# Patient Record
Sex: Female | Born: 1935 | Race: White | Hispanic: No | Smoking: Never smoker
Health system: Southern US, Community
[De-identification: ages and names within clinical notes are randomized; demographics above are authoritative.]

## PROBLEM LIST (undated history)

## (undated) DIAGNOSIS — I350 Nonrheumatic aortic (valve) stenosis: Secondary | ICD-10-CM

## (undated) DIAGNOSIS — I2510 Atherosclerotic heart disease of native coronary artery without angina pectoris: Secondary | ICD-10-CM

## (undated) DIAGNOSIS — N183 Chronic kidney disease, stage 3 unspecified: Secondary | ICD-10-CM

## (undated) DIAGNOSIS — E785 Hyperlipidemia, unspecified: Secondary | ICD-10-CM

## (undated) DIAGNOSIS — S72141A Displaced intertrochanteric fracture of right femur, initial encounter for closed fracture: Secondary | ICD-10-CM

## (undated) DIAGNOSIS — S32000A Wedge compression fracture of unspecified lumbar vertebra, initial encounter for closed fracture: Secondary | ICD-10-CM

## (undated) DIAGNOSIS — M109 Gout, unspecified: Secondary | ICD-10-CM

## (undated) DIAGNOSIS — Z7901 Long term (current) use of anticoagulants: Secondary | ICD-10-CM

## (undated) DIAGNOSIS — I5032 Chronic diastolic (congestive) heart failure: Secondary | ICD-10-CM

## (undated) DIAGNOSIS — M1990 Unspecified osteoarthritis, unspecified site: Secondary | ICD-10-CM

## (undated) DIAGNOSIS — I480 Paroxysmal atrial fibrillation: Secondary | ICD-10-CM

## (undated) DIAGNOSIS — M4856XA Collapsed vertebra, not elsewhere classified, lumbar region, initial encounter for fracture: Secondary | ICD-10-CM

## (undated) DIAGNOSIS — H3530 Unspecified macular degeneration: Secondary | ICD-10-CM

## (undated) DIAGNOSIS — I10 Essential (primary) hypertension: Secondary | ICD-10-CM

## (undated) DIAGNOSIS — R011 Cardiac murmur, unspecified: Secondary | ICD-10-CM

## (undated) DIAGNOSIS — J189 Pneumonia, unspecified organism: Secondary | ICD-10-CM

## (undated) DIAGNOSIS — S72001A Fracture of unspecified part of neck of right femur, initial encounter for closed fracture: Secondary | ICD-10-CM

## (undated) HISTORY — DX: Essential (primary) hypertension: I10

## (undated) HISTORY — DX: Chronic kidney disease, stage 3 unspecified: N18.30

## (undated) HISTORY — DX: Fracture of unspecified part of neck of right femur, initial encounter for closed fracture: S72.001A

## (undated) HISTORY — DX: Long term (current) use of anticoagulants: Z79.01

## (undated) HISTORY — DX: Chronic diastolic (congestive) heart failure: I50.32

## (undated) HISTORY — DX: Chronic kidney disease, stage 3 (moderate): N18.3

## (undated) HISTORY — DX: Hyperlipidemia, unspecified: E78.5

## (undated) HISTORY — PX: TONSILLECTOMY: SUR1361

## (undated) HISTORY — DX: Paroxysmal atrial fibrillation: I48.0

## (undated) HISTORY — DX: Atherosclerotic heart disease of native coronary artery without angina pectoris: I25.10

## (undated) HISTORY — DX: Unspecified macular degeneration: H35.30

## (undated) HISTORY — DX: Nonrheumatic aortic (valve) stenosis: I35.0

---

## 1998-03-28 HISTORY — PX: CHOLECYSTECTOMY: SHX55

## 2007-04-06 ENCOUNTER — Ambulatory Visit: Payer: Self-pay | Admitting: Internal Medicine

## 2007-04-28 ENCOUNTER — Encounter: Payer: Self-pay | Admitting: Internal Medicine

## 2007-05-26 ENCOUNTER — Encounter: Payer: Self-pay | Admitting: Internal Medicine

## 2007-07-12 ENCOUNTER — Ambulatory Visit: Payer: Self-pay | Admitting: Internal Medicine

## 2007-11-16 ENCOUNTER — Encounter: Payer: Self-pay | Admitting: Internal Medicine

## 2007-11-30 ENCOUNTER — Encounter: Payer: Self-pay | Admitting: Internal Medicine

## 2007-12-20 ENCOUNTER — Ambulatory Visit: Payer: Self-pay | Admitting: Internal Medicine

## 2007-12-26 ENCOUNTER — Encounter: Payer: Self-pay | Admitting: Internal Medicine

## 2008-03-05 ENCOUNTER — Emergency Department: Payer: Self-pay | Admitting: Emergency Medicine

## 2008-08-23 ENCOUNTER — Ambulatory Visit: Payer: Self-pay | Admitting: Internal Medicine

## 2008-12-18 ENCOUNTER — Ambulatory Visit: Payer: Self-pay | Admitting: Internal Medicine

## 2009-02-05 ENCOUNTER — Ambulatory Visit: Payer: Self-pay | Admitting: Internal Medicine

## 2009-03-09 ENCOUNTER — Encounter: Payer: Self-pay | Admitting: Cardiovascular Disease

## 2009-03-09 LAB — CONVERTED CEMR LAB: Prothrombin Time: 20.2 s

## 2009-03-22 ENCOUNTER — Encounter: Payer: Self-pay | Admitting: Cardiovascular Disease

## 2009-03-28 ENCOUNTER — Encounter: Payer: Self-pay | Admitting: Cardiovascular Disease

## 2009-03-28 LAB — CONVERTED CEMR LAB: INR: 1.9

## 2009-04-05 ENCOUNTER — Encounter: Payer: Self-pay | Admitting: Cardiovascular Disease

## 2009-04-18 ENCOUNTER — Encounter: Payer: Self-pay | Admitting: Cardiovascular Disease

## 2009-04-20 ENCOUNTER — Ambulatory Visit: Payer: Self-pay | Admitting: Cardiovascular Disease

## 2009-04-20 DIAGNOSIS — E785 Hyperlipidemia, unspecified: Secondary | ICD-10-CM | POA: Insufficient documentation

## 2009-04-20 DIAGNOSIS — R079 Chest pain, unspecified: Secondary | ICD-10-CM

## 2009-04-20 DIAGNOSIS — I4891 Unspecified atrial fibrillation: Secondary | ICD-10-CM | POA: Insufficient documentation

## 2009-04-20 DIAGNOSIS — I10 Essential (primary) hypertension: Secondary | ICD-10-CM

## 2009-04-20 DIAGNOSIS — R609 Edema, unspecified: Secondary | ICD-10-CM

## 2009-05-10 ENCOUNTER — Encounter: Payer: Self-pay | Admitting: Cardiovascular Disease

## 2009-05-10 ENCOUNTER — Ambulatory Visit: Payer: Self-pay

## 2009-05-16 ENCOUNTER — Telehealth: Payer: Self-pay | Admitting: Cardiovascular Disease

## 2009-05-21 ENCOUNTER — Encounter: Payer: Self-pay | Admitting: Cardiovascular Disease

## 2009-05-22 ENCOUNTER — Encounter: Payer: Self-pay | Admitting: Cardiovascular Disease

## 2009-05-23 ENCOUNTER — Telehealth: Payer: Self-pay | Admitting: Cardiovascular Disease

## 2009-05-24 ENCOUNTER — Ambulatory Visit: Payer: Self-pay | Admitting: Internal Medicine

## 2009-05-28 ENCOUNTER — Ambulatory Visit: Payer: Self-pay | Admitting: Cardiovascular Disease

## 2009-05-31 ENCOUNTER — Telehealth: Payer: Self-pay | Admitting: Cardiovascular Disease

## 2009-06-14 ENCOUNTER — Ambulatory Visit: Payer: Self-pay | Admitting: Cardiology

## 2009-06-14 ENCOUNTER — Telehealth: Payer: Self-pay | Admitting: Cardiovascular Disease

## 2009-06-18 ENCOUNTER — Telehealth: Payer: Self-pay | Admitting: Cardiovascular Disease

## 2009-06-20 ENCOUNTER — Telehealth: Payer: Self-pay | Admitting: Cardiovascular Disease

## 2009-06-25 ENCOUNTER — Encounter: Payer: Self-pay | Admitting: Cardiovascular Disease

## 2009-06-26 ENCOUNTER — Telehealth: Payer: Self-pay | Admitting: Cardiovascular Disease

## 2009-06-27 ENCOUNTER — Encounter: Payer: Self-pay | Admitting: Cardiovascular Disease

## 2009-06-28 ENCOUNTER — Ambulatory Visit: Payer: Self-pay | Admitting: Cardiovascular Disease

## 2009-07-02 ENCOUNTER — Encounter: Payer: Self-pay | Admitting: Cardiovascular Disease

## 2009-07-03 ENCOUNTER — Encounter: Payer: Self-pay | Admitting: Cardiovascular Disease

## 2009-07-03 ENCOUNTER — Ambulatory Visit: Payer: Self-pay

## 2009-07-26 ENCOUNTER — Inpatient Hospital Stay: Payer: Self-pay | Admitting: Internal Medicine

## 2009-07-26 ENCOUNTER — Ambulatory Visit: Payer: Self-pay | Admitting: Cardiovascular Disease

## 2009-07-28 ENCOUNTER — Encounter: Payer: Self-pay | Admitting: Pulmonary Disease

## 2009-07-28 ENCOUNTER — Encounter: Payer: Self-pay | Admitting: Cardiovascular Disease

## 2009-07-30 ENCOUNTER — Encounter: Payer: Self-pay | Admitting: Cardiovascular Disease

## 2009-08-01 ENCOUNTER — Encounter: Payer: Self-pay | Admitting: Cardiovascular Disease

## 2009-08-01 LAB — CONVERTED CEMR LAB: POC INR: 3.6

## 2009-08-02 ENCOUNTER — Encounter: Payer: Self-pay | Admitting: Cardiovascular Disease

## 2009-08-03 ENCOUNTER — Telehealth: Payer: Self-pay | Admitting: Cardiovascular Disease

## 2009-08-06 ENCOUNTER — Encounter: Payer: Self-pay | Admitting: Cardiovascular Disease

## 2009-08-10 ENCOUNTER — Telehealth: Payer: Self-pay | Admitting: Cardiovascular Disease

## 2009-08-16 ENCOUNTER — Ambulatory Visit: Payer: Self-pay | Admitting: Pulmonary Disease

## 2009-08-16 ENCOUNTER — Encounter: Payer: Self-pay | Admitting: Pulmonary Disease

## 2009-08-16 DIAGNOSIS — R0602 Shortness of breath: Secondary | ICD-10-CM

## 2009-08-23 ENCOUNTER — Ambulatory Visit: Payer: Self-pay | Admitting: Cardiovascular Disease

## 2009-08-23 ENCOUNTER — Encounter: Payer: Self-pay | Admitting: Cardiovascular Disease

## 2009-08-24 ENCOUNTER — Encounter: Payer: Self-pay | Admitting: Cardiovascular Disease

## 2009-09-12 ENCOUNTER — Ambulatory Visit: Payer: Self-pay | Admitting: Cardiovascular Disease

## 2009-10-17 ENCOUNTER — Ambulatory Visit: Payer: Self-pay | Admitting: Cardiovascular Disease

## 2009-11-15 ENCOUNTER — Ambulatory Visit: Payer: Self-pay | Admitting: Cardiovascular Disease

## 2009-11-15 LAB — CONVERTED CEMR LAB: INR: 3

## 2009-12-12 ENCOUNTER — Ambulatory Visit: Payer: Self-pay | Admitting: Internal Medicine

## 2009-12-12 LAB — CONVERTED CEMR LAB: POC INR: 2.4

## 2010-01-02 ENCOUNTER — Ambulatory Visit: Payer: Self-pay | Admitting: Cardiology

## 2010-01-02 LAB — CONVERTED CEMR LAB: POC INR: 2.6

## 2010-01-09 ENCOUNTER — Ambulatory Visit: Payer: Self-pay | Admitting: Ophthalmology

## 2010-01-30 ENCOUNTER — Ambulatory Visit: Payer: Self-pay | Admitting: Cardiovascular Disease

## 2010-01-30 LAB — CONVERTED CEMR LAB: POC INR: 2.2

## 2010-02-08 ENCOUNTER — Encounter: Payer: Self-pay | Admitting: Cardiovascular Disease

## 2010-02-08 ENCOUNTER — Ambulatory Visit: Payer: Self-pay | Admitting: Cardiovascular Disease

## 2010-02-13 ENCOUNTER — Encounter: Payer: Self-pay | Admitting: Cardiovascular Disease

## 2010-02-19 ENCOUNTER — Encounter: Payer: Self-pay | Admitting: Cardiovascular Disease

## 2010-02-21 ENCOUNTER — Telehealth: Payer: Self-pay | Admitting: Cardiovascular Disease

## 2010-03-01 ENCOUNTER — Ambulatory Visit: Admission: RE | Admit: 2010-03-01 | Discharge: 2010-03-01 | Payer: Self-pay | Source: Home / Self Care

## 2010-03-04 ENCOUNTER — Telehealth: Payer: Self-pay | Admitting: Cardiovascular Disease

## 2010-03-26 NOTE — Medication Information (Signed)
Summary: CCR/NE  Anticoagulant Therapy  Managed by: Cloyde Reams, RN, BSN Referring MD: Mariah Milling PCP: Levell July Supervising MD: Mariah Milling Indication 1: Atrial Fibrillation Lab Used: [facility] Site: [place] INR POC 2.4 INR RANGE 2.0-3.0  Dietary changes: no    Health status changes: no    Bleeding/hemorrhagic complications: no    Recent/future hospitalizations: no    Any changes in medication regimen? no    Recent/future dental: no  Any missed doses?: yes       Is patient compliant with meds? yes       Allergies: No Known Drug Allergies  Anticoagulation Management History:      The patient is taking warfarin and comes in today for a routine follow up visit.  Positive risk factors for bleeding include an age of 75 years or older.  The bleeding index is 'intermediate risk'.  Positive CHADS2 values include History of HTN and Age > 51 years old.  Her last INR was 2.0.  Anticoagulation responsible provider: Fatimah Sundquist.  INR POC: 2.4.  Cuvette Lot#: [id number].  Exp: 09/2010.    Anticoagulation Management Assessment/Plan:      The target INR is 2.0-3.0.  The next INR is due 10/17/2009.  Anticoagulation instructions were given to patient.  Results were reviewed/authorized by Cloyde Reams, RN, BSN.  She was notified by Cloyde Reams RN.         Prior Anticoagulation Instructions: INR 2.4  Continue taking 1 tab daily except for 1.5 on Wednesday. Recheck in 3 weeks.   Current Anticoagulation Instructions: INR 2.4  Continue same dosage 2mg  daily except 3mg  on Wednesdays.  Recheck in 4 weeks.

## 2010-03-26 NOTE — Assessment & Plan Note (Signed)
Summary: consult for dyspnea   Copy to:  Duncan Dull Primary Provider/Referring Provider:  Levell July  CC:  Pulmonary Consult.  History of Present Illness: The pt comes in today evaluation of hypoxemia and dyspnea.  She was recently in the hospital at [facility] the beginning of the month for cough, dyspnea, and wheezing.  She was found to have hypoxemia, and was treated as an inpt for 3 days.  I do not have those records regarding treatment or diagnoses.  Since being out of the hospital, she feels that she is much improved and getting stronger.  She has minimal cough, no congestion, and no mucus.  She feels that her breathing is near her usual baseline.  She denies any issues with her ADL's or groceries, but does get winded with heavy exertion.  She has noted some LE edema.  She has no prior h/o lung disease, but does have afib and h/o chf.  Her recent echo shows nl EF with ?diastolic dysfunction, mild LA dilatation, mild MR, moderate AS, and mild increase in PA pressures.  I have a cxr report from 6/3 which shows hyperinflation and question raised re: thickened IS markings.    Current Medications (verified): 1)  Amlodipine Besylate 5 Mg Tabs (Amlodipine Besylate) .... Take One Tablet By Mouth Daily 2)  Simvastatin 80 Mg Tabs (Simvastatin) .... Take One Tablet By Mouth Daily At Bedtime 3)  Warfarin Sodium 2.5 Mg Tabs (Warfarin Sodium) .... Use As Directed By Anticoagualtion Clinic 4)  Furosemide 40 Mg Tabs (Furosemide) .... Take One Tablet By Mouth Daily 5)  Propafenone Hcl 225 Mg Tabs (Propafenone Hcl) .... Three Times A Day 6)  Metoprolol Succinate 50 Mg Xr24h-Tab (Metoprolol Succinate) .... Take One Tablet By Mouth Daily 7)  Ocuvite  Tabs (Multiple Vitamins-Minerals) .... Once Daily 8)  Vitamin D 400 Unit  Tabs (Cholecalciferol) .... Once Daily 9)  Gabapentin 300 Mg Caps (Gabapentin) .... As Needed 10)  Alendronate Sodium 70 Mg Tabs (Alendronate Sodium) .... Weekly  Allergies  (verified): No Known Drug Allergies  Past History:  Past Medical History: Reviewed history from 05/10/2009 and no changes required. Atrial Fibrillation Hypertension Hyperlipidemia Mild to moderate aortic stenosis with estimated valve area of 1.2 cm in 2007 Macular degeneration Chronic renal insufficency  Past Surgical History: Cholecystectomy Mar 28, 1998  Family History: Reviewed history from 05/10/2009 and no changes required. 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 Tobacco Use - Never. Alcohol Use - yes Regular Exercise - no Drug Use - no  Vital Signs:  Patient profile:   77 year old female Height:      65 inches Weight:      171 pounds BMI:     28.56 O2 Sat:      95 % on Room air Temp:     98.2 degrees F oral Pulse rate:   60 / minute BP sitting:   136 / 66  (left arm) Cuff size:   regular  Vitals Entered By: Arman Filter LPN (August 16, 2009 11:59 AM)  O2 Flow:  Room air  Serial Vital Signs/Assessments:  Comments: 12:30pm Ambulatory Pulse Oximetry  Resting; HR__84___    02 Sat___95%ra__  Lap1 (185 feet)   HR_69____   02 Sat__90%ra__ Lap2 (185 feet)   HR_66____   02 Sat___86%ra__    Lap3 (185 feet)   HR_____   02 Sat_____  ___Test Completed without Difficulty _X__Test Stopped due to:  decrease in o2 sats. Arman Filter LPN  August 16, 2009 2:31 PM    By: Arman Filter LPN   CC: Pulmonary Consult Comments Medications reviewed with patient Arman Filter LPN  August 16, 2009 11:59 AM    Physical Exam  General:  ow female in nad frail Eyes:  PERRLA and EOMI.   Nose:  patent without discharge Mouth:  clear Neck:  no jvd, tmg, LN Lungs:  surprisingly, totally clear to auscultation no wheezing or crackles Heart:  rrr, 3/6 sem Abdomen:  soft and nontender, bs+ Extremities:  1+ edema, mild varicosities pulses  intact distally Neurologic:  alert and oriented, moves all 4.   Impression & Recommendations:  Problem # 1:  DYSPNEA (ICD-786.05) the pt is getting over a recent hospitalization and feels that she is much improved.  She is getting her strength back, and denies any cough, congestion, or mucus.  She has excellent sats at rest, but does desat with ambulation here in the office on the last lap.  Her cxr today is clear.  At this point, would lean toward giving the pt more time to recover over the next 4-8 weeks.  If she continues to have issues, we can start her on oxygen with exertion, and can check pfts to look for obstructive lung disease.  She has never smoked, and does not have h/o asthma.  However, she may have an element of senile emphysema.  Obviously, her cardiac disease contributes to this problem as well, and also her frailty.  I have discussed the above with Dr. Darrick Huntsman, who agrees with plan as outlined.  Medications Added to Medication List This Visit: 1)  Furosemide 40 Mg Tabs (Furosemide) .... Take one tablet by mouth daily  Other Orders: Consultation Level IV (35009) T-2 View CXR (71020TC)   Patient Instructions: 1)  will check cxr today, and call you with results 2)  will speak with Dr. Darrick Huntsman about your case, and call you with update.    Appended Document: consult for dyspnea received records from [facility] regarding recent hospitalization....BNP > 3000.  suspect chf more of an issue for her than copd.

## 2010-03-26 NOTE — Medication Information (Signed)
Summary: Pending eye surgery/ needs INR faxed to [facility]  Anticoagulant Therapy  Managed by: Weston Brass, PharmD Referring MD: Mariah Milling PCP: Levell July Supervising MD: Shirlee Latch MD, [place] Indication 1: Atrial Fibrillation Lab Used: [facility] Site: [place] INR POC 2.6 INR RANGE 2.0-3.0   Health status changes: no    Bleeding/hemorrhagic complications: no    Recent/future hospitalizations: no    Any changes in medication regimen? no    Recent/future dental: no  Any missed doses?: no       Is patient compliant with meds? yes       Allergies: No Known Drug Allergies  Anticoagulation Management History:      The patient is taking warfarin and comes in today for a routine follow up visit.  Positive risk factors for bleeding include an age of 75 years or older.  The bleeding index is 'intermediate risk'.  Positive CHADS2 values include History of HTN and Age > 75 years old.  Her last INR was 3.0.  Anticoagulation responsible provider: Shirlee Latch MD, [place].  INR POC: 2.6.  Cuvette Lot#: [id number].  Exp: 11/2010.    Anticoagulation Management Assessment/Plan:      The target INR is 2.0-3.0.  The next INR is due 01/30/2010.  Anticoagulation instructions were given to patient.  Results were reviewed/authorized by Weston Brass, PharmD.  She was notified by Hoy Register, PharmD Candidat.         Prior Anticoagulation Instructions: INR 2.4  Continue on same dosage 1 tablet daily except 1.5 tablets on Wednesdays.  Recheck in 4 weeks.    Current Anticoagulation Instructions: INR 2.6 Continue previous dose of 2 mg everyday except 3 mg on Wednesday Recheck INR in 4 weeks

## 2010-03-26 NOTE — Letter (Signed)
Summary: Medical Record Release  Medical Record Release   Imported By: Harlon Flor 04/24/2009 15:14:49  _____________________________________________________________________  External Attachment:    Type:   Image     Comment:   External Document

## 2010-03-26 NOTE — Medication Information (Signed)
Summary: ccr  Anticoagulant Therapy  Managed by: Cloyde Reams, RN, BSN Referring MD: Mariah Milling PCP: Levell July Supervising MD: Mariah Milling Indication 1: Atrial Fibrillation Lab Used: [facility] Site:  INR RANGE 2.0-3.0  Dietary changes: no    Health status changes: no    Bleeding/hemorrhagic complications: no    Recent/future hospitalizations: no    Any changes in medication regimen? no    Recent/future dental: no  Any missed doses?: no       Is patient compliant with meds? yes       Allergies: No Known Drug Allergies  Anticoagulation Management History:      The patient is taking warfarin and comes in today for a routine follow up visit.  Positive risk factors for bleeding include an age of 4 years or older.  The bleeding index is 'intermediate risk'.  Positive CHADS2 values include History of HTN and Age > 77 years old.  Her last INR was 2.0 and today's INR is 3.0.  Anticoagulation responsible provider: Goll[facility].  Cuvette Lot#: [id number].  Exp: 11/2010.    Anticoagulation Management Assessment/Plan:      The target INR is 2.0-3.0.  The next INR is due 12/12/2009.  Anticoagulation instructions were given to patient.  Results were reviewed/authorized by Cloyde Reams, RN, BSN.  She was notified by Benedict Needy, RN.         Prior Anticoagulation Instructions: INR 3.0  1mg  today, then resume same dosage 2mg  daily except 3mg  on Wednesdays.  Recheck in 4 weeks.    Current Anticoagulation Instructions: INR 3.0  Continue taking 1 tab daily except for 1.5 tabs on Wednesday. Recheck in 4 weeks.

## 2010-03-26 NOTE — Progress Notes (Signed)
Summary: PROBLEMS  Phone Note Call from Patient Call back at Home Phone [phone]   Caller: SELF Call For: Madeline Perez Summary of Call: HAVING IRREGULAR HEARTBEAT-DOES SHE NEED TO BE SEEN? Initial call taken by: Harlon Flor,  June 14, 2009 9:16 AM  Follow-up for Phone Call        heart beat is not steady, feeling it skipping beat, then it gets ok, going on for several days, different from the past episodes. told pt to come in around 1115 to get an ekg to make sure there is no changes. she was good with this.  Follow-up by: Mercer Pod,  June 14, 2009 9:35 AM

## 2010-03-26 NOTE — Miscellaneous (Signed)
Summary: Orders Update  Clinical Lists Changes  Orders: Added new Test order of Venous Duplex Lower Extremity (Venous Duplex Lower) - Signed 

## 2010-03-26 NOTE — Assessment & Plan Note (Signed)
Summary: ROV/GLC   Visit Type:  Follow-up Referring Provider:  Duncan Dull Primary Provider:  Levell July  CC:  Would like to discuss Aortic Stenosis.Madeline Perez  History of Present Illness: Madeline Perez is a 77 year old woman with history of paroxysmal atrial fibrillation, moderate aortic stenosis, chronic lower extremity edema, chronic renal insufficiency, Diastolic dysfunction, pulmonary hypertension who presents for Routine followup.  Madeline Perez was admitted to the hospital in early June for shortness of breath, COPD exacerbation, pneumonia. She states that she feels better now. She is very concerned about her aortic valve and presents today to discuss what she needs to do. She is not very active, sits at home most of the time. Her husband states that she reads too much. He would like her to lose some weight and get out and exercise. INR in the hospital was 4.5 on June 5. Creatinine was 1.7, BUN 31. Creatinine on admission was 1.3 though this did climb with diuresis.BNP was 3248 and her creatinine was 1.38, BUN 21.  echocardiogram done in 07/2009 Moderate aortic valve stenosis with mildly elevated right ventricular systolic pressure consistent with mild pulmonary hypertension. Mean aortic valve gradient is 21 mmHg, peak gradient of 42 mmHg, estimated aortic valve area 1.0 cm, normal LV and RV size and function, mild to moderate aortic valve regurgitation, mildly dilated left atrium and right atrium. Evidence of diastolic relaxation abnormality.   aortic valve area was 1.2 cm in 2007.    Current Medications (verified): 1)  Amlodipine Besylate 5 Mg Tabs (Amlodipine Besylate) .... Take One Tablet By Mouth Daily 2)  Simvastatin 80 Mg Tabs (Simvastatin) .... Take One Tablet By Mouth Daily At Bedtime 3)  Warfarin Sodium 1 Mg Tabs (Warfarin Sodium) .... Use As Directed By Anticoagualtion Clinic 4)  Furosemide 40 Mg Tabs (Furosemide) .... Take One Tablet By Mouth Daily 5)  Propafenone Hcl 225 Mg  Tabs (Propafenone Hcl) .... Three Times A Day 6)  Metoprolol Succinate 50 Mg Xr24h-Tab (Metoprolol Succinate) .... Take One Tablet By Mouth Daily 7)  Ocuvite  Tabs (Multiple Vitamins-Minerals) .... Once Daily 8)  Vitamin D 400 Unit  Tabs (Cholecalciferol) .... Once Daily 9)  Gabapentin 300 Mg Caps (Gabapentin) .... As Needed 10)  Alendronate Sodium 70 Mg Tabs (Alendronate Sodium) .... 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 Tobacco Use - Never. Alcohol Use - yes Regular Exercise - no Drug Use - no  Risk Factors: Exercise: no (04/20/2009)  Risk Factors: Smoking Status: never (04/20/2009)  Review of Systems       The patient complains of dyspnea on exertion and peripheral edema.  The patient denies fever, weight loss, weight gain, vision loss, decreased hearing, hoarseness, chest pain, syncope, prolonged cough, abdominal pain, incontinence, muscle weakness, depression, and enlarged lymph nodes.         Fatigue  Vital Signs:  Patient profile:   77 year old female Height:      65 inches Weight:      172 pounds BMI:     28.73 Pulse  rate:   64 / minute BP sitting:   137 / 68  (right arm) Cuff size:   regular  Vitals Entered By: Bishop Dublin, CMA (August 23, 2009 3:38 PM)  Physical Exam  General:  Well developed, well nourished, in no acute distress. Head:  normocephalic and atraumatic Neck:  Neck  supple, no JVD. No masses, thyromegaly or abnormal cervical nodes. Lungs:  Clear bilaterally to auscultation and percussion. Heart:  Non-displaced PMI, chest non-tender; regular rate and rhythm, S1, S2 with II/VI SEM murmur, no rubs or gallops. Carotid upstroke normal, no bruit.  Pedals normal pulses. 1+ edema, no varicosities. Abdomen:  Bowel sounds positive; abdomen soft and non-tender without masses Msk:  Back normal, normal gait. Muscle strength and tone normal. Pulses:  pulses normal in all 4 extremities Extremities:  No clubbing or cyanosis. Neurologic:  Alert and oriented x 3. Skin:  Intact without lesions or rashes. Psych:  Normal affect.   Impression & Recommendations:  Problem # 1:  AORTIC STENOSIS (ICD-424.1) we talked at length about her aortic stenosis. It is now close to moderate. Aortic valve area of 1 cm. gradient is also consistent with moderate stenosis. She does have renal dysfunction, pulmonary hypertension, diastolic dysfunction. All of these will contribute to her symptoms of shortness of breath and edema. I suspect that we will need to run her creatinine at around 1.5-1.8 to help her edema and breathing.  I suggested that she stay on her same milligram of Lasix and add an additional dose if her weight goes up or she has worsening edema. She is starting to have some slight skin breakdown on her legs.  Her updated medication list for this problem includes:    Furosemide 40 Mg Tabs (Furosemide) .Madeline Perez... Take one tablet by mouth daily    Metoprolol Succinate 50 Mg Xr24h-tab (Metoprolol succinate) .Madeline Perez... Take one tablet by mouth daily  Problem # 2:  HYPERLIPIDEMIA-MIXED (ICD-272.4) Given the new recommendations on simvastatin, we will suggest that she decrease her dose to 20 mg daily. She is on amlodipine as well which lowers the dose. If we do not have good enough  lipid control with the low dose, we could change to Lipitor when it goes generic this winter.  Her updated  medication list for this problem includes:    Simvastatin 80 Mg Tabs (Simvastatin) .Madeline Perez... Take one tablet by mouth daily at bedtime  Problem # 3:  DYSPNEA (ICD-786.05) Her shortness of breath is due to her underlying aortic stenosis, diastolic dysfunction, atrial fibrillation, deconditioned state and pulmonary hypertension. This will be managed with diuresis with close monitoring of her creatinine.  Her updated medication list for this problem includes:    Amlodipine Besylate 5 Mg Tabs (Amlodipine besylate) .Madeline Perez... Take one tablet by mouth daily    Furosemide 40 Mg Tabs (Furosemide) .Madeline Perez... Take one tablet by mouth daily    Metoprolol Succinate 50 Mg Xr24h-tab (Metoprolol succinate) .Madeline Perez... Take one tablet by mouth daily  Patient Instructions: 1)  Your physician recommends that you continue on your current medications as directed. Please refer to the Current Medication list given to you today. 2)  Your physician wants you to follow-up in: 6 months.   You will receive a reminder letter in the mail two months in advance. If you don't receive a letter, please call our office to schedule the follow-up appointment.

## 2010-03-26 NOTE — Miscellaneous (Signed)
  Clinical Lists Changes  Medications: Changed medication from SIMVASTATIN 80 MG TABS (SIMVASTATIN) Take one tablet by mouth daily at bedtime to SIMVASTATIN 20 MG TABS (SIMVASTATIN) Take one tablet by mouth daily at bedtime - Signed Rx of SIMVASTATIN 20 MG TABS (SIMVASTATIN) Take one tablet by mouth daily at bedtime;  #30 x 6;  Signed;  Entered by: Benedict Needy, RN;  Authorized by: Dossie Arbour MD;  Method used: [id number] [address], [place], [place]  [address], Ph: [phone], Fax: [phone]    Prescriptions: SIMVASTATIN 20 MG TABS (SIMVASTATIN) Take one tablet by mouth daily at bedtime  #30 x 6   Entered by:   Benedict Needy, RN   Authorized by:   Dossie Arbour MD   Signed by:   Benedict Needy, RN on 08/24/2009   Method used:   Electronically to        [id number] ([facility]       [address]       [place], [place]  [address]       Ph: [phone]       Fax: [phone]   RxID:   [id number]

## 2010-03-26 NOTE — Progress Notes (Signed)
Summary: PT INR  Phone Note Call from Patient Call back at Home Phone [phone]   Caller: SELF Call For: Eulises Kijowski Summary of Call: PT STATES THAT SHE HAD HER PT INR DRAWN YESTERDAY AT [facility]-I DO NOT SEE WHERE WE HAVE RECEIVED THE RESULTS-MIGHT WANT TO CALL [facility] TO GET THEM Initial call taken by: Harlon Flor,  Jun 26, 2009 12:43 PM

## 2010-03-26 NOTE — Assessment & Plan Note (Signed)
Summary: ekg  Nurse Visit   Allergies: No Known Drug Allergies  Orders Added: 1)  EKG w/ Interpretation [93000]

## 2010-03-26 NOTE — Progress Notes (Signed)
Summary: PT lab draw issue  Phone Note Outgoing Call   Summary of Call: called pt to f/u on PT/ INR lab.  Pt had been to [facility] on 4/7 for labs from PCP and thought that they were drawing a PT as well.  Per [facility] sample was not drawn for PT.  Pt is currently out of town but will have lab drawn when she returns.   Initial call taken by: Charlena Cross, RN, BSN,  June 20, 2009 10:13 AM

## 2010-03-26 NOTE — Medication Information (Signed)
Summary: rov/nb  Anticoagulant Therapy  Managed by: Bethena Midget, RN, BSN Referring MD: Mariah Milling PCP: Levell July Supervising MD: Mariah Milling Indication 1: Atrial Fibrillation Lab Used: [facility] Site: [place] INR POC 2.2 INR RANGE 2.0-3.0  Dietary changes: no    Health status changes: no    Bleeding/hemorrhagic complications: no    Recent/future hospitalizations: no    Any changes in medication regimen? no    Recent/future dental: no  Any missed doses?: no       Is patient compliant with meds? yes       Allergies: No Known Drug Allergies  Anticoagulation Management History:      The patient is taking warfarin and comes in today for a routine follow up visit.  Positive risk factors for bleeding include an age of 70 years or older.  The bleeding index is 'intermediate risk'.  Positive CHADS2 values include History of HTN and Age > 63 years old.  Her last INR was 3.0.  Anticoagulation responsible provider: [facility].  INR POC: 2.2.  Cuvette Lot#: [id number].  Exp: 01/2011.    Anticoagulation Management Assessment/Plan:      The patient's current anticoagulation dose is Warfarin sodium 2 mg tabs: Use as directed by Anticoagualtion Clinic.  The target INR is 2.0-3.0.  The next INR is due 02/27/2010.  Anticoagulation instructions were given to patient.  Results were reviewed/authorized by Bethena Midget, RN, BSN.  She was notified by Bethena Midget, RN, BSN.         Prior Anticoagulation Instructions: INR 2.6 Continue previous dose of 2 mg everyday except 3 mg on Wednesday Recheck INR in 4 weeks   Current Anticoagulation Instructions: INR 2.2 Continue 1 pill everyday except 1.5 pills on Wednesdays. Recheck in 4 weeks.

## 2010-03-26 NOTE — Assessment & Plan Note (Signed)
Summary: rov   Referring Provider:  Mariah Milling Primary Provider:  Levell July  CC:  Patient have no complaint of SOB or chest pain  or discomfort. Patient have being experiencing edema in both ankle and feet. No edema in her hands. Patient have concern about skipping heart beat latter part of the day. When I checked pulse the pulse had a slow beat. Patient declined to take a EKG reading stated she had one 2 week ago here at the office.  History of Present Illness: [name] is a 77 year old woman with history of paroxysmal atrial fibrillation, moderate aortic stenosis, chronic lower extremity edema, chronic renal insufficiency who presents for Routine followup.  she states that she has palpitations in the afternoon. She has not had any atrial fibrillation as far she knows since 2003. She did [facility] a [facility] 2011 and this showed frequent PVCs and occasional PACs but likely caused her palpitations.  She has been taking Lasix every other day and states having significant lower extremity edema. She does not drink much and does not have much salt per her report. She does have problems with incontinence and does not want to take Lasix every day.  echocardiogram done last week is as below; There was mild concentric hypertrophy. Systolic function was low normal. The estimated ejection fraction was in the range of 50% to 55%.  Doppler parameters are consistent with abnormal left ventricular relaxation (grade 1 diastolic dysfunction). Aortic valve: There was moderate stenosis. Mild to  moderate regurgitation. Valve area: 0.98cm 2(VTI). Valve area: 0.98cm 2 (Vmax). The left atrium was moderately dilated. Systolic pressure was mildly elevated consistent with mild pulmonary HTN. PA peak pressure: 35mm Hg.   aortic valve area was 1.2 cm in 2007.   Current Medications (verified): 1)  Amlodipine Besylate 5 Mg Tabs (Amlodipine Besylate) .... Take One Tablet By Mouth  Daily 2)  Simvastatin 80 Mg Tabs (Simvastatin) .... Take One Tablet By Mouth Daily At Bedtime 3)  Warfarin Sodium 2.5 Mg Tabs (Warfarin Sodium) .... Use As Directed By Anticoagualtion Clinic 4)  Furosemide 40 Mg Tabs (Furosemide) .... Take One Tablet By Mouth Daily - On Hold 5)  Propafenone Hcl 225 Mg Tabs (Propafenone Hcl) .... Three Times A Day 6)  Metoprolol Succinate 50 Mg Xr24h-Tab (Metoprolol Succinate) .... Take One Tablet By Mouth Daily 7)  Ocuvite  Tabs (Multiple Vitamins-Minerals) .... Once Daily 8)  Vitamin D 400 Unit  Tabs (Cholecalciferol) .... Once Daily 9)  Gabapentin 300 Mg Caps (Gabapentin) .... As Needed 10)  Alendronate Sodium 70 Mg Tabs (Alendronate Sodium) .... Weekly  Allergies (verified): No Known Drug Allergies  Review of Systems       The patient complains of peripheral edema.  The patient denies fever, weight loss, weight gain, vision loss, decreased hearing, hoarseness, chest pain, syncope, dyspnea on exertion, prolonged cough, abdominal pain, incontinence, muscle weakness, depression, and enlarged lymph nodes.         Palpitations  Vital Signs:  Patient profile:   77 year old female Height:      65 inches Weight:      178.50 pounds Pulse rate:   52 / minute BP sitting:   138 / 62  (left arm) Cuff size:   large  Physical Exam  General:  well-appearing elderly woman in no apparent distress, HEENT exam is benign, oropharynx is clear, neck is supple with no JVP or carotid bruits, heart sounds are regular with S1-S2 and III/VI SEM  at RSB, lungs are clear to auscultation with no wheezes Rales, abdominal exam is benign, 1 + lower extremity edema, neurologic exam is nonfocal, skin is warm and dry. Pulses are equal and symmetrical in his upper and lower extremities.   Impression & Recommendations:  Problem # 1:  EDEMA (ICD-782.3) I suspect her edema is due to mild pulmonary hypertension and I suspect also some component of venous insufficiency. I suggested that  she stay on her Lasix dose every other day with additional doses every day as needed for worsening edema. Some of her presentation may be due to her underlying aortic valve stenosis.  She has a lower extremity venous Doppler study scheduled for next week  Problem # 2:  AORTIC STENOSIS (ICD-424.1) Aortic valve is estimated at moderate stenosis. We will repeat the echocardiogram in one year's time. Continue metoprolol for heart rate control.  Given her frequent PVCs and PACs which she feels in the afternoon, have asked her to move her metoprolol dosing from the late evening to early afternoon or at lunchtime.   Her updated medication list for this problem includes:    Furosemide 40 Mg Tabs (Furosemide) .Marland Kitchen... Take one tablet by mouth daily - on hold    Metoprolol Succinate 50 Mg Xr24h-tab (Metoprolol succinate) .Marland Kitchen... Take one tablet by mouth daily  Problem # 3:  HYPERTENSION, BENIGN (ICD-401.1) blood pressure is well controlled on today's visit. Heart rate is low.(52 bpm) Will continue to monitor her heart rate and can decrease the dose of metoprolol if she becomes symptomatic.  Her updated medication list for this problem includes:    Amlodipine Besylate 5 Mg Tabs (Amlodipine besylate) .Marland Kitchen... Take one tablet by mouth daily    Furosemide 40 Mg Tabs (Furosemide) .Marland Kitchen... Take one tablet by mouth daily - on hold    Metoprolol Succinate 50 Mg Xr24h-tab (Metoprolol succinate) .Marland Kitchen... Take one tablet by mouth daily  Appended Document: rov she is not on aspirin as she is on Coumadin for a history of a atrial fibrillation. She is a nonsmoker.

## 2010-03-26 NOTE — Procedures (Signed)
Summary: Holter and Event  Holter and Event   Imported By: Harlon Flor 06/29/2009 11:54:14  _____________________________________________________________________  External Attachment:    Type:   Image     Comment:   External Document

## 2010-03-26 NOTE — Assessment & Plan Note (Signed)
Summary: NP6/AMD   Visit Type:  new patient Referring Provider:  Mariah Milling Primary Provider:  Levell July  CC:  "sensation" going across chest.  History of Present Illness: Madeline Perez is a 77 year old woman with history of paroxysmal H. fibrillation, aortic stenosis, chronic lower extremity edema, chronic renal insufficiency who presents for evaluation of some left side chest spasm.  She has a difficult time describing her left chest sensations. She states it is superficial and not deep coma is not a chest pain though feels more like a spasm. It is not extra beats. It comes on more nighttime and keeps her awake and she did not feel it during the daytime when she is active and walking around. She is unable to feel it with pushing on the chest and reproduce the pain. She continues to have difficulty sleeping at nighttime and gabapentin 600 mg q.h.s. is not assisting with sleep. She wonders if I know of any other medications. She is also concerned about her edema and her kidney function.  It has been 4 years since she's had an echocardiogram, at that time her aortic valve area was 1.2 cm and 2007. It is uncertain if she has pulmonary hypertension  Preventive Screening-Counseling & Management  Caffeine-Diet-Exercise     Does Patient Exercise: no      Drug Use:  no.    Current Problems (verified): 1)  Hypertension, Benign  (ICD-401.1) 2)  Hyperlipidemia-mixed  (ICD-272.4) 3)  Atrial Fibrillation  (ICD-427.31)  Current Medications (verified): 1)  Amlodipine Besylate 5 Mg Tabs (Amlodipine Besylate) .... Take One Tablet By Mouth Daily 2)  Simvastatin 80 Mg Tabs (Simvastatin) .... Take One Tablet By Mouth Daily At Bedtime 3)  Warfarin Sodium 2.5 Mg Tabs (Warfarin Sodium) .... Use As Directed By Anticoagualtion Clinic 4)  Furosemide 40 Mg Tabs (Furosemide) .... Take One Tablet By Mouth Daily. 5)  Propafenone Hcl 225 Mg Tabs (Propafenone Hcl) .... Three Times A Day 6)  Metoprolol Succinate  50 Mg Xr24h-Tab (Metoprolol Succinate) .... Take One Tablet By Mouth Daily 7)  Ocuvite  Tabs (Multiple Vitamins-Minerals) .... Once Daily 8)  Vitamin D 400 Unit  Tabs (Cholecalciferol) .... Once Daily 9)  Gabapentin 300 Mg Caps (Gabapentin) .... As Needed 10)  Alendronate Sodium 70 Mg Tabs (Alendronate Sodium) .... Weekly  Allergies (verified): No Known Drug Allergies  Past History:  Past Medical History: Last updated: 04/20/2009 Atrial Fibrillation Hypertension Hyperlipidemia Mild to moderate aortic stenosis with estimated valve area of 1.2 cm in 2007  Family History: Last updated: 04/20/2009 CAD in family  Social History: Last updated: 04/20/2009 Retired  Married  Tobacco Use - No.  Alcohol Use - yes Regular Exercise - no Drug Use - no  Risk Factors: Smoking Status: never (04/20/2009)  Past Surgical History: Cholecystectomy  Social History: Retired  Married  Tobacco Use - No.  Alcohol Use - yes Regular Exercise - no Drug Use - no Does Patient Exercise:  no Drug Use:  no  Review of Systems  The patient denies anorexia, fever, weight loss, weight gain, vision loss, decreased hearing, hoarseness, chest pain, syncope, dyspnea on exertion, peripheral edema, prolonged cough, headaches, hemoptysis, abdominal pain, melena, hematochezia, severe indigestion/heartburn, hematuria, incontinence, genital sores, muscle weakness, suspicious skin lesions, transient blindness, difficulty walking, depression, unusual weight change, abnormal bleeding, and enlarged lymph nodes.         "Chest spasms"  New Orders:     1)  T-Protime, Auto ([id number])     2)  Echocardiogram (Echo)  Vital Signs:  Patient profile:   77 year old female Height:      65 inches Weight:      176 pounds BMI:     29.39 Pulse rate:   67 / minute BP sitting:   144 / 60  (left arm) Cuff size:   regular  Vitals Entered By: Hardin Negus, RMA (April 20, 2009 10:54 AM)  Physical  Exam  General:  Elderly woman in no apparent distress, alert and oriented x3, HEENT exam is benign, neck is supple with no JVP or carotid bruits, heart sounds are regular with normal S1 and S2 and III/VI murmur at the right sternal border, lungs are clear, abdominal exam benign, 1 +  lower extremity edema, neurologic exam is nonfocal,  skin is warm and dry.   EKG  Procedure date:  04/20/2009  Findings:      normal sinus rhythm, rate of 67 beats per minute, no significant ST or T wave changes, poor R wave progression through the precordial leads, unable to rule out intraseptal infarct.  Impression & Recommendations:  Problem # 1:  CHEST PAIN-UNSPECIFIED (ICD-786.50) etiology of her chest pain is uncertain. She describes it as more of a spasm and pain. It is superficial and not deep, associated with rest and on exertion. It keeps her awake at night time. It does sound like it might be musculoskeletal but not cardiac but she is concerned about her heart. We ordered a repeat echocardiogram to reevaluate her aortic valve.  Her updated medication list for this problem includes:    Amlodipine Besylate 5 Mg Tabs (Amlodipine besylate) .Marland Kitchen... Take one tablet by mouth daily    Warfarin Sodium 2.5 Mg Tabs (Warfarin sodium) ..... Use as directed by anticoagualtion clinic    Metoprolol Succinate 50 Mg Xr24h-tab (Metoprolol succinate) .Marland Kitchen... Take one tablet by mouth daily  Orders: Echocardiogram (Echo)  Problem # 2:  AORTIC STENOSIS (ICD-424.1) History of valve area of 1.2 cm it 4 years ago. We have ordered an echocardiogram to reevaluate the valve size.  Her updated medication list for this problem includes:    Furosemide 40 Mg Tabs (Furosemide) .Marland Kitchen... Take one tablet by mouth daily.    Metoprolol Succinate 50 Mg Xr24h-tab (Metoprolol succinate) .Marland Kitchen... Take one tablet by mouth daily  Orders: Echocardiogram (Echo)  Problem # 3:  EDEMA (ICD-782.3) etiology of her edema is likely due to chronic venous  stasis on uncertain if she does have pulmonary hypertension. She does have underlying renal insufficiency. The echocardiogram will help determine her biventricular systolic pressures and if she needs additional diuretic.  Problem # 4:  HYPERTENSION, BENIGN (ICD-401.1) blood pressure is reasonable well controlled on today's visit on her current medication regimen.  Her updated medication list for this problem includes:    Amlodipine Besylate 5 Mg Tabs (Amlodipine besylate) .Marland Kitchen... Take one tablet by mouth daily    Furosemide 40 Mg Tabs (Furosemide) .Marland Kitchen... Take one tablet by mouth daily.    Metoprolol Succinate 50 Mg Xr24h-tab (Metoprolol succinate) .Marland Kitchen... Take one tablet by mouth daily  Other Orders: T-Protime, Auto ([id number])  Patient Instructions: 1)  Your physician recommends that you schedule a follow-up appointment in:  2)  Your physician has requested that you have an echocardiogram.  Echocardiography is a painless test that uses sound waves to create images of your heart. It provides your doctor with information about the size and shape of your heart and how well your heart's chambers and valves are working.  This procedure takes  approximately one hour. There are no restrictions for this procedure.

## 2010-03-26 NOTE — Medication Information (Signed)
Summary: Coumadin Clinic  Anticoagulant Therapy  Managed by: Cloyde Reams, RN, BSN Referring MD: Mariah Milling PCP: Levell July Supervising MD: Mariah Milling Indication 1: Atrial Fibrillation Lab Used: [facility] [place] Site: [place] INR POC 2.4 INR RANGE 2.0-3.0   Health status changes: no    Bleeding/hemorrhagic complications: no     Any changes in medication regimen? no    Recent/future dental: no  Any missed doses?: no       Is patient compliant with meds? yes       Allergies: No Known Drug Allergies  Anticoagulation Management History:      The patient is taking warfarin and comes in today for a routine follow up visit.  Positive risk factors for bleeding include an age of 75 years or older.  The bleeding index is 'intermediate risk'.  Positive CHADS2 values include History of HTN and Age > 75 years old.  Her last INR was 2.0.  Anticoagulation responsible provider: Mcgwire Dasaro.  INR POC: 2.4.  Cuvette Lot#: [id number].  Exp: 10/2010.    Anticoagulation Management Assessment/Plan:      The patient's current anticoagulation dose is Warfarin sodium 2.5 mg tabs: Use as directed by Anticoagualtion Clinic.  The target INR is 2.0-3.0.  The next INR is due 09/12/2009.  Anticoagulation instructions were given to patient.  Results were reviewed/authorized by Cloyde Reams, RN, BSN.  She was notified by Benedict Needy, RN.         Prior Anticoagulation Instructions: INR 1.8   Take 1.5 today Then continue on 1 tab daily except 1.5 tab Wednesday. Recheck in 2 weeks.  Appointment made for 08/23/09      Current Anticoagulation Instructions: INR 2.4  Continue taking 1 tab daily except for 1.5 on Wednesday. Recheck in 3 weeks.

## 2010-03-26 NOTE — Progress Notes (Signed)
Summary: PHI  PHI   Imported By: Harlon Flor 04/24/2009 15:15:05  _____________________________________________________________________  External Attachment:    Type:   Image     Comment:   External Document

## 2010-03-26 NOTE — Progress Notes (Signed)
Summary: CALL BACK  Phone Note Call from Patient Call back at Home Phone [phone]   Caller: SELF Call For: [facility] Summary of Call: WOULD LIKE A CALL BACK FROM [facility] ABOUT A REPORT Initial call taken by: Harlon Flor,  August 03, 2009 8:58 AM  Follow-up for Phone Call        Called spoke with pt.  Pt saw Dr Darrick Huntsman yesterday had PT/INR drawn at office, unsure of results.  Dr Darrick Huntsman resumed pt's coumadin, last dose of Levaquin today, pt has 2 more days left of prednisone taper. Per pt Dr Darrick Huntsman would like for [place] to follow pt's coumadin advised pt to resume same dosage previously on 2mg  daily except 3mg  on Wednesdays, and check PT/INR in 1 week.  Pt will have redrawn on Thursday 08/09/09 and will have results sent to [place] to follow.  Follow-up by: Cloyde Reams RN,  August 03, 2009 9:21 AM

## 2010-03-26 NOTE — Progress Notes (Signed)
Summary: Problems  Phone Note Call from Patient Call back at Home Phone [phone]   Caller: Spouse Call For: MD Summary of Call: Patient's spouse called and patient was here last week for EKG for irregular heartbeat.  Husband feels that patient's meds needs to be adjusted due to either PAC's or PVC's, does patient need to come in and been seen. Initial call taken by: West Carbo,  June 18, 2009 9:29 AM  Follow-up for Phone Call        pt states that she had holter with Dr. Gwen Pounds in March.  Will contact their office and obtain copy of results.  Appt made for 5.5 with Dr. Mariah Milling.  Follow-up by: Charlena Cross, RN, BSN,  June 18, 2009 3:05 PM

## 2010-03-26 NOTE — Progress Notes (Signed)
Summary: Question  Phone Note Call from Patient Call back at Home Phone [phone]   Caller: Patient Call For: [place] Summary of Call: Patient called and wanted to know if Dr. Mariah Milling has had a chance to check with Dr. Darrick Huntsman on why she wanted her to have the test done on April 12.  Patient needs to cancel the test for now.  Wants to know if she needs to reschedule or does Dr. Mariah Milling feel that she doesn't need it. Initial call taken by: [place] Carbo,  May 31, 2009 9:32 AM  Follow-up for Phone Call        She shoudl call Dr. Darrick Huntsman. Hvae Tullo read our last office note and then she can make a decision. She ordered the test and she needs to make the decision whether to cancel. I can not cancel her test.

## 2010-03-26 NOTE — Progress Notes (Signed)
Summary: RESULTS  Phone Note Call from Patient Call back at [phone]   Caller: SELF Call For: [facility] Summary of Call: WOULD LIKE RESULTS OF THE ECHO Initial call taken by: Harlon Flor,  May 16, 2009 11:40 AM  Follow-up for Phone Call        Left message to call. Charlena Cross RN BSN   Additional Follow-up for Phone Call Additional follow up Details #1::        pt aware. Charlena Cross RN BSN

## 2010-03-26 NOTE — Medication Information (Signed)
Summary: CCR/AMD  Anticoagulant Therapy  Managed by: Cloyde Reams, RN, BSN Referring MD: Mariah Milling PCP: Levell July Supervising MD: Gala Romney MD, Reuel Boom Indication 1: Atrial Fibrillation Lab Used: [facility] [place] Site: [place] INR POC 2.4 INR RANGE 2.0-3.0  Dietary changes: no    Health status changes: no    Bleeding/hemorrhagic complications: no    Recent/future hospitalizations: no    Any changes in medication regimen? no    Recent/future dental: no  Any missed doses?: no       Is patient compliant with meds? yes       Allergies: No Known Drug Allergies  Anticoagulation Management History:      The patient is taking warfarin and comes in today for a routine follow up visit.  Positive risk factors for bleeding include an age of 75 years or older.  The bleeding index is 'intermediate risk'.  Positive CHADS2 values include History of HTN and Age > 62 years old.  Her last INR was 3.0.  Anticoagulation responsible provider: Bensimhon MD, Reuel Boom.  INR POC: 2.4.  Cuvette Lot#: [id number].  Exp: 12/2010.    Anticoagulation Management Assessment/Plan:      The target INR is 2.0-3.0.  The next INR is due 01/09/2010.  Anticoagulation instructions were given to patient.  Results were reviewed/authorized by Cloyde Reams, RN, BSN.  She was notified by Cloyde Reams RN.         Prior Anticoagulation Instructions: INR 3.0  Continue taking 1 tab daily except for 1.5 tabs on Wednesday. Recheck in 4 weeks.   Current Anticoagulation Instructions: INR 2.4  Continue on same dosage 1 tablet daily except 1.5 tablets on Wednesdays.  Recheck in 4 weeks.

## 2010-03-26 NOTE — Medication Information (Signed)
Summary: rov/ewj  Anticoagulant Therapy  Managed by: Cloyde Reams, RN, BSN Referring MD: Mariah Milling PCP: Levell July Supervising MD: Mariah Milling Indication 1: Atrial Fibrillation Lab Used: [facility] [place] Site: [place] INR POC 3.0 INR RANGE 2.0-3.0  Dietary changes: no    Health status changes: no    Bleeding/hemorrhagic complications: no    Recent/future hospitalizations: no    Any changes in medication regimen? yes       Details: Started on Augmentin and a cream for infection on lip, blistered on 10/11/09.   Recent/future dental: no  Any missed doses?: no       Is patient compliant with meds? yes       Allergies: No Known Drug Allergies  Anticoagulation Management History:      The patient is taking warfarin and comes in today for a routine follow up visit.  Positive risk factors for bleeding include an age of 75 years or older.  The bleeding index is 'intermediate risk'.  Positive CHADS2 values include History of HTN and Age > 75 years old.  Her last INR was 2.0.  Anticoagulation responsible provider: Gollan.  INR POC: 3.0.  Cuvette Lot#: [id number].  Exp: 11/2010.    Anticoagulation Management Assessment/Plan:      The target INR is 2.0-3.0.  The next INR is due 11/14/2009.  Anticoagulation instructions were given to patient.  Results were reviewed/authorized by Cloyde Reams, RN, BSN.  She was notified by Cloyde Reams RN.         Prior Anticoagulation Instructions: INR 2.4  Continue same dosage 2mg  daily except 3mg  on Wednesdays.  Recheck in 4 weeks.    Current Anticoagulation Instructions: INR 3.0  1mg  today, then resume same dosage 2mg  daily except 3mg  on Wednesdays.  Recheck in 4 weeks.

## 2010-03-26 NOTE — Progress Notes (Signed)
Summary: F/U  Phone Note Call from Patient Call back at Home Phone [phone] Call back at [phone]   Caller: SELF Call For: [facility] Summary of Call: PT HAD AN ECHO WHILE IN THE HOSPITAL-WANTS TO KNOW WHEN SHE NEEDS TO FOLLOW UP Initial call taken by: Harlon Flor,  August 10, 2009 10:18 AM  Follow-up for Phone Call        pt aware that she is due for a follow up in November but if she has any problems to call us and let us know.  Follow-up by: Benedict Needy, RN,  August 10, 2009 11:04 AM

## 2010-03-26 NOTE — Miscellaneous (Signed)
Summary: records from [facility]  Clinical Lists Changes   received records from [facility] re: pt's recent hospital stay.  put in your very important look at folder for you to review.  Aundra Millet Reynolds LPN  August 16, 2009 4:18 PM

## 2010-03-26 NOTE — Medication Information (Signed)
Summary: Coumadin Clinic  Anticoagulant Therapy  Managed by: Cloyde Reams, RN, BSN Referring MD: Mariah Milling PCP: Levell July Supervising MD: Mariah Milling Indication 1: Atrial Fibrillation Lab Used: [facility] Site: [place] PT 38.2 INR POC 3.6 INR RANGE 2.0-3.0   Health status changes: yes       Details: Pt was admitted to hospital.     Recent/future hospitalizations: yes       Details: Pt admitted to hospital last week 6/2-6/5 dx with PNA  Any changes in medication regimen? yes       Details: Levaquin and Prednisone in hospital and continues now 2 days left of Levaquin, and cont on prednisone    Any missed doses?: yes     Details: Per Dr Darrick Huntsman hold coumadin, INR to be checked tomorrow and seeing Dr Darrick Huntsman.    Is patient compliant with meds? yes       Allergies: No Known Drug Allergies  Anticoagulation Management History:      Her anticoagulation is being managed by telephone today.  Positive risk factors for bleeding include an age of 75 years or older.  The bleeding index is 'intermediate risk'.  Positive CHADS2 values include History of HTN and Age > 56 years old.  Her last INR was 2.0.  Prothrombin time is 38.2.  Anticoagulation responsible provider: Gollan.  INR POC: 3.6.    Anticoagulation Management Assessment/Plan:      The patient's current anticoagulation dose is Warfarin sodium 2.5 mg tabs: Use as directed by Anticoagualtion Clinic.  The target INR is 2.0-3.0.  The next INR is due 08/08/2009.  Anticoagulation instructions were given to patient.  Results were reviewed/authorized by Cloyde Reams, RN, BSN.  She was notified by Cloyde Reams RN.         Prior Anticoagulation Instructions: INR 2.1  Continue on same dosage 2mg  daily except 3mg  on Wednesdays. Recheck in 4 weeks.    Current Anticoagulation Instructions: INR 3.6  Holding coumadin per Dr Darrick Huntsman, pt d/c from hospital on 07/29/09 on Levaquin and Prednisone with elevated INR secondary to meds.  Seeing Dr Darrick Huntsman  tomorrow and INR check scheduled as well.  Pt will clarify if Dr Darrick Huntsman is going to follow pt's coumadin or if we will be following.

## 2010-03-26 NOTE — Assessment & Plan Note (Signed)
Summary: ROV   Visit Type:  Follow-up Referring Provider:  Mariah Milling Primary Provider:  Levell July  CC:  feeling better than was.  History of Present Illness: Ms. Schmoker is a 77 year old woman with history of paroxysmal atrial fibrillation, moderate aortic stenosis, chronic lower extremity edema, chronic renal insufficiency who presents for evaluation of shortness of breath and weight gain.  She states that over the past 10 days she has stopped her Lasix. She has been troubled by incontinence when she takes Lasix. She presented to Dr. to [employer] office and saw the physician assistant. She described wheezing, shortness of breath and some coughing. A chest x-ray and lab work was done though she is uncertain of the results. Dr. Darrick Huntsman was unable to see her at such short notice and she presents for further evaluation.  She describes increasing shortness of breath particularly with exertion. Increasing lower extremity edema more noticeable on the left lower extremity. She does have a mild cough. Her husband states that she does not walk or exercise much at all.  echocardiogram done last week is as below; There was mild concentric hypertrophy. Systolic function was low normal. The estimated ejection fraction was in the range of 50% to 55%.  Doppler parameters are consistent with abnormal left ventricular relaxation (grade 1 diastolic dysfunction). Aortic valve: There was moderate stenosis. Mild to  moderate regurgitation. Valve area: 0.98cm 2(VTI). Valve area: 0.98cm 2 (Vmax). The left atrium was moderately dilated. Systolic pressure was mildly elevated consistent with mild pulmonary HTN. PA peak pressure: 35mm Hg.    aortic valve area was 1.2 cm in 2007.   Current Problems (verified): 1)  Edema  (ICD-782.3) 2)  Chest Pain-unspecified  (ICD-786.50) 3)  Aortic Stenosis  (ICD-424.1) 4)  Hypertension, Benign  (ICD-401.1) 5)  Hyperlipidemia-mixed  (ICD-272.4) 6)  Atrial Fibrillation   (ICD-427.31)  Current Medications (verified): 1)  Amlodipine Besylate 5 Mg Tabs (Amlodipine Besylate) .... Take One Tablet By Mouth Daily 2)  Simvastatin 80 Mg Tabs (Simvastatin) .... Take One Tablet By Mouth Daily At Bedtime 3)  Warfarin Sodium 2.5 Mg Tabs (Warfarin Sodium) .... Use As Directed By Anticoagualtion Clinic 4)  Furosemide 40 Mg Tabs (Furosemide) .... Take One Tablet By Mouth Daily - On Hold 5)  Propafenone Hcl 225 Mg Tabs (Propafenone Hcl) .... Three Times A Day 6)  Metoprolol Succinate 50 Mg Xr24h-Tab (Metoprolol Succinate) .... Take One Tablet By Mouth Daily 7)  Ocuvite  Tabs (Multiple Vitamins-Minerals) .... Once Daily 8)  Vitamin D 400 Unit  Tabs (Cholecalciferol) .... Once Daily 9)  Gabapentin 300 Mg Caps (Gabapentin) .... As Needed 10)  Alendronate Sodium 70 Mg Tabs (Alendronate Sodium) .... Weekly  Allergies (verified): No Known Drug Allergies  Past History:  Past Medical History: Last updated: 05/10/2009 Atrial Fibrillation Hypertension Hyperlipidemia Mild to moderate aortic stenosis with estimated valve area of 1.2 cm in 2007 Macular degeneration Chronic renal insufficency  Past Surgical History: Last updated: 04/20/2009 Cholecystectomy  Family History: Last updated: 05/10/2009 CAD in family Father: deceased 36 with CAD Mother: deceased 29: CAD Brother: deceased with CAD  Social History: Last updated: 04/20/2009 Retired  Married  Tobacco Use - No.  Alcohol Use - yes Regular Exercise - no Drug Use - no  Risk Factors: Exercise: no (04/20/2009)  Risk Factors: Smoking Status: never (04/20/2009)  Review of Systems       The patient complains of weight gain, dyspnea on exertion, and peripheral edema.  The patient denies fatigue, malaise, fever, weight gain/loss, vision loss,  decreased hearing, hoarseness, chest pain, palpitations, shortness of breath, prolonged cough, wheezing, sleep apnea, coughing up blood, abdominal pain, blood in stool,  nausea, vomiting, diarrhea, heartburn, incontinence, blood in urine, muscle weakness, joint pain, leg swelling, rash, skin lesions, headache, fainting, dizziness, depression, anxiety, enlarged lymph nodes, easy bruising or bleeding, and environmental allergies.         mild cough and wheezing  Vital Signs:  Patient profile:   77 year old female Height:      65 inches Weight:      180.75 pounds BMI:     30.19 Pulse rate:   69 / minute Pulse rhythm:   regular BP sitting:   136 / 73  (left arm) Cuff size:   regular  Vitals Entered By: Mercer Pod (May 28, 2009 11:23 AM)  Physical Exam  General:  elderly woman in no apparent distress, alert and oriented x3, HEENT exam is benign, oropharynx is clear, neck is supple with no JVP or carotid bruits heart sounds are irregular with S1-S2 no murmurs appreciated, lungs are clear to auscultation with no wheezes or rales, abdominal exam is benign, 1+ pitting edema on the left below the knee, trace to 1+ edema on the right lower extremity to below the knee, neurologic exam grossly nonfocal, skin is warm and dry.   Impression & Recommendations:  Problem # 1:  AORTIC STENOSIS (ICD-424.1) recent echocardiogram shows slight worsening of her aortic stenosis. It is still moderate in severity. She has been holding her Lasix for the past 10 days and now has a 5-6 pound weight gain over the past several weeks.  I suspect that the fluid/weight gain is causing her symptoms of shortness of breath. Her lower extremity edema is worse. I discussed this with her and suggested she go back on her Lasix 40 mg daily. She needs to watch her fluid intake as well as her salt intake. The etiology of her edema and shortness of breath and mild pulmonary hypertension on echocardiogram is likely her aortic valve and diastolic dysfunction. She is already on metoprolol which will help with rate control to minimize fluid accumulation.  Her symptoms may get worse in the next  several years due to worsening of her aortic valve stenosis. I have discussed her urinary incontinence with her and suggested that she talk with Dr. Darrick Huntsman. From her details, it sounds like overflow incontinence. She may need to do her bladder on a more frequent basis rather than waiting for the urge to urinate.  Her updated medication list for this problem includes:    Furosemide 40 Mg Tabs (Furosemide) .Marland Kitchen... Take one tablet by mouth daily - on hold    Metoprolol Succinate 50 Mg Xr24h-tab (Metoprolol succinate) .Marland Kitchen... Take one tablet by mouth daily  Problem # 2:  EDEMA (ICD-782.3) She does have mild pulmonary hypertension on her echocardiogram and I suspect she also has some component of venous insufficiency as well. Gentle diuresis and try to figure out her dry weight will help [place] to manage her diuretis. On her last visit, her weight was 5-6 pounds less at approximately 175 pounds.  Problem # 3:  HYPERTENSION, BENIGN (ICD-401.1) blood pressure today is reasonably well controlled and no changes were made other than to add back the diuretic.  Her updated medication list for this problem includes:    Amlodipine Besylate 5 Mg Tabs (Amlodipine besylate) .Marland Kitchen... Take one tablet by mouth daily    Furosemide 40 Mg Tabs (Furosemide) .Marland Kitchen... Take one tablet by  mouth daily - on hold    Metoprolol Succinate 50 Mg Xr24h-tab (Metoprolol succinate) .Marland Kitchen... Take one tablet by mouth daily  Patient Instructions: 1)  Your physician recommends that you schedule a follow-up appointment in: 3 months 2)  Your physician has recommended you make the following change in your medication: restart lasix

## 2010-03-26 NOTE — Progress Notes (Signed)
Summary: Madeline Perez  Phone Note Call from Patient Call back at Home Phone [phone]   Caller: SELF Call For: [facility] Summary of Call: RETURNING MELISSAS CALL Initial call taken by: Harlon Flor,  May 23, 2009 8:15 AM  Follow-up for Phone Call        Left message to call. Charlena Cross RN BSN   pt aware of ccr results. Charlena Cross RN BSN

## 2010-03-28 ENCOUNTER — Other Ambulatory Visit: Payer: Self-pay

## 2010-03-28 ENCOUNTER — Ambulatory Visit: Admit: 2010-03-28 | Payer: Self-pay

## 2010-03-28 NOTE — Letter (Signed)
Summary: BP Readings  BP Readings   Imported By: Harlon Flor 02/27/2010 15:27:26  _____________________________________________________________________  External Attachment:    Type:   Image     Comment:   External Document  Appended Document: BP Readings BP looks a little high.  We could start clonidine 0.1 mg BID for HTN If she wants to start slow, could cut in 1/2 to start  Appended Document: BP Readings Notified patient need to start on clonidine 0.1 mg two times a day for HTN, told her if she wanted could cut in 1/2 to start.  She will take the full tablet and let us know if has any problems with that dose. Rx called to [facility]   Prescriptions: CLONIDINE HCL 0.1 MG TABS (CLONIDINE HCL) one tablet two times a day  #60 x 3   Entered by:   Bishop Dublin, CMA   Authorized by:   Dossie Arbour MD   Signed by:   Bishop Dublin, CMA on 02/28/2010   Method used:   Electronically to        [id number] (ret[facility])       [address]       [place], [place]  [address]       Ph: [phone]       Fax: [phone]   RxID:   [id number]

## 2010-03-28 NOTE — Progress Notes (Signed)
Summary: Edema/ d/c Amlodipine  Phone Note Outgoing Call   Call placed by: Lanny Hurst RN,  February 21, 2010 4:32 PM Summary of Call: Called pt to f/u after pt stopped Amlodipine due to edema. Pt states she has been off Amlodipine >1 week now and the swelling has decreased and her BP has generally stayed the same. Initial call taken by: Lanny Hurst RN,  February 21, 2010 4:33 PM

## 2010-03-28 NOTE — Progress Notes (Signed)
Summary: BP medication  Phone Note Call from Patient Call back at Home Phone [phone]   Caller: Self Call For: Gollan Summary of Call: Pt is having difficulty with the changes in BP medication.  Pt felt weak and faint.  Top number was 95. Initial call taken by: Harlon Flor,  March 04, 2010 9:27 AM  Follow-up for Phone Call        Pt states after changing from amlodipine to clonidine 0.1mg  two times a day her systolic BP has dropped greatly from 175 to 104 and 95 yesterday. Pt has also had dizziness with these readings. Pt stopped evening dose Sunday night.  Notified Dr. Mariah Milling, advised pt to cut clonidine in half and take two times a day and monitor bp's and let us know how this works. Pt is ok with this.  Follow-up by: Lanny Hurst RN,  March 05, 2010 2:36 PM

## 2010-03-28 NOTE — Medication Information (Signed)
Summary: rov/tm  Anticoagulant Therapy  Managed by: Lanny Hurst, RN Referring MD: Mariah Milling PCP: Duncan Dull Supervising MD: Mariah Milling Indication 1: Atrial Fibrillation Lab Used: [facility] Site: [place] INR POC 2.9 INR RANGE 2.0-3.0  Dietary changes: yes       Details: less vitk intake than normal  Health status changes: no    Bleeding/hemorrhagic complications: no    Recent/future hospitalizations: no    Any changes in medication regimen? yes       Details: changed amlodipine to clonidine  Recent/future dental: no  Any missed doses?: no       Is patient compliant with meds? yes       Allergies: No Known Drug Allergies  Anticoagulation Management History:      The patient is taking warfarin and comes in today for a routine follow up visit.  Positive risk factors for bleeding include an age of 75 years or older.  The bleeding index is 'intermediate risk'.  Positive CHADS2 values include History of HTN and Age > 75 years old.  Her last INR was 3.0.  Anticoagulation responsible provider: Leo Fray.  INR POC: 2.9.  Exp: 01/2011.    Anticoagulation Management Assessment/Plan:      The patient's current anticoagulation dose is Warfarin sodium 2 mg tabs: Use as directed by Anticoagualtion Clinic.  The target INR is 2.0-3.0.  The next INR is due 03/29/2010.  Anticoagulation instructions were given to patient.  Results were reviewed/authorized by Lanny Hurst, RN.  She was notified by Lanny Hurst RN.         Prior Anticoagulation Instructions: INR 2.2 Continue 1 pill everyday except 1.5 pills on Wednesdays. Recheck in 4 weeks.   Current Anticoagulation Instructions: INR 2.9  Advised pt to incorporate green leafy vegetables back in diet consistently. Continue same dosage Coumadin 2mg  1 tablet daily except 1 1/2 tablets on Wednesdays. Recheck in 4 weeks.

## 2010-03-28 NOTE — Miscellaneous (Signed)
Summary: Med change  Clinical Lists Changes  Medications: Changed medication from VITAMIN D 400 UNIT  TABS (CHOLECALCIFEROL) once daily to VITAMIN D3 1000000 UNIT/GM LIQD (CHOLECALCIFEROL) 1000 mg IV Removed medication of AMLODIPINE BESYLATE 5 MG TABS (AMLODIPINE BESYLATE) Take one tablet by mouth daily Removed medication of GABAPENTIN 300 MG CAPS (GABAPENTIN) as needed

## 2010-03-28 NOTE — Assessment & Plan Note (Signed)
Summary: F/U 6 MONTHS   Visit Type:  Follow-up Referring Provider:  Duncan Dull Primary Provider:  Duncan Dull  CC:  c/o "pulse skipping". Denies chest pain and SOB.  History of Present Illness: Madeline Perez is a 77 year old woman with history of paroxysmal atrial fibrillation, moderate aortic stenosis, chronic lower extremity edema, chronic renal insufficiency, Diastolic dysfunction, pulmonary hypertension who presents for routine followup.  Madeline Perez was admitted to the hospital in early June for shortness of breath, COPD exacerbation, pneumonia.  Creatinine on admission was 1.3 though this did climb with diuresis.BNP was 3248 and her creatinine was 1.38, BUN 21.  Recently, she reports that she has been feeling well. She denies any tachycardia palpitations. Her edema has been mild though stable. Her breathing has been average. She is actually surprised that she has no significant symptoms for the past several weeks.   echocardiogram done in 07/2009 Moderate aortic valve stenosis with mildly elevated right ventricular systolic pressure consistent with mild pulmonary hypertension. Mean aortic valve gradient is 21 mmHg, peak gradient of 42 mmHg, estimated aortic valve area 1.0 cm, normal LV and RV size and function, mild to moderate aortic valve regurgitation, mildly dilated left atrium and right atrium. Evidence of diastolic relaxation abnormality.   aortic valve area was 1.2 cm in 2007.   EKG shows normal sinus rhythm with rate of 64 beats per minute, borderline left axis deviation, unable to rule out anteroseptal infarct  Current Medications (verified): 1)  Amlodipine Besylate 5 Mg Tabs (Amlodipine Besylate) .... Take One Tablet By Mouth Daily 2)  Simvastatin 20 Mg Tabs (Simvastatin) .... Take One Tablet By Mouth Daily At Bedtime 3)  Warfarin Sodium 2 Mg Tabs (Warfarin Sodium) .... Use As Directed By Anticoagualtion Clinic 4)  Furosemide 40 Mg Tabs (Furosemide) .... Take One  Tablet By Mouth Daily 5)  Propafenone Hcl 225 Mg Tabs (Propafenone Hcl) .... Three Times A Day 6)  Metoprolol Succinate 50 Mg Xr24h-Tab (Metoprolol Succinate) .... Take One Tablet By Mouth Daily 7)  Ocuvite  Tabs (Multiple Vitamins-Minerals) .... Once Daily 8)  Vitamin D 400 Unit  Tabs (Cholecalciferol) .... Once Daily 9)  Gabapentin 300 Mg Caps (Gabapentin) .... As Needed 10)  Alendronate Sodium 70 Mg Tabs (Alendronate Sodium) .... Weekly 11)  Mirapex 0.5 Mg Tabs (Pramipexole Dihydrochloride) .... 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 Tobacco Use - Never. Alcohol Use - yes Regular Exercise - no Drug Use - no  Risk Factors: Exercise: no (04/20/2009)  Risk Factors: Smoking Status: never (04/20/2009)  Review of Systems       The patient complains of peripheral edema.  The patient denies fever, weight loss, weight gain, vision loss, decreased hearing, hoarseness, chest pain, syncope, dyspnea on exertion, prolonged cough, abdominal pain, incontinence, muscle weakness, depression, and enlarged lymph nodes.    Vital Signs:  Patient profile:   77 year old female Height:      65 inches Weight:      168.50 pounds BMI:  28.14 Pulse rate:   64 / minute BP sitting:   102 / 60  (left arm) Cuff size:   regular  Vitals Entered By: Lysbeth Galas CMA (February 08, 2010 3:19  PM)  Physical Exam  General:  Well developed, well nourished, in no acute distress. frail, thin woman Head:  normocephalic and atraumatic Neck:  Neck supple, no JVD. No masses, thyromegaly or abnormal cervical nodes. Lungs:  Clear bilaterally to auscultation and percussion. Heart:  Non-displaced PMI, chest non-tender; regular rate and rhythm, S1, S2 with II/VI SEM murmur, no rubs or gallops. Carotid upstroke normal, no bruit.  Pedals normal pulses. Trace to 1+ edema of the LE, no varicosities. Abdomen:  Bowel sounds positive; abdomen soft and non-tender without masses Msk:  Back normal, normal gait. Muscle strength and tone normal. Pulses:  pulses normal in all 4 extremities Extremities:  No clubbing or cyanosis. Neurologic:  Alert and oriented x 3. Skin:  Intact without lesions or rashes. Psych:  Normal affect.   Impression & Recommendations:  Problem # 1:  AORTIC STENOSIS (ICD-424.1) moderate aortic stenosis. We can reevaluate this on an annual basis with echocardiography. Currently appears to be stable no significant shortness breath or chest pain.  Her updated medication list for this problem includes:    Furosemide 40 Mg Tabs (Furosemide) .Marland Kitchen... Take one tablet by mouth daily    Metoprolol Succinate 50 Mg Xr24h-tab (Metoprolol succinate) .Marland Kitchen... Take one tablet by mouth daily  Problem # 2:  EDEMA (ICD-782.3) Her edema is likely multifactorial; from her diastolic dysfunction, aortic stenosis, venous insufficiency. I am also concerned about her amlodipine as calcium channel blockers can exacerbate venous insufficiency. We have suggested that she stay on her current dose of diuretic. I suspect that if we keep her mildly hypovolemic, it would limit the number of episodes of atrial fibrillation. I will discuss with her whether she would like to change her amlodipine to an alternate. Options are limited given her history of renal insufficiency. her blood pressure is low today and perhaps  we can stop amlodipine without a replacement.  Problem # 3:  HYPERTENSION, BENIGN (ICD-401.1) blood pressure is well-controlled on her current medication regimen.  Her updated medication list for this problem includes:    Amlodipine Besylate 5 Mg Tabs (Amlodipine besylate) .Marland Kitchen... Take one tablet by mouth daily    Furosemide 40 Mg Tabs (Furosemide) .Marland Kitchen... Take one tablet by mouth daily    Metoprolol Succinate 50 Mg Xr24h-tab (Metoprolol succinate) .Marland Kitchen... Take one tablet by mouth daily  Problem # 4:  HYPERLIPIDEMIA-MIXED (ICD-272.4) Continue her on her current dose of simvastatin. No known coronary artery disease.  Her updated medication list for this problem includes:    Simvastatin 20 Mg Tabs (Simvastatin) .Marland Kitchen... Take one tablet by mouth daily at bedtime  Patient Instructions: 1)  Your physician recommends that you schedule a follow-up appointment in: 6 month 2)  Your physician recommends that you continue on your current medications as directed. Please refer to the Current Medication list given to you today.

## 2010-03-29 ENCOUNTER — Encounter: Payer: Self-pay | Admitting: Cardiovascular Disease

## 2010-03-29 ENCOUNTER — Other Ambulatory Visit (INDEPENDENT_AMBULATORY_CARE_PROVIDER_SITE_OTHER): Payer: Medicare Other

## 2010-03-29 DIAGNOSIS — Z7901 Long term (current) use of anticoagulants: Secondary | ICD-10-CM

## 2010-03-29 DIAGNOSIS — I4891 Unspecified atrial fibrillation: Secondary | ICD-10-CM

## 2010-04-01 ENCOUNTER — Telehealth: Payer: Self-pay | Admitting: Cardiovascular Disease

## 2010-04-02 ENCOUNTER — Encounter: Payer: Self-pay | Admitting: Cardiovascular Disease

## 2010-04-03 NOTE — Medication Information (Addendum)
Summary: Coumadin Clinic  Anticoagulant Therapy  Managed by: Lanny Hurst, RN Referring MD: Mariah Milling PCP: Duncan Dull Supervising MD: Mariah Milling Indication 1: Atrial Fibrillation Lab Used: [facility] Site: [place] INR POC 4.3 INR RANGE 2.0-3.0  Dietary changes: no    Health status changes: yes       Details: Recent gout flare up. Tx with Prednisone.  Bleeding/hemorrhagic complications: no    Recent/future hospitalizations: no    Any changes in medication regimen? yes       Details: Started prednisone (6 days, on day 4 now), changed Clonidine to Amlodipine, Simva to Crestor  Recent/future dental: no  Any missed doses?: no       Is patient compliant with meds? yes       Allergies: No Known Drug Allergies  Anticoagulation Management History:      The patient is taking warfarin and comes in today for a routine follow up visit.  Positive risk factors for bleeding include an age of 21 years or older.  The bleeding index is 'intermediate risk'.  Positive CHADS2 values include History of HTN and Age > 14 years old.  Her last INR was 3.0.  Anticoagulation responsible provider: Gollan.  INR POC: 4.3.  Exp: 01/2011.    Anticoagulation Management Assessment/Plan:      The patient's current anticoagulation dose is Warfarin sodium 2 mg tabs: Use as directed by Anticoagualtion Clinic.  The target INR is 2.0-3.0.  The next INR is due 04/10/2010.  Anticoagulation instructions were given to patient.  Results were reviewed/authorized by Lanny Hurst, RN.  She was notified by Lanny Hurst RN.         Prior Anticoagulation Instructions: INR 2.9  Advised pt to incorporate green leafy vegetables back in diet consistently. Continue same dosage Coumadin 2mg  1 tablet daily except 1 1/2 tablets on Wednesdays. Recheck in 4 weeks.  Current Anticoagulation Instructions: INR 4.3  Hold today and tomorrow's dose. Then continue same dosage 1 tablet every day except 1 1/2 tablets on Wednesdays. Recheck  in 2 weeks.

## 2010-04-10 ENCOUNTER — Encounter (INDEPENDENT_AMBULATORY_CARE_PROVIDER_SITE_OTHER): Payer: Medicare Other

## 2010-04-10 ENCOUNTER — Encounter: Payer: Self-pay | Admitting: Cardiovascular Disease

## 2010-04-10 DIAGNOSIS — Z7901 Long term (current) use of anticoagulants: Secondary | ICD-10-CM

## 2010-04-10 DIAGNOSIS — I4891 Unspecified atrial fibrillation: Secondary | ICD-10-CM

## 2010-04-10 LAB — CONVERTED CEMR LAB: POC INR: 2.7

## 2010-04-11 NOTE — Progress Notes (Signed)
Summary: Irregular heart rate  Phone Note Call from Patient   Caller: Patient Call For: Nurse Summary of Call: pt had irregular heart rate over the weekend. States it started saturday night through sunday night. This morning pt states it seems like everything feels normal. Wanted to know what she needed to do.  Initial call taken by: Lysbeth Galas CMA,  April 01, 2010 9:37 AM  Follow-up for Phone Call        Spoke to pt, she states "feels irregular HR" started Saturday and now she has had same sensations today. Pt denies any symptoms of dizziness, lightheadedness, SOB. Pt was on Prednisone for 6 days and just took last dose Sunday. Pt is taking Rhythmol 225mg  three times a day and Metoprolol Succinate 50mg  once daily. Spoke to Dr. Mariah Milling, he is aware. Pt has hx of paroxysmal a fib. Pt will need to write down her HR numbers for the next few days and let us know results. We may have to change metoprolol dose but will need to see her HR numbers before we do so. Also, instruct pt to call back with any change in symptoms in the meantime. Follow-up by: Lanny Hurst RN,  April 01, 2010 11:33 AM  Additional Follow-up for Phone Call Additional follow up Details #1::        pt notified will call back with HR numbers in a couple days Additional Follow-up by: Lysbeth Galas CMA,  April 01, 2010 2:52 PM

## 2010-04-17 NOTE — Medication Information (Signed)
Summary: ROV/MES  Anticoagulant Therapy  Managed by: Bethena Midget, RN, BSN Referring MD: Mariah Milling PCP: Duncan Dull Supervising MD: Mariah Milling Indication 1: Atrial Fibrillation Lab Used: [facility] [place] Site: [place] INR POC 2.7 INR RANGE 2.0-3.0  Dietary changes: no    Health status changes: no    Bleeding/hemorrhagic complications: no    Recent/future hospitalizations: no    Any changes in medication regimen? no    Recent/future dental: no  Any missed doses?: no       Is patient compliant with meds? yes       Allergies: No Known Drug Allergies  Anticoagulation Management History:      The patient is taking warfarin and comes in today for a routine follow up visit.  Positive risk factors for bleeding include an age of 51 years or older.  The bleeding index is 'intermediate risk'.  Positive CHADS2 values include History of HTN and Age > 17 years old.  Her last INR was 3.0.  Anticoagulation responsible provider: [facility].  INR POC: 2.7.  Cuvette Lot#: [id number].  Exp: 02/2011.    Anticoagulation Management Assessment/Plan:      The patient's current anticoagulation dose is Warfarin sodium 2 mg tabs: Use as directed by Anticoagualtion Clinic.  The target INR is 2.0-3.0.  The next INR is due 05/08/2010.  Anticoagulation instructions were given to patient.  Results were reviewed/authorized by Bethena Midget, RN, BSN.  She was notified by Bethena Midget, RN, BSN.         Prior Anticoagulation Instructions: INR 4.3  Hold today and tomorrow's dose. Then continue same dosage 1 tablet every day except 1 1/2 tablets on Wednesdays. Recheck in 2 weeks.  Current Anticoagulation Instructions: INR 2.7 Continue 2mg s everyday except 3mg s on Wednesdays. Recheck in 4 weeks.

## 2010-05-07 NOTE — Letter (Signed)
Summary: At Home Pulse Readings  At Home Pulse Readings   Imported By: Harlon Flor 05/01/2010 13:12:18  _____________________________________________________________________  External Attachment:    Type:   Image     Comment:   External Document  Appended Document: At Home Pulse Readings Pt's HR readings f/u from phone note on 04/01/10. /MES  Appended Document: At Home Pulse Readings heart rates are fantastic. OK to stop checking. Thx  Appended Document: At Home Pulse Readings notified patient of heart rate readings per Dr. Mariah Milling look okay.  No need to continue checking.

## 2010-05-08 ENCOUNTER — Encounter (INDEPENDENT_AMBULATORY_CARE_PROVIDER_SITE_OTHER): Payer: Medicare Other

## 2010-05-08 ENCOUNTER — Encounter: Payer: Self-pay | Admitting: Cardiovascular Disease

## 2010-05-08 DIAGNOSIS — I4891 Unspecified atrial fibrillation: Secondary | ICD-10-CM

## 2010-05-08 DIAGNOSIS — Z7901 Long term (current) use of anticoagulants: Secondary | ICD-10-CM

## 2010-05-08 LAB — CONVERTED CEMR LAB: POC INR: 2.5

## 2010-05-14 NOTE — Medication Information (Signed)
Summary: rov/tm  Anticoagulant Therapy  Managed by: Cloyde Reams, RN, BSN Referring MD: Mariah Milling PCP: Duncan Dull Supervising MD: Mariah Milling Indication 1: Atrial Fibrillation Lab Used: [facility] [place] Site: [place] INR POC 2.5 INR RANGE 2.0-3.0  Dietary changes: no    Health status changes: no    Bleeding/hemorrhagic complications: no    Recent/future hospitalizations: no    Any changes in medication regimen? yes       Details: Temorarily taking a gout medication x 2 weeks, unsure of name and potassium supplement.    Recent/future dental: no  Any missed doses?: no       Is patient compliant with meds? yes       Allergies: No Known Drug Allergies  Anticoagulation Management History:      The patient is taking warfarin and comes in today for a routine follow up visit.  Positive risk factors for bleeding include an age of 75 years or older.  The bleeding index is 'intermediate risk'.  Positive CHADS2 values include History of HTN and Age > 66 years old.  Her last INR was 3.0.  Anticoagulation responsible provider: [facility].  INR POC: 2.5.  Cuvette Lot#: [id number].  Exp: 02/2011.    Anticoagulation Management Assessment/Plan:      The patient's current anticoagulation dose is Warfarin sodium 2 mg tabs: Use as directed by Anticoagualtion Clinic.  The target INR is 2.0-3.0.  The next INR is due 06/05/2010.  Anticoagulation instructions were given to patient.  Results were reviewed/authorized by Cloyde Reams, RN, BSN.  She was notified by Cloyde Reams RN.         Prior Anticoagulation Instructions: INR 2.7 Continue 2mg s everyday except 3mg s on Wednesdays. Recheck in 4 weeks.   Current Anticoagulation Instructions: INR 2.5  Continue on same dosage 2mg  daily except 3mg  on Wednesdays.  Recheck in 4 weeks.

## 2010-05-21 ENCOUNTER — Encounter: Payer: Self-pay | Admitting: Cardiovascular Disease

## 2010-05-21 DIAGNOSIS — I4891 Unspecified atrial fibrillation: Secondary | ICD-10-CM

## 2010-05-26 ENCOUNTER — Encounter: Payer: Self-pay | Admitting: Cardiovascular Disease

## 2010-05-26 ENCOUNTER — Emergency Department: Payer: Self-pay | Admitting: Emergency Medicine

## 2010-06-05 ENCOUNTER — Ambulatory Visit (INDEPENDENT_AMBULATORY_CARE_PROVIDER_SITE_OTHER): Payer: Medicare Other | Admitting: Emergency Medicine

## 2010-06-05 DIAGNOSIS — I4891 Unspecified atrial fibrillation: Secondary | ICD-10-CM

## 2010-06-05 LAB — POCT INR: INR: 2.9

## 2010-06-06 ENCOUNTER — Ambulatory Visit (INDEPENDENT_AMBULATORY_CARE_PROVIDER_SITE_OTHER): Payer: Medicare Other | Admitting: Cardiovascular Disease

## 2010-06-06 ENCOUNTER — Encounter: Payer: Self-pay | Admitting: Cardiovascular Disease

## 2010-06-06 DIAGNOSIS — R079 Chest pain, unspecified: Secondary | ICD-10-CM

## 2010-06-06 DIAGNOSIS — R002 Palpitations: Secondary | ICD-10-CM

## 2010-06-06 DIAGNOSIS — R609 Edema, unspecified: Secondary | ICD-10-CM

## 2010-06-06 DIAGNOSIS — I4891 Unspecified atrial fibrillation: Secondary | ICD-10-CM

## 2010-06-06 DIAGNOSIS — I10 Essential (primary) hypertension: Secondary | ICD-10-CM

## 2010-06-06 DIAGNOSIS — E785 Hyperlipidemia, unspecified: Secondary | ICD-10-CM

## 2010-06-06 DIAGNOSIS — R0602 Shortness of breath: Secondary | ICD-10-CM

## 2010-06-06 DIAGNOSIS — I359 Nonrheumatic aortic valve disorder, unspecified: Secondary | ICD-10-CM

## 2010-06-06 MED ORDER — HYDRALAZINE HCL 25 MG PO TABS: 25.0000 mg | ORAL_TABLET | Freq: Three times a day (TID) | ORAL | Status: DC

## 2010-06-06 NOTE — Assessment & Plan Note (Signed)
Her edema appears to be worse on the higher dose amlodipine. We have suggested that she decrease the amlodipine to 5 mg as she was on previously. We will start hydralazine 25 mg b.i.d., possibly titrating to t.i.d. If needed for blood pressure control.

## 2010-06-06 NOTE — Assessment & Plan Note (Signed)
She does have chronic shortness of breath which I suspect is secondary to her moderate aortic valve stenosis. She is also very deconditioned.

## 2010-06-06 NOTE — Assessment & Plan Note (Signed)
Her last echocardiogram was June 2007. If she has worsening symptoms, we will repeat an echocardiogram to evaluate her aortic valve stenosis.

## 2010-06-06 NOTE — Assessment & Plan Note (Signed)
Her chest pain was somewhat nonspecific. She does have underlying moderate aortic valve disease estimated at 1 cm. It certainly could be related or something else. She typically does not have any chest pain at baseline and we have suggested that we watch her and her closely for additional episodes.

## 2010-06-06 NOTE — Progress Notes (Signed)
Patient ID: Madeline Perez, female    DOB: 18-Jul-1923, 77 y.o.   MRN: [id number]  HPI Comments: Madeline Perez is a 77 year old woman with history of paroxysmal atrial fibrillation, moderate aortic stenosis, chronic lower extremity edema, chronic renal insufficiency, Diastolic dysfunction, pulmonary hypertension who presents for routine followup.   Madeline Perez reports that she has had recent episodes of palpitations. They are relatively short-lived though she does report several periods where it seemed to wax and wane often on. She had mild discomfort and went to the emergency room at the recommendation of the on-call cardiology physician. In the ER, her EKG showed normal sinus rhythm with rate 74 beats per minute with no significant changes from previous EKGs. Blood work showed normal cardiac enzymes, BUN 28, creatinine 1.6.  She was discharged home and has felt relatively well since then. She did report a brief episode of pain around her xiphoid during this episode.  She does report increasing swelling in her legs. She believes it could be secondary to going back up on the amlodipine to 10 mg daily. She had previously tried 10 mg before and this caused worsening, uncomfortable bilateral leg swelling. She would like to decrease the dose but is scared about her blood pressure.  echocardiogram done in 07/2009 Moderate aortic valve stenosis with mildly elevated right ventricular systolic pressure consistent with mild pulmonary hypertension. Mean aortic valve gradient is 21 mmHg, peak gradient of 42 mmHg, estimated aortic valve area 1.0 cm, normal LV and RV size and function, mild to moderate aortic valve regurgitation, mildly dilated left atrium and right atrium. Evidence of diastolic relaxation abnormality.    aortic valve area was 1.2 cm in 2007.     EKG shows normal sinus rhythm with rate of 66 beats per minute, borderline left axis deviation, unable to rule out anteroseptal  infarct      Review of Systems  Constitutional: Negative.   HENT: Negative.   Eyes: Negative.   Respiratory: Negative.   Cardiovascular: Positive for chest pain, palpitations and leg swelling.  Gastrointestinal: Negative.   Musculoskeletal: Negative.   Skin: Negative.   Neurological: Negative.   Hematological: Negative.   Psychiatric/Behavioral: Negative.   All other systems reviewed and are negative.    BP 122/60  Pulse 66  Ht 5\' 5"  (1.651 m)  Wt 169 lb 12.8 oz (77.021 kg)  BMI 28.26 kg/m2   Physical Exam  Nursing note and vitals reviewed. Constitutional: She is oriented to person, place, and time. She appears well-developed and well-nourished.  HENT:  Head: Normocephalic.  Nose: Nose normal.  Mouth/Throat: Oropharynx is clear and moist.  Eyes: Conjunctivae are normal. Pupils are equal, round, and reactive to light.  Neck: Normal range of motion. Neck supple. No JVD present.  Cardiovascular: Normal rate, regular rhythm, S1 normal, S2 normal and intact distal pulses.  Exam reveals no gallop and no friction rub.   Murmur heard.  Crescendo systolic murmur is present with a grade of 3/6  Pulmonary/Chest: Effort normal and breath sounds normal. No respiratory distress. She has no wheezes. She has no rales. She exhibits no tenderness.  Abdominal: Soft. Bowel sounds are normal. She exhibits no distension. There is no tenderness.  Musculoskeletal: Normal range of motion. She exhibits edema. She exhibits no tenderness.       Edema is 1-2+, pitting to below the knees bilaterally  Lymphadenopathy:    She has no cervical adenopathy.  Neurological: She is alert and oriented to person, place, and time. Coordination normal.  Skin: Skin is warm and dry. No rash noted. No erythema.  Psychiatric: She has a normal mood and affect. Her behavior is normal. Judgment and thought content normal.         Assessment and Plan

## 2010-06-06 NOTE — Assessment & Plan Note (Signed)
She currently appears to be maintaining sinus rhythm though certainly could be having short episodes of paroxysmal atrial fibrillation. She is currently on warfarin.

## 2010-06-06 NOTE — Assessment & Plan Note (Signed)
As mentioned, we will decrease her amlodipine secondary to worsening swelling. She is found is quite uncomfortable. We will start her on hydralazine. I hesitate to start an ARB or ACE inhibitor given her underlying renal dysfunction though if felt appropriate by the nephrologist, we could try a ACE inhibitor

## 2010-06-06 NOTE — Patient Instructions (Signed)
DECREASE Amlodipine to 5mg , due to swelling. Please monitor BP in the meantime and call office if consistently elevated. START Hydralazine 25mg , take one in AM and one in PM. Your physician recommends that you schedule a follow-up appointment in: 6 months

## 2010-06-06 NOTE — Assessment & Plan Note (Signed)
I'm concerned that her periods of palpitations could be paroxysmal atrial fibrillation. They are short lived and she does not seem to be particularly symptomatic. She could be having simple ectopy as she does report that they seem to happen more at nighttime when she is quiet. We have suggested she try an additional half dose of metoprolol as needed if she has worsening palpitations. If it persists, I asked her to contact our office possibly for an EKG.

## 2010-07-03 ENCOUNTER — Ambulatory Visit (INDEPENDENT_AMBULATORY_CARE_PROVIDER_SITE_OTHER): Payer: Medicare Other | Admitting: Emergency Medicine

## 2010-07-03 DIAGNOSIS — I4891 Unspecified atrial fibrillation: Secondary | ICD-10-CM

## 2010-07-03 LAB — POCT INR: INR: 1.3

## 2010-07-10 ENCOUNTER — Ambulatory Visit (INDEPENDENT_AMBULATORY_CARE_PROVIDER_SITE_OTHER): Payer: Medicare Other | Admitting: Emergency Medicine

## 2010-07-10 DIAGNOSIS — I4891 Unspecified atrial fibrillation: Secondary | ICD-10-CM

## 2010-07-10 LAB — POCT INR: INR: 1.5

## 2010-07-24 ENCOUNTER — Ambulatory Visit (INDEPENDENT_AMBULATORY_CARE_PROVIDER_SITE_OTHER): Payer: Medicare Other | Admitting: Emergency Medicine

## 2010-07-24 DIAGNOSIS — I4891 Unspecified atrial fibrillation: Secondary | ICD-10-CM

## 2010-07-24 LAB — POCT INR: INR: 2.1

## 2010-07-30 ENCOUNTER — Ambulatory Visit: Payer: Self-pay | Admitting: Ophthalmology

## 2010-08-07 ENCOUNTER — Ambulatory Visit: Payer: Self-pay | Admitting: Ophthalmology

## 2010-08-08 ENCOUNTER — Encounter: Payer: Self-pay | Admitting: Cardiovascular Disease

## 2010-08-08 ENCOUNTER — Other Ambulatory Visit: Payer: Self-pay | Admitting: Cardiovascular Disease

## 2010-08-14 ENCOUNTER — Encounter: Payer: Medicare Other | Admitting: Emergency Medicine

## 2010-08-21 ENCOUNTER — Ambulatory Visit (INDEPENDENT_AMBULATORY_CARE_PROVIDER_SITE_OTHER): Payer: Medicare Other | Admitting: Emergency Medicine

## 2010-08-21 DIAGNOSIS — I4891 Unspecified atrial fibrillation: Secondary | ICD-10-CM

## 2010-08-21 LAB — POCT INR: INR: 3.4

## 2010-09-11 ENCOUNTER — Ambulatory Visit (INDEPENDENT_AMBULATORY_CARE_PROVIDER_SITE_OTHER): Payer: Medicare Other | Admitting: Emergency Medicine

## 2010-09-11 DIAGNOSIS — I4891 Unspecified atrial fibrillation: Secondary | ICD-10-CM

## 2010-10-09 ENCOUNTER — Ambulatory Visit (INDEPENDENT_AMBULATORY_CARE_PROVIDER_SITE_OTHER): Payer: Medicare Other | Admitting: Emergency Medicine

## 2010-10-09 ENCOUNTER — Telehealth: Payer: Self-pay

## 2010-10-09 DIAGNOSIS — I4891 Unspecified atrial fibrillation: Secondary | ICD-10-CM

## 2010-10-09 MED ORDER — WARFARIN SODIUM 2 MG PO TABS: 2.0000 mg | ORAL_TABLET | ORAL | Status: DC

## 2010-10-09 NOTE — Telephone Encounter (Signed)
Requested a refill for warfarin 2 mg take as directed.

## 2010-10-18 ENCOUNTER — Ambulatory Visit: Payer: Medicare Other | Admitting: Internal Medicine

## 2010-10-25 ENCOUNTER — Other Ambulatory Visit: Payer: Self-pay | Admitting: Internal Medicine

## 2010-10-25 MED ORDER — ALLOPURINOL 100 MG PO TABS: 100.0000 mg | ORAL_TABLET | Freq: Every day | ORAL | Status: DC

## 2010-11-06 ENCOUNTER — Encounter: Payer: Self-pay | Admitting: Internal Medicine

## 2010-11-06 ENCOUNTER — Ambulatory Visit (INDEPENDENT_AMBULATORY_CARE_PROVIDER_SITE_OTHER): Payer: Medicare Other | Admitting: Emergency Medicine

## 2010-11-06 DIAGNOSIS — I4891 Unspecified atrial fibrillation: Secondary | ICD-10-CM

## 2010-11-06 LAB — POCT INR: INR: 2.3

## 2010-11-28 ENCOUNTER — Ambulatory Visit (INDEPENDENT_AMBULATORY_CARE_PROVIDER_SITE_OTHER): Payer: Medicare Other | Admitting: Internal Medicine

## 2010-11-28 ENCOUNTER — Encounter: Payer: Self-pay | Admitting: Internal Medicine

## 2010-11-28 VITALS — BP 126/74 | HR 59 | Temp 98.1°F | Resp 14 | Ht 64.0 in | Wt 161.8 lb

## 2010-11-28 DIAGNOSIS — I359 Nonrheumatic aortic valve disorder, unspecified: Secondary | ICD-10-CM

## 2010-11-28 DIAGNOSIS — E785 Hyperlipidemia, unspecified: Secondary | ICD-10-CM

## 2010-11-28 DIAGNOSIS — Z7901 Long term (current) use of anticoagulants: Secondary | ICD-10-CM

## 2010-11-28 DIAGNOSIS — L299 Pruritus, unspecified: Secondary | ICD-10-CM

## 2010-11-28 DIAGNOSIS — I4891 Unspecified atrial fibrillation: Secondary | ICD-10-CM

## 2010-11-28 DIAGNOSIS — Z23 Encounter for immunization: Secondary | ICD-10-CM

## 2010-11-28 DIAGNOSIS — R002 Palpitations: Secondary | ICD-10-CM

## 2010-11-28 DIAGNOSIS — R609 Edema, unspecified: Secondary | ICD-10-CM

## 2010-11-28 DIAGNOSIS — E038 Other specified hypothyroidism: Secondary | ICD-10-CM

## 2010-11-28 DIAGNOSIS — G459 Transient cerebral ischemic attack, unspecified: Secondary | ICD-10-CM

## 2010-11-28 DIAGNOSIS — I10 Essential (primary) hypertension: Secondary | ICD-10-CM

## 2010-11-28 DIAGNOSIS — L853 Xerosis cutis: Secondary | ICD-10-CM | POA: Insufficient documentation

## 2010-11-28 LAB — COMPREHENSIVE METABOLIC PANEL
ALT: 12 U/L (ref 0–35)
Albumin: 3.9 g/dL (ref 3.5–5.2)
CO2: 31 mEq/L (ref 19–32)
Chloride: 103 mEq/L (ref 96–112)
GFR: 37.08 mL/min — ABNORMAL LOW (ref >60.00)
Potassium: 3.9 mEq/L (ref 3.5–5.1)
Sodium: 143 mEq/L (ref 135–145)
Total Bilirubin: 0.7 mg/dL (ref 0.3–1.2)
Total Protein: 7.4 g/dL (ref 6.0–8.3)

## 2010-11-28 MED ORDER — ZOSTER VACCINE LIVE 19400 UNT/0.65ML ~~LOC~~ SOLR: 0.6500 mL | Freq: Once | SUBCUTANEOUS | Status: DC

## 2010-11-28 NOTE — Assessment & Plan Note (Signed)
Will check thyroid and BUN, but i suspect due to lack of moisturizing products.

## 2010-11-28 NOTE — Assessment & Plan Note (Signed)
Currently well controlled  No changes to regimen.

## 2010-11-28 NOTE — Assessment & Plan Note (Signed)
Chronic, secondary to ckd, venous insufficiency, and diastolic dysfunction.  Stable currently.

## 2010-11-28 NOTE — Patient Instructions (Addendum)
Your skin is very dry and this may be causing you to itch so much.  Recommend switching to a moisturizing shower soap (Caress) AND using a mositurizing cream after your shower on the most troublesome areas.  Eucerin and Curel are excellent  Choices.    Your blood pressure is excellent today.  No changes today.

## 2010-11-28 NOTE — Assessment & Plan Note (Signed)
Previously noted by Dr. Mariah Milling.  Will defer management to him since her symptoms are mild and not accompanied by dizziness or shortness of breath.

## 2010-11-28 NOTE — Progress Notes (Signed)
Subjective:    Patient ID: Madeline Perez, female    DOB: 10-28-1923, 77 y.o.   MRN: [id number]  HPI  55 you white female with history of atrial fibrillation, chronic kidney disease stage 3, sedentary lifestyle with deconditioning, presents for followup on chronic medical issues.  She reports a minor fall which occurred 6 weeks ago in her home which caused left lateral rib pain which has been improving but not resolved.  She was not evaluated at time of fall, despite history of chronic anticoagulation.  Also reporting pain in her left left lateral neck  which is nonradiating and aggravated by turing head.  She also repots that her heart feels like it flutters when she lies in a flat supine position, within 4 -5 minutes of lying down so she has learned to sleep on an incline.  Denies orthopnea and  dizziness.  so occurs occasionally during the day when not lying down . Has mentioned this to cardiology in the past and may have had Holter monitor for prior similar complaint.  She also reports chronic dry skin and pruritus. Past Medical History  Diagnosis Date  . Atrial fibrillation   . Hypertension   . Hyperlipidemia   . Aortic stenosis     mild to moderate with estimated valve area of 1.2 cm in 2007  . Macular degeneration   . Chronic renal insufficiency       Current Outpatient Prescriptions on File Prior to Visit  Medication Sig Dispense Refill  . allopurinol (ZYLOPRIM) 100 MG tablet Take 1 tablet (100 mg total) by mouth daily.  30 tablet  3  . amLODipine (NORVASC) 5 MG tablet Take 5 mg by mouth daily.        . beta carotene w/minerals (OCUVITE) tablet Take 1 tablet by mouth daily.        . cholecalciferol (VITAMIN D) 1000 UNITS tablet Take 1,000 Units by mouth daily.        . furosemide (LASIX) 40 MG tablet Take 40 mg by mouth daily.        . hydrALAZINE (APRESOLINE) 25 MG tablet Take 1 tablet (25 mg total) by mouth 3 (three) times daily.  90 tablet  6  . metoprolol (TOPROL-XL) 50 MG 24 hr  tablet Take 50 mg by mouth daily.        . pramipexole (MIRAPEX) 0.5 MG tablet Take 0.5 mg by mouth at bedtime.       . propafenone (RYTHMOL) 225 MG tablet TAKE 1 TABLET 3 TIMES A DAY  270 tablet  3  . rosuvastatin (CRESTOR) 10 MG tablet Take 10 mg by mouth daily.        Marland Kitchen warfarin (COUMADIN) 2 MG tablet Take 1 tablet (2 mg total) by mouth as directed.  90 tablet  2    Review of Systems  Constitutional: Negative for fever, chills and unexpected weight change.  HENT: Positive for neck pain and neck stiffness. Negative for hearing loss, ear pain, nosebleeds, congestion, sore throat, facial swelling, rhinorrhea, sneezing, mouth sores, trouble swallowing, voice change, postnasal drip, sinus pressure, tinnitus and ear discharge.   Eyes: Negative for pain, discharge, redness and visual disturbance.  Respiratory: Negative for cough, chest tightness, shortness of breath, wheezing and stridor.   Cardiovascular: Positive for chest pain. Negative for palpitations and leg swelling.  Musculoskeletal: Negative for myalgias and arthralgias.  Skin: Negative for color change and rash.  Neurological: Negative for dizziness, weakness, light-headedness and headaches.  Hematological: Negative for adenopathy.  BP 126/74  Pulse 59  Temp(Src) 98.1 F (36.7 C) (Oral)  Resp 14  Ht 5\' 4"  (1.626 m)  Wt 161 lb 12 oz (73.369 kg)  BMI 27.76 kg/m2  SpO2 96%     Objective:   Physical Exam  Constitutional: She is oriented to person, place, and time. She appears well-developed and well-nourished.  HENT:  Mouth/Throat: Oropharynx is clear and moist.  Eyes: EOM are normal. Pupils are equal, round, and reactive to light. No scleral icterus.  Neck: Normal range of motion. Neck supple. No JVD present. No thyromegaly present.  Cardiovascular: Normal rate, regular rhythm, normal heart sounds and intact distal pulses.   Pulmonary/Chest: Effort normal and breath sounds normal.  Abdominal: Soft. Bowel sounds are normal.  She exhibits no mass. There is no tenderness.  Musculoskeletal: Normal range of motion. She exhibits no edema.  Lymphadenopathy:    She has no cervical adenopathy.  Neurological: She is alert and oriented to person, place, and time.  Skin: Skin is warm and dry.     Psychiatric: She has a normal mood and affect.          Assessment & Plan:

## 2010-12-04 ENCOUNTER — Ambulatory Visit (INDEPENDENT_AMBULATORY_CARE_PROVIDER_SITE_OTHER): Payer: Medicare Other | Admitting: Emergency Medicine

## 2010-12-04 DIAGNOSIS — I4891 Unspecified atrial fibrillation: Secondary | ICD-10-CM

## 2010-12-04 LAB — POCT INR: INR: 3.4

## 2010-12-05 ENCOUNTER — Encounter: Payer: Self-pay | Admitting: Cardiovascular Disease

## 2010-12-06 ENCOUNTER — Ambulatory Visit (INDEPENDENT_AMBULATORY_CARE_PROVIDER_SITE_OTHER): Payer: Medicare Other | Admitting: Cardiovascular Disease

## 2010-12-06 ENCOUNTER — Encounter: Payer: Self-pay | Admitting: Cardiovascular Disease

## 2010-12-06 VITALS — BP 131/71 | HR 67 | Ht 64.0 in | Wt 164.0 lb

## 2010-12-06 DIAGNOSIS — I359 Nonrheumatic aortic valve disorder, unspecified: Secondary | ICD-10-CM

## 2010-12-06 DIAGNOSIS — R609 Edema, unspecified: Secondary | ICD-10-CM

## 2010-12-06 DIAGNOSIS — I10 Essential (primary) hypertension: Secondary | ICD-10-CM

## 2010-12-06 DIAGNOSIS — N289 Disorder of kidney and ureter, unspecified: Secondary | ICD-10-CM | POA: Insufficient documentation

## 2010-12-06 DIAGNOSIS — E785 Hyperlipidemia, unspecified: Secondary | ICD-10-CM

## 2010-12-06 DIAGNOSIS — I4891 Unspecified atrial fibrillation: Secondary | ICD-10-CM

## 2010-12-06 NOTE — Progress Notes (Signed)
Patient ID: Madeline Perez, female    DOB: [birth year], 77 y.o.   MRN: [id number]  HPI Comments: Madeline Perez is a 77 year old woman with history of paroxysmal atrial fibrillation, moderate aortic stenosis, chronic lower extremity edema, chronic renal insufficiency, Diastolic dysfunction, pulmonary hypertension who presents for routine followup.   She reports that she continues to have occasional episodes of palpitations at nighttime. They are not particularly bothersome. She has not had to take extra metoprolol for her symptoms. She does have mild swelling of her left lower extremity more than the right leg. She does scratch and has dry skin.   Recent lab work shows creatinine 1.4. Her Lasix had been decreased from 80 mg to 40 mg daily. She does have significant problems with incontinence and would like to decrease the diuretic more.  echocardiogram done in 07/2009 Moderate aortic valve stenosis with mildly elevated right ventricular systolic pressure consistent with mild pulmonary hypertension. Mean aortic valve gradient is 21 mmHg, peak gradient of 42 mmHg, estimated aortic valve area 1.0 cm, normal LV and RV size and function, mild to moderate aortic valve regurgitation, mildly dilated left atrium and right atrium. Evidence of diastolic relaxation abnormality.   aortic valve area was 1.2 cm in 2007.     EKG shows normal sinus rhythm with rate of 66 beats per minute,  left axis deviation, unable to rule out anteroseptal infarct, LVH    Outpatient Encounter Prescriptions as of 12/06/2010  Medication Sig Dispense Refill  . allopurinol (ZYLOPRIM) 100 MG tablet Take 1 tablet (100 mg total) by mouth daily.  30 tablet  3  . amLODipine (NORVASC) 5 MG tablet Take 5 mg by mouth daily.        . beta carotene w/minerals (OCUVITE) tablet Take 1 tablet by mouth daily.        . cholecalciferol (VITAMIN D) 1000 UNITS tablet Take 1,000 Units by mouth daily.        . furosemide (LASIX) 40 MG tablet Take  40 mg by mouth daily.        . hydrALAZINE (APRESOLINE) 25 MG tablet Take 1 tablet (25 mg total) by mouth 3 (three) times daily.  90 tablet  6  . metoprolol (TOPROL-XL) 50 MG 24 hr tablet Take 50 mg by mouth daily.        . pramipexole (MIRAPEX) 0.5 MG tablet Take 0.5 mg by mouth at bedtime.       . propafenone (RYTHMOL) 225 MG tablet TAKE 1 TABLET 3 TIMES A DAY  270 tablet  3  . rosuvastatin (CRESTOR) 10 MG tablet Take 10 mg by mouth daily.        Marland Kitchen warfarin (COUMADIN) 2 MG tablet Take 1 tablet (2 mg total) by mouth as directed.  90 tablet  2  . zoster vaccine live, PF, (ZOSTAVAX) 82956 UNT/0.65ML injection Inject 19,400 Units into the skin once.  1 vial  0     Review of Systems  Constitutional: Negative.   HENT: Negative.   Eyes: Negative.   Respiratory: Negative.   Cardiovascular: Positive for palpitations and leg swelling.  Gastrointestinal: Negative.   Musculoskeletal: Negative.   Skin: Negative.   Neurological: Negative.   Hematological: Negative.   Psychiatric/Behavioral: Negative.   All other systems reviewed and are negative.    BP 131/71  Pulse 67  Ht 5\' 4"  (1.626 m)  Wt 164 lb (74.39 kg)  BMI 28.15 kg/m2   Physical Exam  Nursing note and vitals reviewed. Constitutional: She is oriented  to person, place, and time. She appears well-developed and well-nourished.  HENT:  Head: Normocephalic.  Nose: Nose normal.  Mouth/Throat: Oropharynx is clear and moist.  Eyes: Conjunctivae are normal. Pupils are equal, round, and reactive to light.  Neck: Normal range of motion. Neck supple. No JVD present.  Cardiovascular: Normal rate, regular rhythm, S1 normal, S2 normal and intact distal pulses.  Exam reveals no gallop and no friction rub.   Murmur heard.  Crescendo systolic murmur is present with a grade of 3/6  Pulmonary/Chest: Effort normal and breath sounds normal. No respiratory distress. She has no wheezes. She has no rales. She exhibits no tenderness.  Abdominal:  Soft. Bowel sounds are normal. She exhibits no distension. There is no tenderness.  Musculoskeletal: Normal range of motion. She exhibits edema. She exhibits no tenderness.       Edema is 1-2+, pitting to below the knees bilaterally  Lymphadenopathy:    She has no cervical adenopathy.  Neurological: She is alert and oriented to person, place, and time. Coordination normal.  Skin: Skin is warm and dry. No rash noted. No erythema.  Psychiatric: She has a normal mood and affect. Her behavior is normal. Judgment and thought content normal.         Assessment and Plan

## 2010-12-06 NOTE — Patient Instructions (Signed)
You are doing well. Try lasix every other day. If the leg swelling gets worse, go back to lasix daily. Please call us if you have new issues that need to be addressed before your next appt.  We will call you for a follow up Appt. In 6 months

## 2010-12-06 NOTE — Assessment & Plan Note (Signed)
Blood pressure is well controlled on today's visit. No changes made to the medications. 

## 2010-12-06 NOTE — Assessment & Plan Note (Signed)
Elevated creatinine is likely in part secondary to mild overdiuresis. Edema is likely secondary to venous insufficiency and possible contribution from the amlodipine. She does have significant problems with incontinence and I suggested she try Lasix every other day. If the edema gets worse, I would recommend holding the amlodipine and changing to an alternate blood pressure medication, possibly low-dose ACE inhibitor.

## 2010-12-06 NOTE — Assessment & Plan Note (Signed)
We have suggested she stay on her Crestor 

## 2010-12-06 NOTE — Assessment & Plan Note (Signed)
Edema is from venous insufficiency. No significant improvement with Lasix. One option would be to hold her amlodipine and use an alternate blood pressure medication. We did not make this change today as her edema is mild. She has not been able to tolerate higher dose amlodipine secondary to swelling. She does not wear TED hose.

## 2010-12-06 NOTE — Assessment & Plan Note (Signed)
Moderate aortic valve stenosis. Will schedule an echocardiogram for next year.

## 2010-12-06 NOTE — Assessment & Plan Note (Signed)
Rhythm is well controlled on Rythmol. No episodes of atrial fibrillation. The palpitations at nighttime are likely secondary to ectopy. We have suggested if they are bothersome, we could use metoprolol tartrate 25 mg p.r.n.

## 2010-12-25 ENCOUNTER — Ambulatory Visit (INDEPENDENT_AMBULATORY_CARE_PROVIDER_SITE_OTHER): Payer: Medicare Other | Admitting: Emergency Medicine

## 2010-12-25 DIAGNOSIS — I4891 Unspecified atrial fibrillation: Secondary | ICD-10-CM

## 2011-01-09 ENCOUNTER — Other Ambulatory Visit: Payer: Self-pay | Admitting: Internal Medicine

## 2011-01-09 ENCOUNTER — Ambulatory Visit (INDEPENDENT_AMBULATORY_CARE_PROVIDER_SITE_OTHER): Payer: Medicare Other | Admitting: Internal Medicine

## 2011-01-09 ENCOUNTER — Encounter: Payer: Self-pay | Admitting: Internal Medicine

## 2011-01-09 DIAGNOSIS — L0390 Cellulitis, unspecified: Secondary | ICD-10-CM

## 2011-01-09 DIAGNOSIS — L02419 Cutaneous abscess of limb, unspecified: Secondary | ICD-10-CM

## 2011-01-09 DIAGNOSIS — Z7901 Long term (current) use of anticoagulants: Secondary | ICD-10-CM

## 2011-01-09 DIAGNOSIS — E785 Hyperlipidemia, unspecified: Secondary | ICD-10-CM

## 2011-01-09 MED ORDER — ROSUVASTATIN CALCIUM 10 MG PO TABS: 10.0000 mg | ORAL_TABLET | Freq: Every day | ORAL | Status: DC

## 2011-01-09 MED ORDER — GENTAMICIN SULFATE 0.1 % EX OINT: TOPICAL_OINTMENT | Freq: Three times a day (TID) | CUTANEOUS | Status: DC

## 2011-01-09 MED ORDER — SULFAMETHOXAZOLE-TMP DS 800-160 MG PO TABS: 1.0000 | ORAL_TABLET | Freq: Two times a day (BID) | ORAL | Status: AC

## 2011-01-09 NOTE — Progress Notes (Signed)
Subjective:    Patient ID: Madeline Perez, female    DOB: [birth year], 77 y.o.   MRN: [id number]  HPI 77 year old female presents for an acute visit complaining of a wound on her left lower extremity which has been present for a couple of days. She notes that she often has areas on her legs and arms which are itchy to her and she scratches these areas. A couple of days ago, she noticed a break in her skin on her posterior left lower leg. This area became red and started oozing yellow fluid. She has been applying bandages. She has not been applying any topical antibiotic cream. She denies any fever or chills. She otherwise reports full compliance with her medications.  Outpatient Encounter Prescriptions as of 01/09/2011  Medication Sig Dispense Refill  . allopurinol (ZYLOPRIM) 100 MG tablet Take 1 tablet (100 mg total) by mouth daily.  30 tablet  3  . amLODipine (NORVASC) 5 MG tablet Take 5 mg by mouth daily.        . beta carotene w/minerals (OCUVITE) tablet Take 1 tablet by mouth daily.        . cholecalciferol (VITAMIN D) 1000 UNITS tablet Take 1,000 Units by mouth daily.        . furosemide (LASIX) 40 MG tablet Take 40 mg by mouth every other day.       . hydrALAZINE (APRESOLINE) 25 MG tablet Take 1 tablet (25 mg total) by mouth 3 (three) times daily.  90 tablet  6  . metoprolol (TOPROL-XL) 50 MG 24 hr tablet Take 50 mg by mouth daily.        . pramipexole (MIRAPEX) 0.5 MG tablet Take 0.5 mg by mouth at bedtime.       . propafenone (RYTHMOL) 225 MG tablet TAKE 1 TABLET 3 TIMES A DAY  270 tablet  3  . rosuvastatin (CRESTOR) 10 MG tablet Take 1 tablet (10 mg total) by mouth daily.  90 tablet  3  . warfarin (COUMADIN) 2 MG tablet Take 1 tablet (2 mg total) by mouth as directed.  90 tablet  2    Review of Systems  Constitutional: Negative for fever, chills and fatigue.  Cardiovascular: Positive for leg swelling.  Skin: Positive for color change, rash and wound.   BP 150/70  Pulse 64   Temp(Src) 98.3 F (36.8 C) (Oral)  Wt 163 lb (73.936 kg)  SpO2 97%     Objective:   Physical Exam  Constitutional: She appears well-developed and well-nourished. No distress.  Eyes: EOM are normal.  Neck: Normal range of motion. Neck supple.  Pulmonary/Chest: Effort normal.  Musculoskeletal: She exhibits edema (to mid lower leg).  Skin: She is not diaphoretic.             Assessment & Plan:  1. Cellulitis - patient with cellulitis on her posterior left lower leg and central open wound. This in the setting of chronic venous hypertension with bilateral lower extremity edema. Culture was sent from wound today. Will treat patient with oral Bactrim twice daily for one week. Will also have her apply topical gentamicin ointment. She will use nonadherent bandages to help with drainage. We will see her back in 4 days. If there is no improvement, she will likely need to have Unna boot placed and be monitored at the wound healing Center.  2. Chronic anticoagulation - we discussed that antibiotics can affect Coumadin level. Patient will have an INR checked when she returns to clinic on Monday.

## 2011-01-09 NOTE — Patient Instructions (Signed)
Call if redness increases, or if fever >101F develops. Follow up Monday. Lab test on Monday.

## 2011-01-13 ENCOUNTER — Ambulatory Visit (INDEPENDENT_AMBULATORY_CARE_PROVIDER_SITE_OTHER): Payer: Medicare Other | Admitting: Internal Medicine

## 2011-01-13 ENCOUNTER — Encounter: Payer: Self-pay | Admitting: Internal Medicine

## 2011-01-13 VITALS — BP 138/64 | HR 68 | Temp 98.2°F | Resp 14 | Wt 163.5 lb

## 2011-01-13 DIAGNOSIS — R609 Edema, unspecified: Secondary | ICD-10-CM

## 2011-01-13 DIAGNOSIS — L97909 Non-pressure chronic ulcer of unspecified part of unspecified lower leg with unspecified severity: Secondary | ICD-10-CM

## 2011-01-13 DIAGNOSIS — Z7901 Long term (current) use of anticoagulants: Secondary | ICD-10-CM

## 2011-01-13 DIAGNOSIS — I87339 Chronic venous hypertension (idiopathic) with ulcer and inflammation of unspecified lower extremity: Secondary | ICD-10-CM

## 2011-01-13 MED ORDER — FUROSEMIDE 20 MG PO TABS: 20.0000 mg | ORAL_TABLET | ORAL | Status: DC

## 2011-01-13 NOTE — Progress Notes (Signed)
Subjective:    Patient ID: Madeline Perez, female    DOB: 07/12/1923, 77 y.o.   MRN: [id number]  HPI 77 YO female presents to follow up left lower extremity wound. Pt started Bactrim. Reports decrease in drainage from wound. Decreased erythema. Wound culture still pending.  Pt has been wearing compression stockings. Denies fever, chills, or any recognized side effects from antibiotics.    Second concern today is worsening urinary incontinence with use of Lasix. Pt has decreased lasix to 40mg  qod to help with symptoms. Reports that symptoms markedly improved on days she does not take lasix. Denies dysuria, flank pain, fever, chills, or other symptoms.  Outpatient Encounter Prescriptions as of 01/13/2011  Medication Sig Dispense Refill  . allopurinol (ZYLOPRIM) 100 MG tablet Take 1 tablet (100 mg total) by mouth daily.  30 tablet  3  . amLODipine (NORVASC) 5 MG tablet Take 5 mg by mouth daily.        . beta carotene w/minerals (OCUVITE) tablet Take 1 tablet by mouth daily.        . cholecalciferol (VITAMIN D) 1000 UNITS tablet Take 1,000 Units by mouth daily.        . furosemide (LASIX) 20 MG tablet Take 1 tablet (20 mg total) by mouth every other day.  30 tablet  6  . gentamicin ointment (GARAMYCIN) 0.1 % Apply topically 3 (three) times daily.  15 g  0  . hydrALAZINE (APRESOLINE) 25 MG tablet Take 1 tablet (25 mg total) by mouth 3 (three) times daily.  90 tablet  6  . metoprolol (TOPROL-XL) 50 MG 24 hr tablet Take 50 mg by mouth daily.        . pramipexole (MIRAPEX) 0.5 MG tablet Take 0.5 mg by mouth at bedtime.       . propafenone (RYTHMOL) 225 MG tablet TAKE 1 TABLET 3 TIMES A DAY  270 tablet  3  . rosuvastatin (CRESTOR) 10 MG tablet Take 1 tablet (10 mg total) by mouth daily.  90 tablet  3  . sulfamethoxazole-trimethoprim (BACTRIM DS) 800-160 MG per tablet Take 1 tablet by mouth 2 (two) times daily.  14 tablet  0  . warfarin (COUMADIN) 2 MG tablet Take 1 tablet (2 mg total) by mouth as  directed.  90 tablet  2    Review of Systems  Constitutional: Negative for fever, chills, diaphoresis and fatigue.  Respiratory: Negative for cough and shortness of breath.   Cardiovascular: Positive for leg swelling.  Genitourinary: Positive for urgency and frequency.  Skin: Positive for color change and wound.   BP 138/64  Pulse 68  Temp(Src) 98.2 F (36.8 C) (Oral)  Resp 14  Wt 163 lb 8 oz (74.163 kg)  SpO2 94%     Objective:   Physical Exam  Constitutional: She appears well-developed and well-nourished. No distress.  HENT:  Head: Normocephalic and atraumatic.  Pulmonary/Chest: Effort normal.  Musculoskeletal: She exhibits edema (to bilateral mid shins).  Skin: She is not diaphoretic. There is erythema.             Assessment & Plan:  1. Left lower leg wound - Secondary to chronic venous hypertension and skin tear with surrounding cellulitis. Improved erythema with use of bactrim. Wound culture pending. Wound is now closed. Will continue compression stockings. Continue to apply non-adherent dressing. Follow up if any worsening of drainage or other concerns. Follow up 1 month.  2. Urinary incontinence - Made significantly worse with use of Furosemide. We discussed that this is  likely unavoidable with use of loop diuretic. Will try decreasing dose to 20mg  daily to see if any improvement. She will monitor weight and LEE.  Follow up with Dr. Darrick Huntsman in 1 month.  3. Chronic anticoagulation - Will recheck INR today as pt has been on Bactrim.

## 2011-01-14 ENCOUNTER — Telehealth: Payer: Self-pay | Admitting: *Deleted

## 2011-01-14 NOTE — Telephone Encounter (Signed)
Message copied by Regis Bill on Tue Jan 14, 2011  9:57 AM ------      Message from: Ronna Polio A      Created: Tue Jan 14, 2011  7:59 AM       INR was perfect. I would still like to repeat in 1 week, as she has been on antibiotics.

## 2011-01-14 NOTE — Telephone Encounter (Signed)
Patient informed. Has INR checked w/cardiology; has appointment 11.28.12

## 2011-01-15 LAB — WOUND CULTURE

## 2011-01-22 ENCOUNTER — Ambulatory Visit (INDEPENDENT_AMBULATORY_CARE_PROVIDER_SITE_OTHER): Payer: Medicare Other | Admitting: Emergency Medicine

## 2011-01-22 DIAGNOSIS — I4891 Unspecified atrial fibrillation: Secondary | ICD-10-CM

## 2011-01-22 LAB — POCT INR: INR: 2.5

## 2011-02-14 ENCOUNTER — Encounter: Payer: Self-pay | Admitting: Internal Medicine

## 2011-02-14 ENCOUNTER — Other Ambulatory Visit: Payer: Self-pay | Admitting: Internal Medicine

## 2011-02-14 ENCOUNTER — Ambulatory Visit (INDEPENDENT_AMBULATORY_CARE_PROVIDER_SITE_OTHER): Payer: Medicare Other | Admitting: Internal Medicine

## 2011-02-14 VITALS — BP 124/64 | HR 64 | Temp 98.3°F | Ht 64.0 in | Wt 161.1 lb

## 2011-02-14 DIAGNOSIS — G2581 Restless legs syndrome: Secondary | ICD-10-CM

## 2011-02-14 DIAGNOSIS — R42 Dizziness and giddiness: Secondary | ICD-10-CM

## 2011-02-14 DIAGNOSIS — M79675 Pain in left toe(s): Secondary | ICD-10-CM

## 2011-02-14 DIAGNOSIS — M79609 Pain in unspecified limb: Secondary | ICD-10-CM

## 2011-02-14 DIAGNOSIS — L738 Other specified follicular disorders: Secondary | ICD-10-CM

## 2011-02-14 DIAGNOSIS — Z79899 Other long term (current) drug therapy: Secondary | ICD-10-CM

## 2011-02-14 DIAGNOSIS — L853 Xerosis cutis: Secondary | ICD-10-CM

## 2011-02-14 DIAGNOSIS — M79674 Pain in right toe(s): Secondary | ICD-10-CM

## 2011-02-14 DIAGNOSIS — N289 Disorder of kidney and ureter, unspecified: Secondary | ICD-10-CM

## 2011-02-14 LAB — BASIC METABOLIC PANEL
Calcium: 9 mg/dL (ref 8.4–10.5)
Creatinine, Ser: 1.3 mg/dL — ABNORMAL HIGH (ref 0.4–1.2)
GFR: 42.89 mL/min — ABNORMAL LOW (ref >60.00)
Glucose, Bld: 85 mg/dL (ref 70–99)
Sodium: 141 mEq/L (ref 135–145)

## 2011-02-14 MED ORDER — PRAMIPEXOLE DIHYDROCHLORIDE 0.25 MG PO TABS: 0.2500 mg | ORAL_TABLET | Freq: Every day | ORAL | Status: DC

## 2011-02-14 MED ORDER — PRAMIPEXOLE DIHYDROCHLORIDE 0.5 MG PO TABS: 0.5000 mg | ORAL_TABLET | Freq: Every day | ORAL | Status: DC

## 2011-02-14 MED ORDER — ALLOPURINOL 100 MG PO TABS: 100.0000 mg | ORAL_TABLET | Freq: Every day | ORAL | Status: DC

## 2011-02-14 MED ORDER — HYDROXYZINE HCL 50 MG PO TABS: 25.0000 mg | ORAL_TABLET | Freq: Three times a day (TID) | ORAL | Status: AC | PRN

## 2011-02-14 NOTE — Progress Notes (Signed)
Subjective:    Patient ID: Madeline Perez, female    DOB: 1923-09-28, 77 y.o.   MRN: [id number]  HPI  [name] is an 77 yo white female  with a history ofaortic stenosis, pulmonary  hypertension, diastolic dysfunction and venous insufficiency who was recently treated by Dr. Dan Humphreys for venous ulcer with early cellulitis with antibiotics and compession stockings. Her ulcer has resolved, as has most of her redness, but she continues to have a dry red patch that is pruritic on the left posterior achilles area.  She finsihed her antibiotics and  Is wearing her compression stockings and using Vaseline IntensiveCare moisturizer.  Her 2nd issue is painful left great toe aggravated by closed toe shoes for one month. She attributes the pain to her gout, but denies redness and warmth and has no symptoms at night  Her 3rd issues is an episode of loss of balance that reports happened while walking home one evening from a dinner.  She states she was unable to control her arms and torso and grabbed on to her husband for stability but dragged him down as well during her fall. She denied any alcohol consumption or prior respiratory infection.  Caused her to fall.  She has no prior incidents,  Symptoms of dizziness lasted only a few seconds.  .   Past Medical History  Diagnosis Date  . Atrial fibrillation   . Hypertension   . Hyperlipidemia   . Aortic stenosis     mild to moderate with estimated valve area of 1.2 cm in 2007  . Macular degeneration   . Chronic renal insufficiency    Current Outpatient Prescriptions on File Prior to Visit  Medication Sig Dispense Refill  . amLODipine (NORVASC) 5 MG tablet Take 5 mg by mouth daily.        . beta carotene w/minerals (OCUVITE) tablet Take 1 tablet by mouth daily.        . cholecalciferol (VITAMIN D) 1000 UNITS tablet Take 1,000 Units by mouth daily.        Marland Kitchen gentamicin ointment (GARAMYCIN) 0.1 % Apply topically 3 (three) times daily.  15 g  0  . hydrALAZINE  (APRESOLINE) 25 MG tablet Take 1 tablet (25 mg total) by mouth 3 (three) times daily.  90 tablet  6  . metoprolol (TOPROL-XL) 50 MG 24 hr tablet Take 50 mg by mouth daily.        . propafenone (RYTHMOL) 225 MG tablet TAKE 1 TABLET 3 TIMES A DAY  270 tablet  3  . rosuvastatin (CRESTOR) 10 MG tablet Take 1 tablet (10 mg total) by mouth daily.  90 tablet  3  . warfarin (COUMADIN) 2 MG tablet Take 1 tablet (2 mg total) by mouth as directed.  90 tablet  2  . furosemide (LASIX) 20 MG tablet Take 1 tablet (20 mg total) by mouth every other day.  30 tablet  6      Review of Systems  Constitutional: Negative for fever, chills and unexpected weight change.  HENT: Negative for hearing loss, ear pain, nosebleeds, congestion, sore throat, facial swelling, rhinorrhea, sneezing, mouth sores, trouble swallowing, neck pain, neck stiffness, voice change, postnasal drip, sinus pressure, tinnitus and ear discharge.   Eyes: Negative for pain, discharge, redness and visual disturbance.  Respiratory: Negative for cough, chest tightness, shortness of breath, wheezing and stridor.   Cardiovascular: Negative for chest pain, palpitations and leg swelling.  Musculoskeletal: Negative for myalgias and arthralgias.  Skin: Negative for color change and  rash.  Neurological: Negative for dizziness, weakness, light-headedness and headaches.  Hematological: Negative for adenopathy.       Objective:   Physical Exam  Constitutional: She is oriented to person, place, and time. She appears well-developed and well-nourished.  HENT:  Mouth/Throat: Oropharynx is clear and moist.  Eyes: EOM are normal. Pupils are equal, round, and reactive to light. No scleral icterus.  Neck: Normal range of motion. Neck supple. No JVD present. No thyromegaly present.  Cardiovascular: Normal rate, regular rhythm, normal heart sounds and intact distal pulses.   Pulmonary/Chest: Effort normal and breath sounds normal.  Abdominal: Soft. Bowel  sounds are normal. She exhibits no mass. There is no tenderness.  Musculoskeletal: Normal range of motion. She exhibits no edema.  Lymphadenopathy:    She has no cervical adenopathy.  Neurological: She is alert and oriented to person, place, and time.  Skin: Skin is warm and dry.  Psychiatric: She has a normal mood and affect.          Assessment & Plan:

## 2011-02-14 NOTE — Patient Instructions (Addendum)
I would like you to use Eucerin skn cream at bedtime, after your shower,  and continue wear your compression stockings . The best time is before your legs have hit the floor in the morning   I have prescribed atarax use one tablet at bedtime to control your itching, You may use it during the day if necessary    Your toe needs a wider toe box (in your shoe) until you can see Podiatry  .  You are not currently suffering from gout in that toe   You had an brief episode of vertigo.  It if happens again we will repeat the MRI of your brain

## 2011-02-14 NOTE — Telephone Encounter (Signed)
Patient called questioning her rx for the mirapex that was called in. She says that she has been taking the 1.25 and the 0.5 was called in. Is it okay to switch this to 1.25. Please advise.

## 2011-02-14 NOTE — Telephone Encounter (Signed)
Left message for pt to call back needs 3 month follow up

## 2011-02-16 ENCOUNTER — Encounter: Payer: Self-pay | Admitting: Internal Medicine

## 2011-02-16 DIAGNOSIS — M79674 Pain in right toe(s): Secondary | ICD-10-CM | POA: Insufficient documentation

## 2011-02-16 DIAGNOSIS — R42 Dizziness and giddiness: Secondary | ICD-10-CM | POA: Insufficient documentation

## 2011-02-16 NOTE — Assessment & Plan Note (Signed)
Cr is stable by recheck today.  She is using lasix on a prn basis for weight gain.

## 2011-02-16 NOTE — Assessment & Plan Note (Signed)
She has a normal neurologic exam today. Thus far it has been an isolated episode. A repeat occurrence will require workup for CVA again

## 2011-02-16 NOTE — Assessment & Plan Note (Signed)
She continues to use an inferior moisturizer.  Rcecommended Eucerin and atarax for itching/

## 2011-02-16 NOTE — Assessment & Plan Note (Signed)
She has no signs of gout on exam,  Her toe pain nis due to overgrowth og great toe nail and narrow toe box.  Refer to podiatry.

## 2011-02-17 NOTE — Telephone Encounter (Signed)
Madeline Perez can you help with this rx question

## 2011-02-21 NOTE — Telephone Encounter (Signed)
Sorry - this should have been directed to Dr Darrick Huntsman

## 2011-02-26 ENCOUNTER — Encounter: Payer: Medicare Other | Admitting: Emergency Medicine

## 2011-03-05 ENCOUNTER — Ambulatory Visit (INDEPENDENT_AMBULATORY_CARE_PROVIDER_SITE_OTHER): Payer: Medicare Other | Admitting: Emergency Medicine

## 2011-03-05 DIAGNOSIS — I4891 Unspecified atrial fibrillation: Secondary | ICD-10-CM

## 2011-03-31 ENCOUNTER — Ambulatory Visit: Payer: Medicare Other | Admitting: Internal Medicine

## 2011-04-02 ENCOUNTER — Ambulatory Visit (INDEPENDENT_AMBULATORY_CARE_PROVIDER_SITE_OTHER): Payer: Medicare Other | Admitting: Emergency Medicine

## 2011-04-02 DIAGNOSIS — I4891 Unspecified atrial fibrillation: Secondary | ICD-10-CM

## 2011-04-21 ENCOUNTER — Other Ambulatory Visit: Payer: Self-pay | Admitting: Cardiovascular Disease

## 2011-04-21 NOTE — Telephone Encounter (Signed)
Refill for hydralazine 25 mg three times a day.

## 2011-05-14 ENCOUNTER — Ambulatory Visit: Payer: Medicare Other | Admitting: Internal Medicine

## 2011-05-14 ENCOUNTER — Ambulatory Visit (INDEPENDENT_AMBULATORY_CARE_PROVIDER_SITE_OTHER): Payer: Medicare Other

## 2011-05-14 DIAGNOSIS — I4891 Unspecified atrial fibrillation: Secondary | ICD-10-CM

## 2011-05-14 LAB — POCT INR: INR: 2.2

## 2011-05-15 ENCOUNTER — Ambulatory Visit: Payer: Self-pay | Admitting: Internal Medicine

## 2011-05-15 ENCOUNTER — Telehealth: Payer: Self-pay | Admitting: *Deleted

## 2011-05-15 ENCOUNTER — Ambulatory Visit (INDEPENDENT_AMBULATORY_CARE_PROVIDER_SITE_OTHER): Payer: Medicare Other | Admitting: Internal Medicine

## 2011-05-15 ENCOUNTER — Encounter: Payer: Self-pay | Admitting: Internal Medicine

## 2011-05-15 VITALS — BP 130/60 | HR 65 | Temp 98.0°F | Resp 16 | Wt 162.5 lb

## 2011-05-15 DIAGNOSIS — N289 Disorder of kidney and ureter, unspecified: Secondary | ICD-10-CM

## 2011-05-15 DIAGNOSIS — M79609 Pain in unspecified limb: Secondary | ICD-10-CM

## 2011-05-15 DIAGNOSIS — I359 Nonrheumatic aortic valve disorder, unspecified: Secondary | ICD-10-CM

## 2011-05-15 DIAGNOSIS — M79606 Pain in leg, unspecified: Secondary | ICD-10-CM

## 2011-05-15 DIAGNOSIS — R071 Chest pain on breathing: Secondary | ICD-10-CM | POA: Insufficient documentation

## 2011-05-15 DIAGNOSIS — I10 Essential (primary) hypertension: Secondary | ICD-10-CM

## 2011-05-15 NOTE — Assessment & Plan Note (Addendum)
Her lung exam is normal and the pain is reproducible with arm movements, so like due to a strained muscle. I again addressed her need for regular exercise to prevent continue decline in conditioning.  She continues to defer referral for PT but states that she will explore the possibilities at [facility],

## 2011-05-15 NOTE — Telephone Encounter (Signed)
pls let patient know results.  Her thigh pain is from muscle strain

## 2011-05-15 NOTE — Patient Instructions (Signed)
I am ordering an ultrasound of your left thigh  To rule out a blood clot.    Home exercises to strengthen your thighs/legs:  1)Sit on the end of the cough and extend your knee (straighten out your leg) 10 times on each side.  Do three sets every other day   2) Rise from a seated chair without using your arms.  Start with 5 times ,  Work your way to up to 10.     Return for fasting bloodwork including kidney function

## 2011-05-15 NOTE — Progress Notes (Signed)
Patient ID: Madeline Perez, female   DOB: 07-14-1923, 78 y.o.   MRN: [id number] Patient Active Problem List  Diagnoses  . HYPERLIPIDEMIA-MIXED  . HYPERTENSION, BENIGN  . AORTIC STENOSIS  . ATRIAL FIBRILLATION  . EDEMA  . DYSPNEA  . CHEST PAIN-UNSPECIFIED  . Palpitations  . Xerosis of skin  . Renal insufficiency  . Pain of toe of right foot  . Vertigo  . Chest pain with painful respiration  . Leg pain, medial    Subjective:  CC:   Chief Complaint  Patient presents with  . Generalized Body Aches    HPI:   Madeline Perez a 78 y.o. female who presents for follow up on atrial fibrillation, hypertension, and renal insufficiency.  She states that she is not well today.  She has several pain complaints.  Most notable is a one week history of right sided chest wall pain aggravated by deep breathing and by raising her right arm.  The pain starts in the mid axillary line , wraps around to under her shoulder blade and down her lateral thorax.  She denies a  history of recent cough or dyspnea.    No recent travel or immobilization.  She leaves a very sedentary life and does not exercise .  The pain began suddenly after getting up in the middle of the night.  Her second pain has occurred in her left inner thigh and is nonpalpable and aggravated by hip flexion and adduction.  It has been present for a similar length of time. She has no history of trauma or fall, and no recent unusual activity.    Past Medical History  Diagnosis Date  . Atrial fibrillation   . Hypertension   . Hyperlipidemia   . Aortic stenosis     mild to moderate with estimated valve area of 1.2 cm in 2007  . Macular degeneration   . Chronic renal insufficiency     Past Surgical History  Procedure Date  . Cholecystectomy 03/28/1998         The following portions of the patient's history were reviewed and updated as appropriate: Allergies, current medications, and problem list.    Review of Systems:   12 Pt   review of systems was negative except those addressed in the HPI,     History   Social History  . Marital Status: Single    Spouse Name: N/A    Number of Children: N/A  . Years of Education: N/A   Occupational History  . Not on file.   Social History Main Topics  . Smoking status: Never Smoker   . Smokeless tobacco: Never Used  . Alcohol Use: No  . Drug Use: No  . Sexually Active: Not on file   Other Topics Concern  . Not on file   Social History Narrative  . No narrative on file    Objective:  BP 130/60  Pulse 65  Temp(Src) 98 F (36.7 C) (Oral)  Resp 16  Wt 162 lb 8 oz (73.71 kg)  SpO2 99%  General appearance: alert, cooperative and appears stated age Ears: normal TM's and external ear canals both ears Throat: lips, mucosa, and tongue normal; teeth and gums normal Neck: no adenopathy, no carotid bruit, supple, symmetrical, trachea midline and thyroid not enlarged, symmetric, no tenderness/mass/nodules Back: symmetric, no curvature. ROM normal. No CVA tenderness. Lungs: clear to auscultation bilaterally Heart: regular rate and rhythm, S1, S2 normal,  Grade 3/6 systolic murmur.  No click, rub or gallop Abdomen:  soft, non-tender; bowel sounds normal; no masses,  no organomegaly Pulses: 2+ and symmetric Skin:  Dry. No rashes or lesions. Nonpitting edema of lower extremities noted.  Lymph nodes: Cervical, supraclavicular, and axillary nodes normal.  Assessment and Plan:  Chest pain with painful respiration Her lung exam is normal and the pain is reproducible with arm movements, so like due to a strained muscle. I again addressed her need for regular exercise to prevent continue decline in conditioning.  She continues to defer referral for PT but states that she will explore the possibilities at [facility],   Leg pain, medial Given her sedentary lifestyle and concurrent chest pain, I ruled out DVT with a venous doppler.  Recommended regular exercise as again,  her pain appears to be due to musculoskeletal in nature.  Prn tramadol.   HYPERTENSION, BENIGN Well controlled on current medications.  No changes today. Renal function is stable her last checked in December. We'll repeat in June.  AORTIC STENOSIS Monitored with serial echocardiograms by Dr. Noel Gerold her stenosis is moderate accompanied by diastolic dysfunction pulmonary hypertension both of which cause some dyspnea with exertion but no desaturations are noted.  Renal insufficiency Mild, aggravated by prior use of furosemide for treatment of lower extremity edema which which is due to venous insufficiency and therefore not amenable to diuresis. She is no longer using Lasix on a daily basis.    Updated Medication List Outpatient Encounter Prescriptions as of 05/15/2011  Medication Sig Dispense Refill  . allopurinol (ZYLOPRIM) 100 MG tablet Take 1 tablet (100 mg total) by mouth daily.  30 tablet  3  . amLODipine (NORVASC) 5 MG tablet Take 5 mg by mouth daily.        . beta carotene w/minerals (OCUVITE) tablet Take 1 tablet by mouth daily.        . cholecalciferol (VITAMIN D) 1000 UNITS tablet Take 1,000 Units by mouth daily.        . furosemide (LASIX) 20 MG tablet Take 1 tablet (20 mg total) by mouth every other day.  30 tablet  6  . hydrALAZINE (APRESOLINE) 25 MG tablet TAKE 1 TABLET BY MOUTH 3 TIMES A DAY  90 tablet  6  . metoprolol (TOPROL-XL) 50 MG 24 hr tablet Take 50 mg by mouth daily.        . pramipexole (MIRAPEX) 0.25 MG tablet Take 1 tablet (0.25 mg total) by mouth at bedtime.  90 tablet  1  . propafenone (RYTHMOL) 225 MG tablet TAKE 1 TABLET 3 TIMES A DAY  270 tablet  3  . rosuvastatin (CRESTOR) 10 MG tablet Take 1 tablet (10 mg total) by mouth daily.  90 tablet  3  . warfarin (COUMADIN) 2 MG tablet Take 1 tablet (2 mg total) by mouth as directed.  90 tablet  2  . hydrocortisone valerate ointment (WEST-CORT) 0.2 %       . hydrOXYzine (ATARAX/VISTARIL) 50 MG tablet       .  triamcinolone ointment (KENALOG) 0.1 %       . DISCONTD: gentamicin ointment (GARAMYCIN) 0.1 % Apply topically 3 (three) times daily.  15 g  0     Orders Placed This Encounter  Procedures  . Lower Extremity Venous Duplex Left    No Follow-up on file.

## 2011-05-15 NOTE — Telephone Encounter (Signed)
Kim from [facility] called with results for patient's venous doppler. It was neg for DVT.

## 2011-05-16 NOTE — Telephone Encounter (Signed)
Patient notified of results.

## 2011-05-18 ENCOUNTER — Encounter: Payer: Self-pay | Admitting: Internal Medicine

## 2011-05-18 DIAGNOSIS — M79606 Pain in leg, unspecified: Secondary | ICD-10-CM | POA: Insufficient documentation

## 2011-05-18 NOTE — Assessment & Plan Note (Signed)
Given her sedentary lifestyle and concurrent chest pain, I ruled out DVT with a venous doppler.  Recommended regular exercise as again, her pain appears to be due to musculoskeletal in nature.  Prn tramadol.

## 2011-05-18 NOTE — Assessment & Plan Note (Signed)
Monitored with serial echocardiograms by Dr. Noel Gerold her stenosis is moderate accompanied by diastolic dysfunction pulmonary hypertension both of which cause some dyspnea with exertion but no desaturations are noted.

## 2011-05-18 NOTE — Assessment & Plan Note (Signed)
Mild, aggravated by prior use of furosemide for treatment of lower extremity edema which which is due to venous insufficiency and therefore not amenable to diuresis. She is no longer using Lasix on a daily basis.

## 2011-05-18 NOTE — Assessment & Plan Note (Addendum)
Well controlled on current medications.  No changes today. Renal function is stable her last checked in December. We'll repeat in June.

## 2011-05-19 ENCOUNTER — Other Ambulatory Visit (INDEPENDENT_AMBULATORY_CARE_PROVIDER_SITE_OTHER): Payer: Medicare Other | Admitting: *Deleted

## 2011-05-19 DIAGNOSIS — Z7901 Long term (current) use of anticoagulants: Secondary | ICD-10-CM

## 2011-05-19 LAB — PROTIME-INR: INR: 2 ratio — ABNORMAL HIGH (ref 0.8–1.0)

## 2011-05-30 ENCOUNTER — Encounter: Payer: Self-pay | Admitting: Internal Medicine

## 2011-06-03 ENCOUNTER — Telehealth: Payer: Self-pay | Admitting: Internal Medicine

## 2011-06-03 DIAGNOSIS — E785 Hyperlipidemia, unspecified: Secondary | ICD-10-CM

## 2011-06-03 DIAGNOSIS — R5383 Other fatigue: Secondary | ICD-10-CM

## 2011-06-03 NOTE — Telephone Encounter (Signed)
Patient called and stated you wanted her to come back in for fasting labs, but there is no order.  What would you like patient to have drawn.  Please advise.

## 2011-06-03 NOTE — Telephone Encounter (Signed)
Order placed. Sorry!

## 2011-06-04 ENCOUNTER — Ambulatory Visit: Payer: Medicare Other | Admitting: Cardiovascular Disease

## 2011-06-05 ENCOUNTER — Other Ambulatory Visit (INDEPENDENT_AMBULATORY_CARE_PROVIDER_SITE_OTHER): Payer: Medicare Other | Admitting: *Deleted

## 2011-06-05 DIAGNOSIS — R5383 Other fatigue: Secondary | ICD-10-CM

## 2011-06-05 DIAGNOSIS — E785 Hyperlipidemia, unspecified: Secondary | ICD-10-CM

## 2011-06-05 LAB — COMPREHENSIVE METABOLIC PANEL
ALT: 8 U/L (ref 0–35)
Albumin: 3.8 g/dL (ref 3.5–5.2)
CO2: 24 mEq/L (ref 19–32)
Calcium: 9.2 mg/dL (ref 8.4–10.5)
Chloride: 107 mEq/L (ref 96–112)
GFR: 44.91 mL/min — ABNORMAL LOW (ref >60.00)
Glucose, Bld: 86 mg/dL (ref 70–99)
Sodium: 140 mEq/L (ref 135–145)
Total Protein: 7.2 g/dL (ref 6.0–8.3)

## 2011-06-05 LAB — TSH: TSH: 1.02 u[IU]/mL (ref 0.35–5.50)

## 2011-06-05 LAB — LIPID PANEL: HDL: 54.3 mg/dL (ref >39.00)

## 2011-06-24 ENCOUNTER — Other Ambulatory Visit: Payer: Self-pay | Admitting: Internal Medicine

## 2011-06-25 ENCOUNTER — Ambulatory Visit: Payer: Medicare Other | Admitting: Cardiovascular Disease

## 2011-06-25 ENCOUNTER — Ambulatory Visit (INDEPENDENT_AMBULATORY_CARE_PROVIDER_SITE_OTHER): Payer: Medicare Other

## 2011-06-25 DIAGNOSIS — I4891 Unspecified atrial fibrillation: Secondary | ICD-10-CM

## 2011-06-25 LAB — POCT INR: INR: 2

## 2011-06-26 ENCOUNTER — Encounter: Payer: Self-pay | Admitting: Cardiovascular Disease

## 2011-06-26 ENCOUNTER — Ambulatory Visit (INDEPENDENT_AMBULATORY_CARE_PROVIDER_SITE_OTHER): Payer: Medicare Other | Admitting: Cardiovascular Disease

## 2011-06-26 VITALS — BP 152/68 | HR 64 | Ht 64.0 in | Wt 162.0 lb

## 2011-06-26 DIAGNOSIS — N289 Disorder of kidney and ureter, unspecified: Secondary | ICD-10-CM

## 2011-06-26 DIAGNOSIS — I4891 Unspecified atrial fibrillation: Secondary | ICD-10-CM

## 2011-06-26 DIAGNOSIS — R002 Palpitations: Secondary | ICD-10-CM

## 2011-06-26 DIAGNOSIS — I359 Nonrheumatic aortic valve disorder, unspecified: Secondary | ICD-10-CM

## 2011-06-26 DIAGNOSIS — I10 Essential (primary) hypertension: Secondary | ICD-10-CM

## 2011-06-26 DIAGNOSIS — I350 Nonrheumatic aortic (valve) stenosis: Secondary | ICD-10-CM

## 2011-06-26 NOTE — Assessment & Plan Note (Signed)
She continues to have episodes of paroxysmal atrial fibrillation. We have suggested she take extra half dose of metoprolol as needed. We have asked her to track the number of episodes. We could try diltiazem 30 mg when necessary if she would like. Lastly, we could change her antiarrhythmic medication if symptoms get worse. She is happy to monitor her symptoms for now.

## 2011-06-26 NOTE — Progress Notes (Signed)
Patient ID: Madeline Perez, female    DOB: [birth year], 78 y.o.   MRN: [id number]  HPI Comments: Madeline Perez is a 78 year old woman with history of paroxysmal atrial fibrillation, moderate aortic valve stenosis, chronic lower extremity edema, chronic renal insufficiency, Diastolic dysfunction, pulmonary hypertension who presents for routine followup.   She continues to have episodes of paroxysmal atrial fibrillation sometimes lasting for several hours. She is uncertain how frequently these happened. It is mildly uncomfortable for her. Sometimes it happens at nighttime and she takes extra metoprolol. Otherwise she feels well. Her husband has worsening memory issues having to do more such as driving. His has caused extra stress. She's not currently taking diuretics. Recent lab work shows creatinine 1.2.   echocardiogram done in 07/2009 Moderate aortic valve stenosis with mildly elevated right ventricular systolic pressure consistent with mild pulmonary hypertension. Mean aortic valve gradient is 21 mmHg, peak gradient of 42 mmHg, estimated aortic valve area 1.0 cm, normal LV and RV size and function, mild to moderate aortic valve regurgitation, mildly dilated left atrium and right atrium. Evidence of diastolic relaxation abnormality.   EKG shows normal sinus rhythm with rate of 62 beats per minute, left anterior fascicular block, intraventricular conduction delay, poor R-wave progression through the anterior precordial leads    Outpatient Encounter Prescriptions as of 06/26/2011  Medication Sig Dispense Refill  . allopurinol (ZYLOPRIM) 100 MG tablet       . amLODipine (NORVASC) 5 MG tablet Take 5 mg by mouth daily.        . beta carotene w/minerals (OCUVITE) tablet Take 1 tablet by mouth daily.        . cholecalciferol (VITAMIN D) 1000 UNITS tablet Take 1,000 Units by mouth daily.        . hydrALAZINE (APRESOLINE) 25 MG tablet       . hydrocortisone valerate ointment (WEST-CORT) 0.2 %       .  hydrOXYzine (ATARAX/VISTARIL) 50 MG tablet       . metoprolol (TOPROL-XL) 50 MG 24 hr tablet Take 50 mg by mouth daily.        . pramipexole (MIRAPEX) 0.25 MG tablet Take 1 tablet (0.25 mg total) by mouth at bedtime.  90 tablet  1  . propafenone (RYTHMOL) 225 MG tablet TAKE 1 TABLET 3 TIMES A DAY  270 tablet  3  . rosuvastatin (CRESTOR) 10 MG tablet Take 1 tablet (10 mg total) by mouth daily.  90 tablet  3  . triamcinolone ointment (KENALOG) 0.1 %       . warfarin (COUMADIN) 2 MG tablet Take 1 tablet (2 mg total) by mouth as directed.  90 tablet  2   Review of Systems  Constitutional: Negative.   HENT: Negative.   Eyes: Negative.   Respiratory: Negative.   Cardiovascular: Positive for palpitations.  Gastrointestinal: Negative.   Musculoskeletal: Negative.   Skin: Negative.   Neurological: Negative.   Hematological: Negative.   Psychiatric/Behavioral: Negative.   All other systems reviewed and are negative.    BP 152/68  Pulse 64  Ht 5\' 4"  (1.626 m)  Wt 162 lb (73.483 kg)  BMI 27.81 kg/m2  Physical Exam  Nursing note and vitals reviewed. Constitutional: She is oriented to person, place, and time. She appears well-developed and well-nourished.  HENT:  Head: Normocephalic.  Nose: Nose normal.  Mouth/Throat: Oropharynx is clear and moist.  Eyes: Conjunctivae are normal. Pupils are equal, round, and reactive to light.  Neck: Normal range of motion. Neck supple. No  JVD present.  Cardiovascular: Normal rate, regular rhythm, S1 normal, S2 normal and intact distal pulses.  Exam reveals no gallop and no friction rub.   Murmur heard.  Crescendo systolic murmur is present with a grade of 3/6  Pulmonary/Chest: Effort normal and breath sounds normal. No respiratory distress. She has no wheezes. She has no rales. She exhibits no tenderness.  Abdominal: Soft. Bowel sounds are normal. She exhibits no distension. There is no tenderness.  Musculoskeletal: Normal range of motion. She exhibits  no edema and no tenderness.  Lymphadenopathy:    She has no cervical adenopathy.  Neurological: She is alert and oriented to person, place, and time. Coordination normal.  Skin: Skin is warm and dry. No rash noted. No erythema.  Psychiatric: She has a normal mood and affect. Her behavior is normal. Judgment and thought content normal.         Assessment and Plan

## 2011-06-26 NOTE — Patient Instructions (Signed)
You are doing well. No medication changes were made.  We will schedule an echocardiogram to check the aortic valve stenosis  Please call us if you have new issues that need to be addressed before your next appt.  Your physician wants you to follow-up in: 6 months.  You will receive a reminder letter in the mail two months in advance. If you don't receive a letter, please call our office to schedule the follow-up appointment.

## 2011-06-26 NOTE — Assessment & Plan Note (Signed)
We have asked her to closely monitor her blood pressure at home. Periods of hypertension could be contributing to paroxysmal atrial fibrillation.

## 2011-06-26 NOTE — Assessment & Plan Note (Signed)
Moderate aortic valve stenosis by echocardiogram 2 years ago. Repeat echocardiogram has been ordered given mild increase in her episodes of atrial fibrillation.

## 2011-06-26 NOTE — Assessment & Plan Note (Signed)
Renal insufficiency has resolved by holding Lasix. Edema is not impressive.

## 2011-07-13 ENCOUNTER — Other Ambulatory Visit: Payer: Self-pay | Admitting: Cardiovascular Disease

## 2011-07-14 NOTE — Telephone Encounter (Signed)
Please Refill

## 2011-07-17 ENCOUNTER — Emergency Department: Payer: Self-pay | Admitting: Emergency Medicine

## 2011-07-17 LAB — TROPONIN I: Troponin-I: 0.04 ng/mL

## 2011-07-17 LAB — BASIC METABOLIC PANEL
Anion Gap: 8 (ref 7–16)
Chloride: 105 mmol/L (ref 98–107)
EGFR (African American): 39 — ABNORMAL LOW
EGFR (Non-African Amer.): 34 — ABNORMAL LOW
Glucose: 139 mg/dL — ABNORMAL HIGH (ref 65–99)
Sodium: 141 mmol/L (ref 136–145)

## 2011-07-17 LAB — CBC
HCT: 35.6 % (ref 35.0–47.0)
HGB: 11.7 g/dL — ABNORMAL LOW (ref 12.0–16.0)
MCH: 30.2 pg (ref 26.0–34.0)
MCHC: 32.8 g/dL (ref 32.0–36.0)
MCV: 92 fL (ref 80–100)
Platelet: 204 10*3/uL (ref 150–440)
WBC: 6.8 10*3/uL (ref 3.6–11.0)

## 2011-07-17 LAB — PROTIME-INR: Prothrombin Time: 19 secs — ABNORMAL HIGH (ref 11.5–14.7)

## 2011-07-29 ENCOUNTER — Other Ambulatory Visit: Payer: Self-pay

## 2011-07-29 ENCOUNTER — Other Ambulatory Visit (INDEPENDENT_AMBULATORY_CARE_PROVIDER_SITE_OTHER): Payer: Medicare Other

## 2011-07-29 DIAGNOSIS — I4891 Unspecified atrial fibrillation: Secondary | ICD-10-CM

## 2011-07-29 DIAGNOSIS — I359 Nonrheumatic aortic valve disorder, unspecified: Secondary | ICD-10-CM

## 2011-07-29 DIAGNOSIS — I350 Nonrheumatic aortic (valve) stenosis: Secondary | ICD-10-CM

## 2011-07-29 DIAGNOSIS — I059 Rheumatic mitral valve disease, unspecified: Secondary | ICD-10-CM

## 2011-07-31 ENCOUNTER — Telehealth: Payer: Self-pay | Admitting: Internal Medicine

## 2011-07-31 DIAGNOSIS — E785 Hyperlipidemia, unspecified: Secondary | ICD-10-CM

## 2011-07-31 MED ORDER — ROSUVASTATIN CALCIUM 10 MG PO TABS: 10.0000 mg | ORAL_TABLET | Freq: Every day | ORAL | Status: DC

## 2011-07-31 NOTE — Telephone Encounter (Signed)
Yes refill the crestor at #30  Per refill 6 refills .  She needs to stay on the allopurinol unless she wants the gout to return

## 2011-07-31 NOTE — Telephone Encounter (Signed)
I have sent in patients Crestor, she wants to know if she should still continue taking allopurinol.  Please advise Also I never received patients VM from Friday.

## 2011-07-31 NOTE — Telephone Encounter (Signed)
Patient has a question in regards to Crestor 10 mg, she wants to know if she can get 30 vs 90 because of the cost.  Allopurinol medication for gout, she wants to know if she still needs to continue taking this.  She states that she left a vm on Friday and call hasn't been returned.

## 2011-08-01 NOTE — Telephone Encounter (Signed)
Patient notified

## 2011-08-05 ENCOUNTER — Other Ambulatory Visit: Payer: Self-pay

## 2011-08-05 MED ORDER — FUROSEMIDE 40 MG PO TABS: 40.0000 mg | ORAL_TABLET | ORAL | Status: DC

## 2011-08-06 ENCOUNTER — Ambulatory Visit (INDEPENDENT_AMBULATORY_CARE_PROVIDER_SITE_OTHER): Payer: Medicare Other

## 2011-08-06 ENCOUNTER — Telehealth: Payer: Self-pay | Admitting: Cardiovascular Disease

## 2011-08-06 DIAGNOSIS — I4891 Unspecified atrial fibrillation: Secondary | ICD-10-CM

## 2011-08-06 NOTE — Telephone Encounter (Signed)
LMTCB

## 2011-08-06 NOTE — Telephone Encounter (Signed)
Patient would like RN to call her to discuss her test results

## 2011-08-06 NOTE — Telephone Encounter (Signed)
Pt called back. She tells me when she was in hospital last time, they "heard a murmur". She wants to know if echo revealed any new info re: this. I explained the fact that she has moderate valve stenosis (known) would be associated with murmur and is probably not a new finding. She has yet to start the lasix 3 days a week but will begin this today.

## 2011-08-07 ENCOUNTER — Other Ambulatory Visit: Payer: Self-pay | Admitting: Internal Medicine

## 2011-08-16 ENCOUNTER — Other Ambulatory Visit: Payer: Self-pay | Admitting: Internal Medicine

## 2011-08-17 ENCOUNTER — Other Ambulatory Visit: Payer: Self-pay | Admitting: Internal Medicine

## 2011-09-01 ENCOUNTER — Other Ambulatory Visit: Payer: Self-pay | Admitting: Cardiovascular Disease

## 2011-09-01 NOTE — Telephone Encounter (Signed)
Refilled PROPAFENONE HCL.

## 2011-09-03 ENCOUNTER — Ambulatory Visit (INDEPENDENT_AMBULATORY_CARE_PROVIDER_SITE_OTHER): Payer: Medicare Other

## 2011-09-03 DIAGNOSIS — I4891 Unspecified atrial fibrillation: Secondary | ICD-10-CM

## 2011-09-22 ENCOUNTER — Telehealth: Payer: Self-pay | Admitting: Internal Medicine

## 2011-09-22 ENCOUNTER — Other Ambulatory Visit (INDEPENDENT_AMBULATORY_CARE_PROVIDER_SITE_OTHER): Payer: Medicare Other | Admitting: *Deleted

## 2011-09-22 DIAGNOSIS — Z7901 Long term (current) use of anticoagulants: Secondary | ICD-10-CM

## 2011-09-22 NOTE — Telephone Encounter (Signed)
Fax [phone]  Shanda Bumps @ Dr Francisca December office called to lab results for pt/inr that ms Mccay had today

## 2011-09-22 NOTE — Telephone Encounter (Signed)
Will fax results over once labs come in.

## 2011-10-01 ENCOUNTER — Ambulatory Visit (INDEPENDENT_AMBULATORY_CARE_PROVIDER_SITE_OTHER): Payer: Medicare Other

## 2011-10-01 DIAGNOSIS — I4891 Unspecified atrial fibrillation: Secondary | ICD-10-CM

## 2011-10-14 ENCOUNTER — Ambulatory Visit (INDEPENDENT_AMBULATORY_CARE_PROVIDER_SITE_OTHER): Payer: Medicare Other | Admitting: Internal Medicine

## 2011-10-14 ENCOUNTER — Encounter: Payer: Self-pay | Admitting: Internal Medicine

## 2011-10-14 VITALS — BP 156/64 | HR 66 | Temp 98.0°F | Resp 16 | Wt 162.2 lb

## 2011-10-14 DIAGNOSIS — M2141 Flat foot [pes planus] (acquired), right foot: Secondary | ICD-10-CM

## 2011-10-14 DIAGNOSIS — R609 Edema, unspecified: Secondary | ICD-10-CM

## 2011-10-14 DIAGNOSIS — M2142 Flat foot [pes planus] (acquired), left foot: Secondary | ICD-10-CM

## 2011-10-14 DIAGNOSIS — M2140 Flat foot [pes planus] (acquired), unspecified foot: Secondary | ICD-10-CM

## 2011-10-14 DIAGNOSIS — I4891 Unspecified atrial fibrillation: Secondary | ICD-10-CM

## 2011-10-14 DIAGNOSIS — F411 Generalized anxiety disorder: Secondary | ICD-10-CM

## 2011-10-14 DIAGNOSIS — I10 Essential (primary) hypertension: Secondary | ICD-10-CM

## 2011-10-14 DIAGNOSIS — F419 Anxiety disorder, unspecified: Secondary | ICD-10-CM | POA: Insufficient documentation

## 2011-10-14 MED ORDER — AMLODIPINE BESYLATE 10 MG PO TABS: 10.0000 mg | ORAL_TABLET | Freq: Every day | ORAL | Status: DC

## 2011-10-14 NOTE — Progress Notes (Signed)
Patient ID: Madeline Perez, female   DOB: 06/13/1923, 78 y.o.   MRN: [id number]  Patient Active Problem List  Diagnosis  . HYPERLIPIDEMIA-MIXED  . HYPERTENSION, BENIGN  . ATRIAL FIBRILLATION  . EDEMA  . DYSPNEA  . CHEST PAIN-UNSPECIFIED  . Palpitations  . Xerosis of skin  . Renal insufficiency  . Pain of toe of right foot  . Vertigo  . Chest pain with painful respiration  . Leg pain, medial  . Aortic valve stenosis  . Anxiety    Subjective:  CC:   Chief Complaint  Patient presents with  . Hypertension    HPI:   Madeline Perez a 78 y.o. female who presents with elevated blood pressures.  She has cited Increased stressors  And increased intake in salt as factors. Had a headache, pulsating,  Checked bp and it was 186/80.   Stressors included husband being  diagnosed with dementia. She feels an increased responsibility to remain healthy for both of them.  (Husband is 66) Son lives in [place] and [facility] live at [facility] .  2) Bilateral foot pain for the last few weeks present with both with walking and sitting. No redness, trauma .    Past Medical History  Diagnosis Date  . Atrial fibrillation   . Hypertension   . Hyperlipidemia   . Aortic stenosis     mild to moderate with estimated valve area of 1.2 cm in 2007  . Macular degeneration   . Chronic renal insufficiency     Past Surgical History  Procedure Date  . Cholecystectomy 03/28/1998         The following portions of the patient's history were reviewed and updated as appropriate: Allergies, current medications, and problem list.    Review of Systems:   A comprehensive ROS was done and positive for anxiety and foot pain. The rest was negative.     History   Social History  . Marital Status: Single    Spouse Name: N/A    Number of Children: N/A  . Years of Education: N/A   Occupational History  . Not on file.   Social History Main Topics  . Smoking status: Never Smoker   .  Smokeless tobacco: Never Used  . Alcohol Use: No  . Drug Use: No  . Sexually Active: Not on file   Other Topics Concern  . Not on file   Social History Narrative  . No narrative on file    Objective:  BP 156/64  Pulse 66  Temp 98 F (36.7 C) (Oral)  Resp 16  Wt 162 lb 4 oz (73.596 kg)  SpO2 96%  General appearance: alert, cooperative and appears stated age Ears: normal TM's and external ear canals both ears Throat: lips, mucosa, and tongue normal; teeth and gums normal Neck: no adenopathy, no carotid bruit, supple, symmetrical, trachea midline and thyroid not enlarged, symmetric, no tenderness/mass/nodules Back: symmetric, no curvature. ROM normal. No CVA tenderness. Lungs: clear to auscultation bilaterally Heart: regular rate and rhythm, S1, S2 normal, no murmur, click, rub or gallop Abdomen: soft, non-tender; bowel sounds normal; no masses,  no organomegaly Pulses: 2+ and symmetric Skin: Skin color, texture, turgor normal. No rashes or lesions Lymph nodes: Cervical, supraclavicular, and axillary nodes normal.  Assessment and Plan:  HYPERTENSION, BENIGN Elevated today , may be transient due to anxiety and increased salt intake.  Advised to increased amlodipine to 10 as needed for systolic > 150.    ATRIAL FIBRILLATION Well controlled  on current regimen.  no changes today.  EDEMA Secondary to venous insufficiency.  contrlled with use of stockings and prn use of furosemide.   Anxiety Spent 15 minutes discussing sources of her anxiety surrounding her husband's diagnosis of dementia. Discussed caregiver burnout, explored her support system, etc. She does not require pharmacotherapy at this point.   Pes planus (flat feet) Foot exam was negative for callouses and signs of gout.  She does have  flat feet and bunions.  Referral to podiatry    Updated Medication List Outpatient Encounter Prescriptions as of 10/14/2011  Medication Sig Dispense Refill  . allopurinol  (ZYLOPRIM) 100 MG tablet       . amLODipine (NORVASC) 10 MG tablet Take 1 tablet (10 mg total) by mouth daily.  90 tablet  2  . beta carotene w/minerals (OCUVITE) tablet Take 1 tablet by mouth daily.        . cholecalciferol (VITAMIN D) 1000 UNITS tablet Take 1,000 Units by mouth daily.        . hydrALAZINE (APRESOLINE) 25 MG tablet       . hydrocortisone valerate ointment (WEST-CORT) 0.2 %       . hydrOXYzine (ATARAX/VISTARIL) 50 MG tablet       . metoprolol succinate (TOPROL-XL) 50 MG 24 hr tablet TAKE 1 TABLET EVERY DAY  90 tablet  3  . pramipexole (MIRAPEX) 0.25 MG tablet TAKE 1 TABLET BY MOUTH AT BEDTIME.  90 tablet  1  . propafenone (RYTHMOL) 225 MG tablet TAKE 1 TABLET 3 TIMES A DAY  270 tablet  3  . rosuvastatin (CRESTOR) 10 MG tablet Take 1 tablet (10 mg total) by mouth daily.  30 tablet  3  . triamcinolone ointment (KENALOG) 0.1 %       . warfarin (COUMADIN) 2 MG tablet TAKE 1 TABLET BY MOUTH AS DIRECTED  90 tablet  1  . DISCONTD: amLODipine (NORVASC) 5 MG tablet TAKE 1 TABLET BY MOUTH EVERY DAY  90 tablet  2  . DISCONTD: furosemide (LASIX) 40 MG tablet Take 40 mg by mouth 3 (three) times a week. As needed

## 2011-10-14 NOTE — Assessment & Plan Note (Signed)
Foot exam was negative for callouses and signs of gout.  She does have  flat feet and bunions.  Referral to podiatry

## 2011-10-14 NOTE — Assessment & Plan Note (Signed)
Elevated today , may be transient due to anxiety and increased salt intake.  Advised to increased amlodipine to 10 as needed for systolic > 150.

## 2011-10-14 NOTE — Assessment & Plan Note (Signed)
Well controlled on current regimen.  no changes today.   

## 2011-10-14 NOTE — Assessment & Plan Note (Signed)
Spent 15 minutes discussing sources of her anxiety surrounding her husband's diagnosis of dementia. Discussed caregiver burnout, explored her support system, etc. She does not require pharmacotherapy at this point.

## 2011-10-14 NOTE — Assessment & Plan Note (Signed)
Secondary to venous insufficiency.  contrlled with use of stockings and prn use of furosemide.

## 2011-10-14 NOTE — Patient Instructions (Addendum)
We are increasing your amlodipine to 10 mg daily for your blood pressure.    You may want to resume the furosemide every 3 days or so for fluid retention as needed.

## 2011-10-30 ENCOUNTER — Other Ambulatory Visit: Payer: Self-pay | Admitting: Internal Medicine

## 2011-11-05 ENCOUNTER — Ambulatory Visit (INDEPENDENT_AMBULATORY_CARE_PROVIDER_SITE_OTHER): Payer: Medicare Other

## 2011-11-05 DIAGNOSIS — I4891 Unspecified atrial fibrillation: Secondary | ICD-10-CM

## 2011-11-05 LAB — POCT INR: INR: 1.8

## 2011-11-19 ENCOUNTER — Ambulatory Visit (INDEPENDENT_AMBULATORY_CARE_PROVIDER_SITE_OTHER): Payer: Medicare Other

## 2011-11-19 DIAGNOSIS — I4891 Unspecified atrial fibrillation: Secondary | ICD-10-CM

## 2011-12-07 ENCOUNTER — Inpatient Hospital Stay: Payer: Self-pay | Admitting: Specialist

## 2011-12-07 LAB — URINALYSIS, COMPLETE
Leukocyte Esterase: NEGATIVE
Nitrite: NEGATIVE
Ph: 5 (ref 4.5–8.0)
Protein: 100
Specific Gravity: 1.02 (ref 1.003–1.030)

## 2011-12-07 LAB — MAGNESIUM: Magnesium: 1.8 mg/dL

## 2011-12-07 LAB — CBC
HCT: 34.7 % — ABNORMAL LOW (ref 35.0–47.0)
MCHC: 34 g/dL (ref 32.0–36.0)
Platelet: 189 10*3/uL (ref 150–440)
RDW: 14.9 % — ABNORMAL HIGH (ref 11.5–14.5)
WBC: 15.5 10*3/uL — ABNORMAL HIGH (ref 3.6–11.0)

## 2011-12-07 LAB — BASIC METABOLIC PANEL
BUN: 34 mg/dL — ABNORMAL HIGH (ref 7–18)
Creatinine: 1.56 mg/dL — ABNORMAL HIGH (ref 0.60–1.30)
EGFR (African American): 35 — ABNORMAL LOW
EGFR (Non-African Amer.): 30 — ABNORMAL LOW
Glucose: 135 mg/dL — ABNORMAL HIGH (ref 65–99)
Potassium: 4.4 mmol/L (ref 3.5–5.1)
Sodium: 139 mmol/L (ref 136–145)

## 2011-12-07 LAB — TROPONIN I
Troponin-I: 0.03 ng/mL
Troponin-I: 0.51 ng/mL — ABNORMAL HIGH

## 2011-12-07 LAB — CK TOTAL AND CKMB (NOT AT ARMC)
CK-MB: 1 ng/mL (ref 0.5–3.6)
CK-MB: 3.2 ng/mL (ref 0.5–3.6)

## 2011-12-07 LAB — PRO B NATRIURETIC PEPTIDE: B-Type Natriuretic Peptide: 8492 pg/mL — ABNORMAL HIGH (ref 0–450)

## 2011-12-07 LAB — TSH: Thyroid Stimulating Horm: 1.94 u[IU]/mL

## 2011-12-07 LAB — PROTIME-INR: Prothrombin Time: 29.2 secs — ABNORMAL HIGH (ref 11.5–14.7)

## 2011-12-08 DIAGNOSIS — I059 Rheumatic mitral valve disease, unspecified: Secondary | ICD-10-CM

## 2011-12-08 DIAGNOSIS — I5033 Acute on chronic diastolic (congestive) heart failure: Secondary | ICD-10-CM

## 2011-12-08 LAB — CBC WITH DIFFERENTIAL/PLATELET
Basophil #: 0.1 10*3/uL (ref 0.0–0.1)
Basophil %: 0.9 %
Eosinophil %: 0.1 %
HGB: 11.2 g/dL — ABNORMAL LOW (ref 12.0–16.0)
Lymphocyte %: 2.6 %
MCH: 31.3 pg (ref 26.0–34.0)
MCHC: 33.5 g/dL (ref 32.0–36.0)
MCV: 93 fL (ref 80–100)
Monocyte #: 0.9 x10 3/mm (ref 0.2–0.9)
Monocyte %: 5.7 %
Neutrophil %: 90.7 %
Platelet: 173 10*3/uL (ref 150–440)
RBC: 3.58 10*6/uL — ABNORMAL LOW (ref 3.80–5.20)

## 2011-12-08 LAB — BASIC METABOLIC PANEL
Anion Gap: 10 (ref 7–16)
BUN: 37 mg/dL — ABNORMAL HIGH (ref 7–18)
Co2: 27 mmol/L (ref 21–32)
Creatinine: 1.47 mg/dL — ABNORMAL HIGH (ref 0.60–1.30)
EGFR (African American): 37 — ABNORMAL LOW
EGFR (Non-African Amer.): 32 — ABNORMAL LOW
Glucose: 126 mg/dL — ABNORMAL HIGH (ref 65–99)

## 2011-12-08 LAB — CK TOTAL AND CKMB (NOT AT ARMC)
CK, Total: 124 U/L (ref 21–215)
CK-MB: 3.2 ng/mL (ref 0.5–3.6)

## 2011-12-08 LAB — PROTIME-INR
INR: 3.2
Prothrombin Time: 33.1 secs — ABNORMAL HIGH (ref 11.5–14.7)

## 2011-12-08 LAB — TROPONIN I: Troponin-I: 0.54 ng/mL — ABNORMAL HIGH

## 2011-12-09 LAB — BASIC METABOLIC PANEL
BUN: 35 mg/dL — ABNORMAL HIGH (ref 7–18)
EGFR (African American): 35 — ABNORMAL LOW
EGFR (Non-African Amer.): 30 — ABNORMAL LOW
Glucose: 91 mg/dL (ref 65–99)
Osmolality: 287 (ref 275–301)
Potassium: 2.9 mmol/L — ABNORMAL LOW (ref 3.5–5.1)
Sodium: 140 mmol/L (ref 136–145)

## 2011-12-09 LAB — CBC WITH DIFFERENTIAL/PLATELET
Basophil #: 0.1 10*3/uL (ref 0.0–0.1)
Basophil %: 0.8 %
Eosinophil %: 0.8 %
HGB: 11.3 g/dL — ABNORMAL LOW (ref 12.0–16.0)
Lymphocyte #: 1.2 10*3/uL (ref 1.0–3.6)
Lymphocyte %: 11.8 %
MCH: 33.1 pg (ref 26.0–34.0)
MCV: 93 fL (ref 80–100)
Monocyte #: 0.5 x10 3/mm (ref 0.2–0.9)
Monocyte %: 5.4 %
Neutrophil %: 81.2 %
Platelet: 181 10*3/uL (ref 150–440)
RBC: 3.43 10*6/uL — ABNORMAL LOW (ref 3.80–5.20)
WBC: 9.8 10*3/uL (ref 3.6–11.0)

## 2011-12-10 LAB — BASIC METABOLIC PANEL
Anion Gap: 9 (ref 7–16)
BUN: 36 mg/dL — ABNORMAL HIGH (ref 7–18)
Calcium, Total: 8.2 mg/dL — ABNORMAL LOW (ref 8.5–10.1)
Co2: 32 mmol/L (ref 21–32)
EGFR (African American): 37 — ABNORMAL LOW
EGFR (Non-African Amer.): 32 — ABNORMAL LOW
Glucose: 92 mg/dL (ref 65–99)
Osmolality: 284 (ref 275–301)

## 2011-12-10 LAB — PRO B NATRIURETIC PEPTIDE: B-Type Natriuretic Peptide: 7500 pg/mL — ABNORMAL HIGH (ref 0–450)

## 2011-12-10 LAB — PROTIME-INR
INR: 2.2
Prothrombin Time: 24.7 secs — ABNORMAL HIGH (ref 11.5–14.7)

## 2011-12-10 LAB — URINE CULTURE

## 2011-12-11 DIAGNOSIS — I5033 Acute on chronic diastolic (congestive) heart failure: Secondary | ICD-10-CM

## 2011-12-11 LAB — PROTIME-INR
INR: 2.2
Prothrombin Time: 24.3 secs — ABNORMAL HIGH (ref 11.5–14.7)

## 2011-12-11 LAB — BASIC METABOLIC PANEL
Calcium, Total: 8.7 mg/dL (ref 8.5–10.1)
Chloride: 101 mmol/L (ref 98–107)
Creatinine: 1.49 mg/dL — ABNORMAL HIGH (ref 0.60–1.30)
EGFR (Non-African Amer.): 32 — ABNORMAL LOW
Glucose: 92 mg/dL (ref 65–99)
Osmolality: 287 (ref 275–301)
Potassium: 3 mmol/L — ABNORMAL LOW (ref 3.5–5.1)
Sodium: 140 mmol/L (ref 136–145)

## 2011-12-12 ENCOUNTER — Telehealth: Payer: Self-pay | Admitting: Internal Medicine

## 2011-12-12 ENCOUNTER — Telehealth: Payer: Self-pay

## 2011-12-12 ENCOUNTER — Ambulatory Visit: Payer: Medicare Other | Admitting: Internal Medicine

## 2011-12-12 LAB — BASIC METABOLIC PANEL
Anion Gap: 9 (ref 7–16)
Calcium, Total: 8.5 mg/dL (ref 8.5–10.1)
EGFR (Non-African Amer.): 32 — ABNORMAL LOW
Glucose: 93 mg/dL (ref 65–99)
Osmolality: 288 (ref 275–301)
Potassium: 3.6 mmol/L (ref 3.5–5.1)
Sodium: 141 mmol/L (ref 136–145)

## 2011-12-12 LAB — PROTIME-INR: Prothrombin Time: 25.6 secs — ABNORMAL HIGH (ref 11.5–14.7)

## 2011-12-12 NOTE — Telephone Encounter (Signed)
Hospital follow up 12/22/11.  Pt discharged [facility] 12/12/11

## 2011-12-12 NOTE — Telephone Encounter (Signed)
Message copied by Marcelle Overlie on Fri Dec 12, 2011 10:01 AM ------      Message from: Oneida Arenas      Created: Fri Dec 12, 2011  8:53 AM      Regarding: tcm patient       tcm patient

## 2011-12-12 NOTE — Telephone Encounter (Signed)
LMTCB TCM #1

## 2011-12-12 NOTE — Telephone Encounter (Signed)
TCM call

## 2011-12-13 LAB — CULTURE, BLOOD (SINGLE)

## 2011-12-14 ENCOUNTER — Other Ambulatory Visit: Payer: Self-pay | Admitting: Internal Medicine

## 2011-12-15 NOTE — Telephone Encounter (Signed)
TCM call Pt says she is feeling well post discharge She was admitted for acute resp failure/CHF exacerbation Home health nurse is in her home now assessing pt Nurse wanted to confirm amlodipine dose of 5 mg daily I confirmed, per discharge instructions, amlodipine 5 mg tablets is correct  Pt also confirms appt with Dr.Arida 12/23/11. She wants to know if she can come in to see Dr,. Gollan same day as husband on 12/19/11. She states, "I prefer to see Dr. Mariah Milling. I do not know who Dr. Kirke Corin is." Appt moved to 12/19/11 at 3 pm same day as husband appt with Dr. Mariah Milling Home healthy nurse confirms this.

## 2011-12-17 ENCOUNTER — Ambulatory Visit (INDEPENDENT_AMBULATORY_CARE_PROVIDER_SITE_OTHER): Payer: Medicare Other | Admitting: Cardiovascular Disease

## 2011-12-17 DIAGNOSIS — I4891 Unspecified atrial fibrillation: Secondary | ICD-10-CM

## 2011-12-17 LAB — POCT INR: INR: 4

## 2011-12-19 ENCOUNTER — Encounter: Payer: Self-pay | Admitting: Cardiovascular Disease

## 2011-12-19 ENCOUNTER — Ambulatory Visit (INDEPENDENT_AMBULATORY_CARE_PROVIDER_SITE_OTHER): Payer: Medicare Other | Admitting: Cardiovascular Disease

## 2011-12-19 VITALS — BP 130/50 | HR 64 | Ht 65.0 in | Wt 156.2 lb

## 2011-12-19 DIAGNOSIS — I359 Nonrheumatic aortic valve disorder, unspecified: Secondary | ICD-10-CM

## 2011-12-19 DIAGNOSIS — I350 Nonrheumatic aortic (valve) stenosis: Secondary | ICD-10-CM

## 2011-12-19 DIAGNOSIS — R0602 Shortness of breath: Secondary | ICD-10-CM

## 2011-12-19 DIAGNOSIS — I10 Essential (primary) hypertension: Secondary | ICD-10-CM

## 2011-12-19 DIAGNOSIS — N289 Disorder of kidney and ureter, unspecified: Secondary | ICD-10-CM

## 2011-12-19 DIAGNOSIS — E785 Hyperlipidemia, unspecified: Secondary | ICD-10-CM

## 2011-12-19 DIAGNOSIS — I4891 Unspecified atrial fibrillation: Secondary | ICD-10-CM

## 2011-12-19 NOTE — Assessment & Plan Note (Signed)
Baseline creatinine in the hospital 1.4-1.5. This did not get worse with diuresis. She may require periodic basic metabolic panels as now she is on Lasix with potassium.

## 2011-12-19 NOTE — Assessment & Plan Note (Signed)
Hydralazine was held while in the hospital as blood pressure was well controlled without this. I suspect her blood pressure has improved with aggressive diuresis.

## 2011-12-19 NOTE — Progress Notes (Signed)
Patient ID: Madeline Perez, female    DOB: 05-Jul-1923, 78 y.o.   MRN: [id number]  HPI Comments: Ms. Sixkiller is a 78 year old woman with history of paroxysmal atrial fibrillation, moderate aortic valve stenosis, chronic lower extremity edema, chronic renal insufficiency, Diastolic dysfunction, pulmonary hypertension who presents for routine followup.  Recent hospital admission for acute diastolic CHF. Echocardiogram showed normal LV function, moderately elevated right ventricular systolic pressures estimated at 50-60 mmHg, moderate aortic valve stenosis with mean gradient 31 mm of mercury, estimated aortic valve area 1.22 cm, moderate sized left pleural effusion, moderate TR  She had aggressive diuresis in the hospital with Lasix IV twice a day, transitioning to Lasix by mouth twice a day, Been discharged on Lasix 40 mg daily. She was on oxygen by nasal cannula while in the hospital and at discharge. Recent oximetry numbers by physical therapy show she is maintaining oxygenation in the high 90s on room air.   Baseline creatinine in the hospital even despite diuresis was 1.4-1.5. She did have periods of hypokalemia with aggressive diuresis She reports that she stopped her Lasix as an outpatient and this is likely the cause of her acute diastolic CHF episode Initial BNP was 8500, TSH 1.9, peak troponin 0.54    EKG shows normal sinus rhythm with rate of 64 beats per minute, left anterior fascicular block, intraventricular conduction delay, poor R-wave progression through the anterior precordial leads    Outpatient Encounter Prescriptions as of 12/19/2011  Medication Sig Dispense Refill  . allopurinol (ZYLOPRIM) 100 MG tablet TAKE 1 TABLET BY MOUTH EVERY DAY  30 tablet  3  . amLODipine (NORVASC) 10 MG tablet Take 5 mg by mouth daily.      . beta carotene w/minerals (OCUVITE) tablet Take 1 tablet by mouth daily.        . cholecalciferol (VITAMIN D) 1000 UNITS tablet Take 1,000 Units by mouth  daily.        . CRESTOR 10 MG tablet TAKE 1 TABLET (10 MG TOTAL) BY MOUTH DAILY.  30 tablet  6  . furosemide (LASIX) 40 MG tablet Take 40 mg by mouth daily.      . hydrocortisone valerate ointment (WEST-CORT) 0.2 %       . hydrOXYzine (ATARAX/VISTARIL) 50 MG tablet       . metoprolol succinate (TOPROL-XL) 50 MG 24 hr tablet TAKE 1 TABLET EVERY DAY  90 tablet  3  . potassium chloride SA (K-DUR,KLOR-CON) 20 MEQ tablet Take 20 mEq by mouth daily.      . pramipexole (MIRAPEX) 0.25 MG tablet TAKE 1 TABLET BY MOUTH AT BEDTIME.  90 tablet  1  . propafenone (RYTHMOL) 225 MG tablet TAKE 1 TABLET 3 TIMES A DAY  270 tablet  3  . triamcinolone ointment (KENALOG) 0.1 %       . warfarin (COUMADIN) 2 MG tablet TAKE 1 TABLET BY MOUTH AS DIRECTED  90 tablet  1  . allopurinol (ZYLOPRIM) 100 MG tablet         Review of Systems  Constitutional: Negative.   HENT: Negative.   Eyes: Negative.   Respiratory: Negative.   Cardiovascular: Positive for leg swelling.  Gastrointestinal: Negative.   Musculoskeletal: Negative.   Skin: Negative.   Neurological: Negative.   Hematological: Negative.   Psychiatric/Behavioral: Negative.   All other systems reviewed and are negative.    BP 130/50  Pulse 64  Ht 5\' 5"  (1.651 m)  Wt 156 lb 4 oz (70.875 kg)  BMI 26.00 kg/m2  Physical Exam  Nursing note and vitals reviewed. Constitutional: She is oriented to person, place, and time. She appears well-developed and well-nourished.  HENT:  Head: Normocephalic.  Nose: Nose normal.  Mouth/Throat: Oropharynx is clear and moist.  Eyes: Conjunctivae normal are normal. Pupils are equal, round, and reactive to light.  Neck: Normal range of motion. Neck supple. No JVD present.  Cardiovascular: Normal rate, regular rhythm, S1 normal, S2 normal and intact distal pulses.  Exam reveals no gallop and no friction rub.   Murmur heard.  Crescendo systolic murmur is present with a grade of 3/6  Pulmonary/Chest: Effort normal and  breath sounds normal. No respiratory distress. She has no wheezes. She has no rales. She exhibits no tenderness.  Abdominal: Soft. Bowel sounds are normal. She exhibits no distension. There is no tenderness.  Musculoskeletal: Normal range of motion. She exhibits no edema and no tenderness.  Lymphadenopathy:    She has no cervical adenopathy.  Neurological: She is alert and oriented to person, place, and time. Coordination normal.  Skin: Skin is warm and dry. No rash noted. No erythema.  Psychiatric: She has a normal mood and affect. Her behavior is normal. Judgment and thought content normal.         Assessment and Plan

## 2011-12-19 NOTE — Assessment & Plan Note (Signed)
Shortness of breath significantly improved with diuresis. Now off oxygen. Clinical exam suggests pleural effusion has essentially resolved, or at least significantly improved.

## 2011-12-19 NOTE — Assessment & Plan Note (Signed)
No recent episodes of atrial fibrillation. Continue current medications. 

## 2011-12-19 NOTE — Assessment & Plan Note (Signed)
Moderate aortic valve stenosis. This is likely the cause of her recent acute diastolic CHF. She will likely need daily diuretics. Routine surveillance with echocardiogram next year.

## 2011-12-19 NOTE — Patient Instructions (Addendum)
You are doing well. OK to hold oxygen  Goal weight is 151 to 155 pounds If weight reaches 155 or more pounds, take an extra lasix at 2 pm (take morning lasix at 8 Am) Continue to monitor weight  Minimize fluid intake  Please call us if you have new issues that need to be addressed before your next appt.  Your physician wants you to follow-up in: 3 months.  You will receive a reminder letter in the mail two months in advance. If you don't receive a letter, please call our office to schedule the follow-up appointment.

## 2011-12-19 NOTE — Assessment & Plan Note (Signed)
Cholesterol is at goal on the current lipid regimen. No changes to the medications were made.  

## 2011-12-22 ENCOUNTER — Ambulatory Visit (INDEPENDENT_AMBULATORY_CARE_PROVIDER_SITE_OTHER): Payer: Medicare Other | Admitting: Internal Medicine

## 2011-12-22 ENCOUNTER — Encounter: Payer: Self-pay | Admitting: Internal Medicine

## 2011-12-22 VITALS — BP 138/74 | HR 62 | Temp 98.1°F | Ht 64.5 in | Wt 154.8 lb

## 2011-12-22 DIAGNOSIS — I4891 Unspecified atrial fibrillation: Secondary | ICD-10-CM

## 2011-12-22 DIAGNOSIS — E785 Hyperlipidemia, unspecified: Secondary | ICD-10-CM

## 2011-12-22 DIAGNOSIS — N289 Disorder of kidney and ureter, unspecified: Secondary | ICD-10-CM

## 2011-12-22 MED ORDER — ZOSTER VACCINE LIVE 19400 UNT/0.65ML ~~LOC~~ SOLR: 0.6500 mL | Freq: Once | SUBCUTANEOUS | Status: DC

## 2011-12-22 NOTE — Patient Instructions (Addendum)
Please  Have your fasting lipids and renal function checked by the RN At [facility] if she will draw your blood.  If not,  You can make an appt for an early morning lab draw here at the office.   Drink when you are thirsty;  But stop when you are not  Limit your sodium intake to 2 grams (2000 mg ) daily if possible

## 2011-12-22 NOTE — Progress Notes (Signed)
Patient ID: Madeline Perez, female   DOB: 03/28/1923, 78 y.o.   MRN: [id number]  Patient Active Problem List  Diagnosis  . HYPERLIPIDEMIA-MIXED  . HYPERTENSION, BENIGN  . ATRIAL FIBRILLATION  . EDEMA  . DYSPNEA  . CHEST PAIN-UNSPECIFIED  . Palpitations  . Xerosis of skin  . Renal insufficiency  . Pain of toe of right foot  . Vertigo  . Leg pain, medial  . Aortic valve stenosis  . Anxiety  . Pes planus (flat feet)    Subjective:  CC:   Chief Complaint  Patient presents with  . Follow-up    HPI:   Madeline Perez a 78 y.o. female who presents Discharged from [facility] recently with CHF secondary to atrial fibrillation.  Was diuresed with  IV lasix and treated for asymptomatic UTI with Rocephin, then home on Ceftin and supplemental 02 which was suspended after hospital follow up with [facility].  Needs renal function and lipids checked. Getting Home Health  For PT and is intermittently complying with home exercises. Was told to  Limit water intake but remains thirsty all the time.  Weight has been 151 to 153  For the past 10 days .  Is aware that she needs to avoid salt  .  Discussed sodium free soups, use of Mrs. Dash,  and lunch meat.  Eating more fruit, and yogurt.   Gave up pretzels and cheese puffs..  Not short of breath. Husband is seeing Dr., Alphonsus Sias and she is wondering if she can establish relationship with him since he goes to [facility].      Past Medical History  Diagnosis Date  . Hypertension   . Hyperlipidemia   . Aortic stenosis     mild to moderate with estimated valve area of 1.2 cm in 2007  . Macular degeneration   . Chronic renal insufficiency   . CHF (congestive heart failure)   . Atrial fibrillation     Past Surgical History  Procedure Date  . Cholecystectomy 03/28/1998         The following portions of the patient's history were reviewed and updated as appropriate: Allergies, current medications, and problem list.    Review of Systems:   12 Pt   review of systems was negative except those addressed in the HPI,     History   Social History  . Marital Status: Married    Spouse Name: N/A    Number of Children: N/A  . Years of Education: N/A   Occupational History  . Not on file.   Social History Main Topics  . Smoking status: Never Smoker   . Smokeless tobacco: Never Used  . Alcohol Use: No  . Drug Use: No  . Sexually Active: Not on file   Other Topics Concern  . Not on file   Social History Narrative  . No narrative on file    Objective:  BP 138/74  Pulse 62  Temp 98.1 F (36.7 C) (Oral)  Ht 5' 4.5" (1.638 m)  Wt 154 lb 12 oz (70.194 kg)  BMI 26.15 kg/m2  SpO2 96%  General appearance: alert, cooperative and appears stated age Ears: normal TM's and external ear canals both ears Throat: lips, mucosa, and tongue normal; teeth and gums normal Neck: no adenopathy, no carotid bruit, supple, symmetrical, trachea midline and thyroid not enlarged, symmetric, no tenderness/mass/nodules Back: symmetric, no curvature. ROM normal. No CVA tenderness. Lungs: clear to auscultation bilaterally Heart: regular rate and rhythm, S1, S2 normal, no murmur, click,  rub or gallop Abdomen: soft, non-tender; bowel sounds normal; no masses,  no organomegaly Pulses: 2+ and symmetric Skin: Skin color, texture, turgor normal. No rashes or lesions Lymph nodes: Cervical, supraclavicular, and axillary nodes normal.  Assessment and Plan:  ATRIAL FIBRILLATION In early rate control with no signs of volume overload. No changes to regimen today.  Renal insufficiency She has been referred to nephrology in the past. Renal function was declining during hospitalization due to overdiuresis. Repeat  HYPERLIPIDEMIA-MIXED LDL was 71 in April on current therapy. Repeat is due.   Updated Medication List Outpatient Encounter Prescriptions as of 12/22/2011  Medication Sig Dispense Refill  . allopurinol (ZYLOPRIM) 100 MG tablet       .  allopurinol (ZYLOPRIM) 100 MG tablet TAKE 1 TABLET BY MOUTH EVERY DAY  30 tablet  3  . amLODipine (NORVASC) 10 MG tablet Take 5 mg by mouth daily.      . beta carotene w/minerals (OCUVITE) tablet Take 1 tablet by mouth daily.        . cholecalciferol (VITAMIN D) 1000 UNITS tablet Take 1,000 Units by mouth daily.        . CRESTOR 10 MG tablet TAKE 1 TABLET (10 MG TOTAL) BY MOUTH DAILY.  30 tablet  6  . furosemide (LASIX) 40 MG tablet Take 40 mg by mouth daily.      . hydrocortisone valerate ointment (WEST-CORT) 0.2 %       . hydrOXYzine (ATARAX/VISTARIL) 50 MG tablet       . metoprolol succinate (TOPROL-XL) 50 MG 24 hr tablet TAKE 1 TABLET EVERY DAY  90 tablet  3  . potassium chloride SA (K-DUR,KLOR-CON) 20 MEQ tablet Take 20 mEq by mouth daily.      . pramipexole (MIRAPEX) 0.25 MG tablet TAKE 1 TABLET BY MOUTH AT BEDTIME.  90 tablet  1  . propafenone (RYTHMOL) 225 MG tablet TAKE 1 TABLET 3 TIMES A DAY  270 tablet  3  . triamcinolone ointment (KENALOG) 0.1 %       . warfarin (COUMADIN) 2 MG tablet TAKE 1 TABLET BY MOUTH AS DIRECTED  90 tablet  1  . hydrALAZINE (APRESOLINE) 25 MG tablet       . zoster vaccine live, PF, (ZOSTAVAX) 16109 UNT/0.65ML injection Inject 19,400 Units into the skin once.  1 vial  0     No orders of the defined types were placed in this encounter.    Return in about 6 months (around 06/21/2012).

## 2011-12-22 NOTE — Assessment & Plan Note (Signed)
LDL was 71 in April on current therapy. Repeat is due.

## 2011-12-22 NOTE — Assessment & Plan Note (Signed)
In early rate control with no signs of volume overload. No changes to regimen today.

## 2011-12-22 NOTE — Assessment & Plan Note (Signed)
She has been referred to nephrology in the past. Renal function was declining during hospitalization due to overdiuresis. Repeat

## 2011-12-23 ENCOUNTER — Encounter: Payer: Medicare Other | Admitting: Cardiovascular Disease

## 2011-12-24 ENCOUNTER — Ambulatory Visit (INDEPENDENT_AMBULATORY_CARE_PROVIDER_SITE_OTHER): Payer: Medicare Other | Admitting: Cardiovascular Disease

## 2011-12-24 DIAGNOSIS — I4891 Unspecified atrial fibrillation: Secondary | ICD-10-CM

## 2011-12-24 LAB — POCT INR: INR: 2.9

## 2011-12-30 ENCOUNTER — Telehealth: Payer: Self-pay | Admitting: Internal Medicine

## 2011-12-30 NOTE — Telephone Encounter (Signed)
Madeline Perez called  Pt had sip of oj this morning and they were to do met b and lipids labs today. Next time they will be out will be 11/11

## 2011-12-30 NOTE — Telephone Encounter (Signed)
Spoke to Jackson told her to have patient do the labs 01/05/12.

## 2011-12-31 ENCOUNTER — Other Ambulatory Visit: Payer: Self-pay | Admitting: Internal Medicine

## 2011-12-31 ENCOUNTER — Telehealth: Payer: Self-pay | Admitting: Internal Medicine

## 2011-12-31 ENCOUNTER — Encounter: Payer: Self-pay | Admitting: Cardiovascular Disease

## 2011-12-31 NOTE — Telephone Encounter (Signed)
Got a message from [facility] that patient had stopped her potassium chloride, because she was told to at discharge.  She is incorrect.  Her dc instructions include daily potassium chloride 20 meq.  I have reviewed her chat and find no messages to stop it. Please make sure she resumes it.

## 2011-12-31 NOTE — Telephone Encounter (Signed)
Left message on patient vm to call me back regarding medication. 

## 2012-01-05 ENCOUNTER — Telehealth: Payer: Self-pay | Admitting: Cardiovascular Disease

## 2012-01-05 NOTE — Telephone Encounter (Signed)
See below

## 2012-01-05 NOTE — Telephone Encounter (Signed)
Rep would like to report PT INR results.  Please call back.

## 2012-01-06 ENCOUNTER — Ambulatory Visit (INDEPENDENT_AMBULATORY_CARE_PROVIDER_SITE_OTHER): Payer: Medicare Other

## 2012-01-06 DIAGNOSIS — I4891 Unspecified atrial fibrillation: Secondary | ICD-10-CM

## 2012-01-06 NOTE — Telephone Encounter (Signed)
error 

## 2012-01-06 NOTE — Telephone Encounter (Signed)
Spoke to patient and she is clear on continuing taking her potassium medication.

## 2012-01-07 ENCOUNTER — Telehealth: Payer: Self-pay | Admitting: Internal Medicine

## 2012-01-07 NOTE — Telephone Encounter (Signed)
And I have reviewed her recent labs she had drawn by the health nurse. Her cholesterol is at goal. Function is stable. Electrolytes are fine. However her blood sugar was low at 64. This was apparently drawn at 9:45 in the morning. Please ask the home health nurse to check a fingerstick blood sugar the next time she is the out there

## 2012-01-08 NOTE — Telephone Encounter (Signed)
Patient notified of lab results advised patient to have home nurse to do a finger stick to check her blood.

## 2012-01-09 ENCOUNTER — Other Ambulatory Visit: Payer: Self-pay | Admitting: Cardiovascular Disease

## 2012-01-09 NOTE — Telephone Encounter (Signed)
Refill done as requested 

## 2012-01-09 NOTE — Telephone Encounter (Signed)
Please review and refill, Thank You. 

## 2012-01-19 ENCOUNTER — Telehealth: Payer: Self-pay | Admitting: Cardiovascular Disease

## 2012-01-19 ENCOUNTER — Ambulatory Visit: Payer: Self-pay | Admitting: Cardiovascular Disease

## 2012-01-19 DIAGNOSIS — I4891 Unspecified atrial fibrillation: Secondary | ICD-10-CM

## 2012-01-19 LAB — POCT INR: INR: 3

## 2012-01-19 NOTE — Telephone Encounter (Signed)
[facility] calling with INR results. Please call [phone]  36.1 PT  3.0 INR

## 2012-01-19 NOTE — Telephone Encounter (Signed)
Results noted see anticoagulation note in EPIC.

## 2012-01-20 ENCOUNTER — Encounter: Payer: Self-pay | Admitting: Internal Medicine

## 2012-01-20 ENCOUNTER — Ambulatory Visit (INDEPENDENT_AMBULATORY_CARE_PROVIDER_SITE_OTHER): Payer: Medicare Other | Admitting: Internal Medicine

## 2012-01-20 VITALS — BP 126/62 | HR 64 | Temp 98.3°F | Ht 64.5 in | Wt 155.2 lb

## 2012-01-20 DIAGNOSIS — I350 Nonrheumatic aortic (valve) stenosis: Secondary | ICD-10-CM

## 2012-01-20 DIAGNOSIS — F411 Generalized anxiety disorder: Secondary | ICD-10-CM

## 2012-01-20 DIAGNOSIS — Z7189 Other specified counseling: Secondary | ICD-10-CM

## 2012-01-20 DIAGNOSIS — F419 Anxiety disorder, unspecified: Secondary | ICD-10-CM

## 2012-01-20 DIAGNOSIS — I359 Nonrheumatic aortic valve disorder, unspecified: Secondary | ICD-10-CM

## 2012-01-20 NOTE — Progress Notes (Signed)
Patient ID: Madeline Perez, female   DOB: 01/19/1924, 78 y.o.   MRN: [id number]  Patient Active Problem List  Diagnosis  . HYPERLIPIDEMIA-MIXED  . HYPERTENSION, BENIGN  . ATRIAL FIBRILLATION  . EDEMA  . DYSPNEA  . Renal insufficiency  . Pain of toe of right foot  . Vertigo  . Aortic valve stenosis  . Anxiety  . Pes planus (flat feet)  . Encounter for discussion of do not resuscitate order    Subjective:  CC:   Chief Complaint  Patient presents with  . Advice Only    HPI:   Madeline Perez a 78 y.o. female who presents to discuss goals of care and end-of-life treatment options. She was given a DO NOT RESUSCITATE/MOST form recently by Hospice caregiver who is caring for her 40 year old husband who has dementia. Her husband has opted for DO NOT RESUSCITATE status. She had several questions about the options that are available on the form.    She states that she has a good quality of life, does not have pain on a daily basis. She does not feel that she is depressed but she is quite anxious about her husband's long-term prognosis given that he is physically very healthy. She's afraid that she will not outlive him and worries about what will happen to him if she dies first. She is having some caregiver fatigue due to his frequent repetitive questions about his mental status. She has frequent social interactions with her sons and daughter. She does not want to be kept alive indefinitely and is under the misconception that this desire is synonymous with wanting to avoid resuscitation should she suffer a cardio pulmonary arrest. She does want EMS to try to revive her if she had an out-of-hospital arrest.    Past Medical History  Diagnosis Date  . Hypertension   . Hyperlipidemia   . Aortic stenosis     mild to moderate with estimated valve area of 1.2 cm in 2007  . Macular degeneration   . Chronic renal insufficiency   . CHF (congestive heart failure)   . Atrial fibrillation      Past Surgical History  Procedure Date  . Cholecystectomy 03/28/1998    The following portions of the patient's history were reviewed and updated as appropriate: Allergies, current medications, and problem list.    Review of Systems:  Patient denies headache, fevers, malaise, unintentional weight loss, skin rash, eye pain, sinus congestion and sinus pain, sore throat, dysphagia,  hemoptysis , cough, dyspnea, wheezing, chest pain, palpitations, orthopnea, abdominal pain, nausea, melena, diarrhea, constipation, flank pain, dysuria, hematuria, urinary  Frequency, nocturia, numbness, tingling, seizures,  Focal weakness, Loss of consciousness,  Tremor, insomnia, depression, and suicidal ideation.       History   Social History  . Marital Status: Married    Spouse Name: N/A    Number of Children: N/A  . Years of Education: N/A   Occupational History  . Not on file.   Social History Main Topics  . Smoking status: Never Smoker   . Smokeless tobacco: Never Used  . Alcohol Use: No  . Drug Use: No  . Sexually Active: Not on file   Other Topics Concern  . Not on file   Social History Narrative  . No narrative on file    Objective:  BP 126/62  Pulse 64  Temp 98.3 F (36.8 C) (Oral)  Ht 5' 4.5" (1.638 m)  Wt 155 lb 4 oz (70.421 kg)  BMI 26.24 kg/m2  SpO2 98%  General appearance: alert, cooperative and appears stated age Ears: normal TM's and external ear canals both ears Throat: lips, mucosa, and tongue normal; teeth and gums normal Neck: no adenopathy, no carotid bruit, supple, symmetrical, trachea midline and thyroid not enlarged, symmetric, no tenderness/mass/nodules Back: symmetric, no curvature. ROM normal. No CVA tenderness. Lungs: clear to auscultation bilaterally Heart: regular rate and rhythm, S1, S2 normal, no murmur, click, rub or gallop Abdomen: soft, non-tender; bowel sounds normal; no masses,  no organomegaly Pulses: 2+ and symmetric Skin: Skin color,  texture, turgor normal. No rashes or lesions Lymph nodes: Cervical, supraclavicular, and axillary nodes normal.  Assessment and Plan:  Anxiety Secondary to recent diagnosis of congestive heart failure during hospital admission. He is not having panic attacks or insomnia. No medication is necessary.  Aortic valve stenosis She has considered aortic valve replacement and Dr Mariah Milling is reassured her that patients her age can survive this procedure. She does not want to consider it at this time.  Encounter for discussion of do not resuscitate order We spent 40 minutes today discussing the ramifications of signing a DO NOT RESUSCITATE order.. Given her quality of life being quite good currently and her concerns about dying for her husband, she does not want to sign that the DO NOT RESUSCITATE order. She does however have a living will and states that she would not want to be kept alive indefinitely should there be no sign of her regaining consciousness or the ability to maintain adequate ventilation without mechanical support.   Updated Medication List Outpatient Encounter Prescriptions as of 01/20/2012  Medication Sig Dispense Refill  . allopurinol (ZYLOPRIM) 100 MG tablet       . allopurinol (ZYLOPRIM) 100 MG tablet TAKE 1 TABLET BY MOUTH EVERY DAY  30 tablet  3  . amLODipine (NORVASC) 10 MG tablet Take 5 mg by mouth daily.      . beta carotene w/minerals (OCUVITE) tablet Take 1 tablet by mouth daily.        . cholecalciferol (VITAMIN D) 1000 UNITS tablet Take 1,000 Units by mouth daily.        . CRESTOR 10 MG tablet TAKE 1 TABLET (10 MG TOTAL) BY MOUTH DAILY.  30 tablet  6  . furosemide (LASIX) 40 MG tablet Take 40 mg by mouth daily.      . hydrALAZINE (APRESOLINE) 25 MG tablet       . hydrocortisone valerate ointment (WEST-CORT) 0.2 %       . hydrOXYzine (ATARAX/VISTARIL) 50 MG tablet       . metoprolol succinate (TOPROL-XL) 50 MG 24 hr tablet TAKE 1 TABLET EVERY DAY  90 tablet  3  .  potassium chloride SA (K-DUR,KLOR-CON) 20 MEQ tablet Take 20 mEq by mouth daily.      . pramipexole (MIRAPEX) 0.25 MG tablet TAKE 1 TABLET BY MOUTH AT BEDTIME.  90 tablet  1  . propafenone (RYTHMOL) 225 MG tablet TAKE 1 TABLET 3 TIMES A DAY  270 tablet  3  . triamcinolone ointment (KENALOG) 0.1 %       . warfarin (COUMADIN) 2 MG tablet TAKE 1 TABLET BY MOUTH AS DIRECTED  90 tablet  1  . zoster vaccine live, PF, (ZOSTAVAX) 16109 UNT/0.65ML injection Inject 19,400 Units into the skin once.  1 vial  0     No orders of the defined types were placed in this encounter.    No Follow-up on file.

## 2012-01-22 DIAGNOSIS — Z7189 Other specified counseling: Secondary | ICD-10-CM | POA: Insufficient documentation

## 2012-01-22 NOTE — Assessment & Plan Note (Signed)
We spent 40 minutes today discussing the ramifications of signing a DO NOT RESUSCITATE order.. Given her quality of life being quite good currently and her concerns about dying for her husband, she does not want to sign that the DO NOT RESUSCITATE order. She does however have a living will and states that she would not want to be kept alive indefinitely should there be no sign of her regaining consciousness or the ability to maintain adequate ventilation without mechanical support.

## 2012-01-22 NOTE — Assessment & Plan Note (Addendum)
Secondary to recent diagnosis of congestive heart failure during hospital admission. He is not having panic attacks or insomnia. No medication is necessary.

## 2012-01-22 NOTE — Assessment & Plan Note (Signed)
She has considered aortic valve replacement and Dr Mariah Milling is reassured her that patients her age can survive this procedure. She does not want to consider it at this time.

## 2012-01-26 ENCOUNTER — Ambulatory Visit: Payer: Medicare Other | Admitting: Internal Medicine

## 2012-01-28 ENCOUNTER — Telehealth: Payer: Self-pay

## 2012-01-28 NOTE — Telephone Encounter (Signed)
Coumadin level is therapeutic,  Continue current regimen and repeat PT/INR in one month 

## 2012-01-28 NOTE — Telephone Encounter (Signed)
Home health aide reported patient INR 2.4 and PT 28.9

## 2012-01-29 NOTE — Telephone Encounter (Signed)
PATIENT NOTIFIED AS DIRECTED.

## 2012-02-26 ENCOUNTER — Telehealth: Payer: Self-pay | Admitting: Cardiovascular Disease

## 2012-02-26 ENCOUNTER — Ambulatory Visit (INDEPENDENT_AMBULATORY_CARE_PROVIDER_SITE_OTHER): Payer: Medicare Other

## 2012-02-26 DIAGNOSIS — I4891 Unspecified atrial fibrillation: Secondary | ICD-10-CM

## 2012-02-26 LAB — POCT INR: INR: 2.3

## 2012-02-26 NOTE — Telephone Encounter (Signed)
Pt says that [facility] says that pt needs O2 at night due to a test that they done for her. Pt states that she checks it in the night when she wakes up and its fine but they told that there readins showed that it went down. I explained to pt that it may be going down when she is asleep and relaxed. Pt not sure who originally ordered the O2

## 2012-02-26 NOTE — Telephone Encounter (Signed)
#   provided is # for [facility] Trying cell # listed for pt

## 2012-02-26 NOTE — Telephone Encounter (Signed)
lmtcb on cell#

## 2012-02-27 ENCOUNTER — Telehealth: Payer: Self-pay

## 2012-02-27 NOTE — Telephone Encounter (Signed)
Betsy called back from [facility] in response to pts question re: nighttime oxygen See telephone note from today 1/3 Says pt worse overnight oxymetry 12/27/11 and pt desaturated to 63% x 3 hours Says this is why she needs to wear O2 at HS I will call pt to explain

## 2012-02-27 NOTE — Telephone Encounter (Signed)
Received message from [facility] with [facility] to call her back re:pt at [phone] [facility]

## 2012-02-27 NOTE — Telephone Encounter (Signed)
Any way you can help me with this info?  Let me know if there is another clinical person I should be contacting. Thanks!

## 2012-02-27 NOTE — Telephone Encounter (Signed)
Says when she was with [facility] she was given a monitor to check her O2 sats at hs. Says she received t/c from therapist saying they received some "low #'s" from monitor and suggested she wear 2LO2 [place] at hs only. Pt is concerned b/c she monitors O2 sats herself and these are ranging from 94-96% on RA. I explained they may be getting readings while she is sleeping versus awake. she reassures me she doe snot wear monitor while sleeping so she is unsure how they could be getting low #s. i told her i would see if I could not get results from [facility] and call her back. Understanding verb.

## 2012-02-27 NOTE — Telephone Encounter (Signed)
Pt informed of reasoning for nighttime O2 Understanding verb She will remain compliant with this

## 2012-03-03 ENCOUNTER — Other Ambulatory Visit: Payer: Self-pay | Admitting: Internal Medicine

## 2012-03-03 NOTE — Telephone Encounter (Signed)
Med filled.  

## 2012-03-12 ENCOUNTER — Other Ambulatory Visit: Payer: Self-pay | Admitting: Internal Medicine

## 2012-03-18 ENCOUNTER — Other Ambulatory Visit: Payer: Self-pay | Admitting: Internal Medicine

## 2012-03-19 NOTE — Telephone Encounter (Signed)
Med filled.  

## 2012-03-24 ENCOUNTER — Ambulatory Visit: Payer: Medicare Other | Admitting: Cardiovascular Disease

## 2012-04-01 ENCOUNTER — Ambulatory Visit (INDEPENDENT_AMBULATORY_CARE_PROVIDER_SITE_OTHER): Payer: Medicare Other

## 2012-04-01 ENCOUNTER — Encounter: Payer: Self-pay | Admitting: Cardiovascular Disease

## 2012-04-01 ENCOUNTER — Ambulatory Visit (INDEPENDENT_AMBULATORY_CARE_PROVIDER_SITE_OTHER): Payer: Medicare Other | Admitting: Cardiovascular Disease

## 2012-04-01 VITALS — BP 102/50 | HR 60 | Ht 64.0 in | Wt 152.5 lb

## 2012-04-01 DIAGNOSIS — I350 Nonrheumatic aortic (valve) stenosis: Secondary | ICD-10-CM

## 2012-04-01 DIAGNOSIS — I4891 Unspecified atrial fibrillation: Secondary | ICD-10-CM

## 2012-04-01 DIAGNOSIS — M25519 Pain in unspecified shoulder: Secondary | ICD-10-CM

## 2012-04-01 DIAGNOSIS — I509 Heart failure, unspecified: Secondary | ICD-10-CM

## 2012-04-01 DIAGNOSIS — F411 Generalized anxiety disorder: Secondary | ICD-10-CM

## 2012-04-01 DIAGNOSIS — I359 Nonrheumatic aortic valve disorder, unspecified: Secondary | ICD-10-CM

## 2012-04-01 DIAGNOSIS — I10 Essential (primary) hypertension: Secondary | ICD-10-CM

## 2012-04-01 DIAGNOSIS — I5032 Chronic diastolic (congestive) heart failure: Secondary | ICD-10-CM

## 2012-04-01 DIAGNOSIS — R609 Edema, unspecified: Secondary | ICD-10-CM

## 2012-04-01 DIAGNOSIS — F419 Anxiety disorder, unspecified: Secondary | ICD-10-CM

## 2012-04-01 DIAGNOSIS — E785 Hyperlipidemia, unspecified: Secondary | ICD-10-CM

## 2012-04-01 LAB — POCT INR: INR: 2.7

## 2012-04-01 NOTE — Patient Instructions (Addendum)
You are doing well. No medication changes were made.  Please call us if you have new issues that need to be addressed before your next appt.  Your physician wants you to follow-up in: 6 months.  You will receive a reminder letter in the mail two months in advance. If you don't receive a letter, please call our office to schedule the follow-up appointment.   

## 2012-04-01 NOTE — Assessment & Plan Note (Signed)
Acute left greater than right posterior shoulder discomfort radiating into her neck, likely musculoskeletal. Less likely coronary disease and ischemia. We have recommended Tylenol, hard compress, if symptoms get worse, ibuprofen in moderation. If she has worsening symptoms, or asked her to call the office. Symptoms likely secondary to sleeping poorly or stress.

## 2012-04-01 NOTE — Assessment & Plan Note (Signed)
Edema is relatively stable. We have encouraged her to stay on her Lasix daily. No significant shortness of breath.

## 2012-04-01 NOTE — Assessment & Plan Note (Addendum)
much of today's visit talking about her husband and her role as the caregiver. Her job is getting harder and harder as her husband is doing less and less. Husband is on hospice and I have encouraged her to use the hospice resources for more help for him at home and to introduce additional helpers into the house.

## 2012-04-01 NOTE — Assessment & Plan Note (Signed)
Cholesterol is at goal on the current lipid regimen. No changes to the medications were made.  

## 2012-04-01 NOTE — Assessment & Plan Note (Signed)
We have encouraged her to stay on her Lasix daily. Aortic valve stenosis and diastolic dysfunction contributing to chronic fluid retention.

## 2012-04-01 NOTE — Assessment & Plan Note (Signed)
Blood pressure is well controlled on today's visit. No changes made to the medications. 

## 2012-04-01 NOTE — Progress Notes (Signed)
Patient ID: Madeline Perez, female    DOB: [birth year], 79 y.o.   MRN: [id number]  HPI Comments: Ms. Gethers is a 79 year old woman with history of paroxysmal atrial fibrillation, moderate aortic valve stenosis, chronic lower extremity edema, chronic renal insufficiency, Diastolic dysfunction, pulmonary hypertension who presents for routine followup.   hospital admission for acute diastolic CHF in 11/2011 after she stopped taking her diuretic.  Initial BNP was 8500, TSH 1.9, peak troponin 0.54  Echocardiogram showed normal LV function, moderately elevated right ventricular systolic pressures estimated at 50-60 mmHg, moderate aortic valve stenosis with mean gradient 31 mm of mercury, estimated aortic valve area 1.22 cm, moderate sized left pleural effusion, moderate TR She had aggressive diuresis in the hospital with Lasix IV twice a day, transitioning to Lasix by mouth twice a day, discharged on Lasix 40 mg daily. She was on oxygen by nasal cannula while in the hospital and at discharge.   She does not use oxygen at nighttime and would like to send the oxygen concentrator back. She has significant stress at home as her husband has worsening dementia and COPD. She is doing more for him as he is becoming less able to take care of himself. She feels very tired, today he has pain in her posterior shoulders of the left, radiating to the right, also behind the neck. She wonders if it could be her heart as she woke up with this today.    Baseline creatinine in the hospital even despite diuresis was 1.4-1.5. She did have periods of hypokalemia with aggressive diuresis   EKG shows normal sinus rhythm with rate of 60 beats per minute, left anterior fascicular block, intraventricular conduction delay, poor R-wave progression through the anterior precordial leads    Outpatient Encounter Prescriptions as of 04/01/2012  Medication Sig Dispense Refill  . allopurinol (ZYLOPRIM) 100 MG tablet TAKE 1 TABLET BY  MOUTH EVERY DAY  30 tablet  3  . amLODipine (NORVASC) 10 MG tablet Take 5 mg by mouth daily.      . beta carotene w/minerals (OCUVITE) tablet Take 1 tablet by mouth daily.        . CRESTOR 10 MG tablet TAKE 1 TABLET (10 MG TOTAL) BY MOUTH DAILY.  30 tablet  6  . furosemide (LASIX) 40 MG tablet Take 40 mg by mouth daily.      Marland Kitchen KLOR-CON M20 20 MEQ tablet TAKE 1 TABLET BY MOUTH ONCE A DAY  30 tablet  1  . metoprolol succinate (TOPROL-XL) 50 MG 24 hr tablet TAKE 1 TABLET EVERY DAY  90 tablet  3  . pramipexole (MIRAPEX) 0.25 MG tablet TAKE 1 TABLET BY MOUTH AT BEDTIME.  90 tablet  1  . propafenone (RYTHMOL) 225 MG tablet TAKE 1 TABLET 3 TIMES A DAY  270 tablet  3  . triamcinolone ointment (KENALOG) 0.1 %       . warfarin (COUMADIN) 2 MG tablet TAKE 1 TABLET BY MOUTH AS DIRECTED  90 tablet  1     Review of Systems  Constitutional: Negative.   HENT: Negative.   Eyes: Negative.   Respiratory: Negative.   Gastrointestinal: Negative.   Musculoskeletal: Negative.        Posterior shoulder and neck pain bilaterally, worse on the left  Skin: Negative.   Neurological: Negative.   Hematological: Negative.   Psychiatric/Behavioral: Negative.   All other systems reviewed and are negative.    BP 102/50  Pulse 60  Ht 5\' 4"  (1.626 m)  Wt  152 lb 8 oz (69.174 kg)  BMI 26.18 kg/m2  Physical Exam  Nursing note and vitals reviewed. Constitutional: She is oriented to person, place, and time. She appears well-developed and well-nourished.  HENT:  Head: Normocephalic.  Nose: Nose normal.  Mouth/Throat: Oropharynx is clear and moist.  Eyes: Conjunctivae normal are normal. Pupils are equal, round, and reactive to light.  Neck: Normal range of motion. Neck supple. No JVD present.  Cardiovascular: Normal rate, regular rhythm, S1 normal, S2 normal and intact distal pulses.  Exam reveals no gallop and no friction rub.   Murmur heard.  Crescendo systolic murmur is present with a grade of 3/6   Pulmonary/Chest: Effort normal and breath sounds normal. No respiratory distress. She has no wheezes. She has no rales. She exhibits no tenderness.  Abdominal: Soft. Bowel sounds are normal. She exhibits no distension. There is no tenderness.  Musculoskeletal: Normal range of motion. She exhibits no edema and no tenderness.  Lymphadenopathy:    She has no cervical adenopathy.  Neurological: She is alert and oriented to person, place, and time. Coordination normal.  Skin: Skin is warm and dry. No rash noted. No erythema.  Psychiatric: She has a normal mood and affect. Her behavior is normal. Judgment and thought content normal.         Assessment and Plan

## 2012-04-01 NOTE — Assessment & Plan Note (Signed)
Maintaining normal sinus rhyth

## 2012-04-01 NOTE — Assessment & Plan Note (Signed)
Moderate aortic valve stenosis. Given her relatively sedentary lifestyle, this will likely not contributing to symptoms for some time.

## 2012-04-02 ENCOUNTER — Other Ambulatory Visit: Payer: Self-pay

## 2012-04-02 DIAGNOSIS — Z9981 Dependence on supplemental oxygen: Secondary | ICD-10-CM

## 2012-04-14 ENCOUNTER — Other Ambulatory Visit: Payer: Self-pay | Admitting: *Deleted

## 2012-04-14 MED ORDER — FUROSEMIDE 40 MG PO TABS: 40.0000 mg | ORAL_TABLET | Freq: Every day | ORAL | Status: DC

## 2012-04-14 NOTE — Telephone Encounter (Signed)
Refill please send to [facility]

## 2012-04-14 NOTE — Telephone Encounter (Signed)
[facility] 

## 2012-04-28 ENCOUNTER — Ambulatory Visit (INDEPENDENT_AMBULATORY_CARE_PROVIDER_SITE_OTHER): Payer: Medicare Other

## 2012-04-28 DIAGNOSIS — I4891 Unspecified atrial fibrillation: Secondary | ICD-10-CM

## 2012-04-28 LAB — POCT INR: INR: 2.4

## 2012-06-02 ENCOUNTER — Ambulatory Visit (INDEPENDENT_AMBULATORY_CARE_PROVIDER_SITE_OTHER): Payer: Medicare Other

## 2012-06-02 DIAGNOSIS — I4891 Unspecified atrial fibrillation: Secondary | ICD-10-CM

## 2012-06-02 LAB — POCT INR: INR: 2

## 2012-06-17 ENCOUNTER — Other Ambulatory Visit: Payer: Self-pay | Admitting: *Deleted

## 2012-06-18 ENCOUNTER — Telehealth: Payer: Self-pay | Admitting: Internal Medicine

## 2012-06-18 MED ORDER — AMLODIPINE BESYLATE 10 MG PO TABS: 5.0000 mg | ORAL_TABLET | Freq: Every day | ORAL | Status: DC

## 2012-06-18 NOTE — Telephone Encounter (Signed)
Patient notified script at pharmacy

## 2012-06-18 NOTE — Telephone Encounter (Signed)
Patient called stating the pharmacy had sent over refill request on Monday for her Norvasc, they have not heard back. Please advise. [facility] [address], patient has around [facility] left.

## 2012-06-18 NOTE — Telephone Encounter (Signed)
Rx sent to pharmacy by escript  

## 2012-06-21 ENCOUNTER — Encounter: Payer: Self-pay | Admitting: Internal Medicine

## 2012-06-21 ENCOUNTER — Ambulatory Visit (INDEPENDENT_AMBULATORY_CARE_PROVIDER_SITE_OTHER): Payer: Medicare Other | Admitting: Internal Medicine

## 2012-06-21 VITALS — BP 118/54 | HR 67 | Temp 98.2°F | Resp 14 | Wt 151.2 lb

## 2012-06-21 DIAGNOSIS — Z7189 Other specified counseling: Secondary | ICD-10-CM

## 2012-06-21 DIAGNOSIS — F411 Generalized anxiety disorder: Secondary | ICD-10-CM

## 2012-06-21 DIAGNOSIS — I10 Essential (primary) hypertension: Secondary | ICD-10-CM

## 2012-06-21 DIAGNOSIS — F419 Anxiety disorder, unspecified: Secondary | ICD-10-CM

## 2012-06-21 NOTE — Assessment & Plan Note (Addendum)
Largely due to husband's continued decline.  She is concerned that her husband will not acclimate or be able to get into the Health Center at [facility].  She has an appt with the care manager at the hospital this afternoon.

## 2012-06-21 NOTE — Assessment & Plan Note (Signed)
Patient has revised her intentions and brought a new MOST form for me to complete.  She prefers to have a DNR status.

## 2012-06-21 NOTE — Patient Instructions (Addendum)
You are doing well  You will need follow up labs in one month   Return to see me in 3 months

## 2012-06-21 NOTE — Progress Notes (Signed)
Patient ID: Madeline Perez, female   DOB: 10-30-1923, 79 y.o.   MRN: [id number]  Patient Active Problem List   Diagnosis Date Noted  . Chronic diastolic CHF (congestive heart failure) 04/01/2012  . Encounter for discussion of do not resuscitate order 01/22/2012  . Anxiety 10/14/2011  . Pes planus (flat feet) 10/14/2011  . Aortic valve stenosis 06/26/2011  . Pain of toe of right foot 02/16/2011  . Renal insufficiency 12/06/2010  . DYSPNEA 08/16/2009  . HYPERLIPIDEMIA-MIXED 04/20/2009  . HYPERTENSION, BENIGN 04/20/2009  . ATRIAL FIBRILLATION 04/20/2009  . EDEMA 04/20/2009    Subjective:  CC:   Chief Complaint  Patient presents with  . Follow-up    3 month    HPI:   Madeline Perez a 79 y.o. female who presents3 month follow up.  She feels fine but is Stressed out  By her husband's recent admission to [facility].  He has advanced progressive dementia and had a fall recently at home  which took them both down. He is also suffering from heart failure and remains in the hospitalize awaiting transfer to the [facility] but is running into  some roadblocks because insurance doesn't want to pay for his care.  She is overwhelmed .  Her son is helping her manage the financial affairs. She has been losing weight, and denies anorexia, chest pain or shortness of breath . No complaints other than anxiety and insomnia which is chronic.  She has brought her previously discussed MOST form and has asked me to revised her goals of care by changing her declaration to  DNR .    Past Medical History  Diagnosis Date  . Hypertension   . Hyperlipidemia   . Aortic stenosis     mild to moderate with estimated valve area of 1.2 cm in 2007  . Macular degeneration   . Chronic renal insufficiency   . CHF (congestive heart failure)   . Atrial fibrillation     Past Surgical History  Procedure Laterality Date  . Cholecystectomy  03/28/1998       The following portions of the patient's  history were reviewed and updated as appropriate: Allergies, current medications, and problem list.    Review of Systems:   Patient denies headache, fevers, malaise, unintentional weight loss, skin rash, eye pain, sinus congestion and sinus pain, sore throat, dysphagia,  hemoptysis , cough, dyspnea, wheezing, chest pain, palpitations, orthopnea, edema, abdominal pain, nausea, melena, diarrhea, constipation, flank pain, dysuria, hematuria, urinary  Frequency, nocturia, numbness, tingling, seizures,  Focal weakness, Loss of consciousness,  Tremor, insomnia, depression, anxiety, and suicidal ideation.     History   Social History  . Marital Status: Married    Spouse Name: N/A    Number of Children: N/A  . Years of Education: N/A   Occupational History  . Not on file.   Social History Main Topics  . Smoking status: Never Smoker   . Smokeless tobacco: Never Used  . Alcohol Use: No  . Drug Use: No  . Sexually Active: Not on file   Other Topics Concern  . Not on file   Social History Narrative  . No narrative on file    Objective:  BP 118/54  Pulse 67  Temp(Src) 98.2 F (36.8 C) (Oral)  Resp 14  Wt 151 lb 4 oz (68.607 kg)  BMI 25.95 kg/m2  SpO2 95%  General appearance: alert, cooperative and appears stated age Ears: normal TM's and external ear canals both ears Throat:  lips, mucosa, and tongue normal; teeth and gums normal Neck: no adenopathy, no carotid bruit, supple, symmetrical, trachea midline and thyroid not enlarged, symmetric, no tenderness/mass/nodules Back: symmetric, no curvature. ROM normal. No CVA tenderness. Lungs: clear to auscultation bilaterally Heart: regular rate and rhythm, S1, S2 normal, no murmur, click, rub or gallop Abdomen: soft, non-tender; bowel sounds normal; no masses,  no organomegaly Pulses: 2+ and symmetric Skin: Skin color, texture, turgor normal. No rashes or lesions Lymph nodes: Cervical, supraclavicular, and axillary nodes  normal.  Assessment and Plan:  HYPERTENSION, BENIGN Well controlled on current regimen by labs done in November, Cr 1. 28. Renal function stable, no changes today.  Anxiety Largely due to husband's continued decline.  She is concerned that her husband will not acclimate or be able to get into the Health [facility] at [facility].  She has an appt with the care manager at the hospital this afternoon.  Encounter for discussion of do not resuscitate order Patient has revised her intentions and brought a new MOST form for me to complete.  She prefers to have a DNR status.   Updated Medication List Outpatient Encounter Prescriptions as of 06/21/2012  Medication Sig Dispense Refill  . allopurinol (ZYLOPRIM) 100 MG tablet TAKE 1 TABLET BY MOUTH EVERY DAY  30 tablet  3  . amLODipine (NORVASC) 10 MG tablet Take 0.5 tablets (5 mg total) by mouth daily.  30 tablet  0  . beta carotene w/minerals (OCUVITE) tablet Take 1 tablet by mouth daily.        . CRESTOR 10 MG tablet TAKE 1 TABLET (10 MG TOTAL) BY MOUTH DAILY.  30 tablet  6  . furosemide (LASIX) 40 MG tablet Take 1 tablet (40 mg total) by mouth daily.  30 tablet  6  . metoprolol succinate (TOPROL-XL) 50 MG 24 hr tablet TAKE 1 TABLET EVERY DAY  90 tablet  3  . pramipexole (MIRAPEX) 0.25 MG tablet TAKE 1 TABLET BY MOUTH AT BEDTIME.  90 tablet  1  . propafenone (RYTHMOL) 225 MG tablet TAKE 1 TABLET 3 TIMES A DAY  270 tablet  3  . triamcinolone ointment (KENALOG) 0.1 %       . warfarin (COUMADIN) 2 MG tablet TAKE 1 TABLET BY MOUTH AS DIRECTED  90 tablet  1  . KLOR-CON M20 20 MEQ tablet TAKE 1 TABLET BY MOUTH ONCE A DAY  30 tablet  1   No facility-administered encounter medications on file as of 06/21/2012.     No orders of the defined types were placed in this encounter.    No Follow-up on file.

## 2012-06-21 NOTE — Assessment & Plan Note (Addendum)
Well controlled on current regimen by labs done in November, Cr 1. 28. Renal function stable, no changes today.

## 2012-06-30 ENCOUNTER — Encounter: Payer: Self-pay | Admitting: Cardiovascular Disease

## 2012-06-30 ENCOUNTER — Ambulatory Visit (INDEPENDENT_AMBULATORY_CARE_PROVIDER_SITE_OTHER): Payer: Medicare Other | Admitting: Cardiovascular Disease

## 2012-06-30 VITALS — BP 122/62 | HR 61 | Ht 64.0 in | Wt 151.8 lb

## 2012-06-30 DIAGNOSIS — I509 Heart failure, unspecified: Secondary | ICD-10-CM

## 2012-06-30 DIAGNOSIS — I5032 Chronic diastolic (congestive) heart failure: Secondary | ICD-10-CM

## 2012-06-30 DIAGNOSIS — R609 Edema, unspecified: Secondary | ICD-10-CM

## 2012-06-30 DIAGNOSIS — E785 Hyperlipidemia, unspecified: Secondary | ICD-10-CM

## 2012-06-30 DIAGNOSIS — R002 Palpitations: Secondary | ICD-10-CM

## 2012-06-30 DIAGNOSIS — N289 Disorder of kidney and ureter, unspecified: Secondary | ICD-10-CM

## 2012-06-30 DIAGNOSIS — I10 Essential (primary) hypertension: Secondary | ICD-10-CM

## 2012-06-30 MED ORDER — FUROSEMIDE 40 MG PO TABS: 40.0000 mg | ORAL_TABLET | Freq: Two times a day (BID) | ORAL | Status: DC | PRN

## 2012-06-30 NOTE — Assessment & Plan Note (Signed)
Lab work scheduled for next week by Dr. Darrick Huntsman

## 2012-06-30 NOTE — Assessment & Plan Note (Signed)
Worsening of her lower extremity edema. We have suggested he take diuretic twice a day for the next 3 days. Lab work scheduled for next week. We have suggested she monitor her fluid intake. She reports mouth is always dry. Extra Lasix as needed in the afternoon for worsening edema.

## 2012-06-30 NOTE — Assessment & Plan Note (Signed)
Blood pressure is well controlled on today's visit. No changes made to the medications. 

## 2012-06-30 NOTE — Progress Notes (Signed)
Patient ID: Madeline Perez, female    DOB: Mar 06, 1923, 79 y.o.   MRN: [id number]  HPI Comments: Madeline Perez is a 79 year old woman with history of paroxysmal atrial fibrillation, moderate aortic valve stenosis, chronic lower extremity edema, chronic renal insufficiency, Diastolic dysfunction, pulmonary hypertension who presents for routine followup.   hospital admission for acute diastolic CHF in 11/2011 after she stopped taking her diuretic.  Initial BNP was 8500, TSH 1.9, peak troponin 0.54  Echocardiogram showed normal LV function, moderately elevated right ventricular systolic pressures estimated at 50-60 mmHg, moderate aortic valve stenosis with mean gradient 31 mm of mercury, estimated aortic valve area 1.22 cm, moderate sized left pleural effusion, moderate TR She had aggressive diuresis in the hospital with Lasix IV twice a day, transitioning to Lasix by mouth twice a day, discharged on Lasix 40 mg daily. She was on oxygen by nasal cannula while in the hospital and at discharge. This has been weaned  Recent stressors as her husband has worsening dementia. Now in the skilled nursing at [facility]. Wonders if she made the right decision but she knows she is unable to care for him at home. Eating less, weight is slowly decreasing. She's not cooking for herself.    She has noticed increasing lower STEMI edema. Drinking excessive fluids. Occasional episodes of palpitations. One episode last week with palpitations lasting all morning likely consistent with atrial fibrillation   EKG shows normal sinus rhythm with rate of 61 beats per minute, left anterior fascicular block, intraventricular conduction delay, poor R-wave progression through the anterior precordial leads    Outpatient Encounter Prescriptions as of 06/30/2012  Medication Sig Dispense Refill  . allopurinol (ZYLOPRIM) 100 MG tablet TAKE 1 TABLET BY MOUTH EVERY DAY  30 tablet  3  . amLODipine (NORVASC) 10 MG tablet Take 0.5 tablets  (5 mg total) by mouth daily.  30 tablet  0  . beta carotene w/minerals (OCUVITE) tablet Take 1 tablet by mouth daily.        . CRESTOR 10 MG tablet TAKE 1 TABLET (10 MG TOTAL) BY MOUTH DAILY.  30 tablet  6  . furosemide (LASIX) 40 MG tablet Take 1 tablet (40 mg total) by mouth 2 (two) times daily as needed.  60 tablet  6  . KLOR-CON M20 20 MEQ tablet TAKE 1 TABLET BY MOUTH ONCE A DAY  30 tablet  1  . metoprolol succinate (TOPROL-XL) 50 MG 24 hr tablet TAKE 1 TABLET EVERY DAY  90 tablet  3  . pramipexole (MIRAPEX) 0.25 MG tablet TAKE 1 TABLET BY MOUTH AT BEDTIME.  90 tablet  1  . propafenone (RYTHMOL) 225 MG tablet TAKE 1 TABLET 3 TIMES A DAY  270 tablet  3  . warfarin (COUMADIN) 2 MG tablet TAKE 1 TABLET BY MOUTH AS DIRECTED  90 tablet  1  . [DISCONTINUED] furosemide (LASIX) 40 MG tablet Take 1 tablet (40 mg total) by mouth daily.  30 tablet  6  . [DISCONTINUED] triamcinolone ointment (KENALOG) 0.1 %         Review of Systems  Constitutional: Negative.   HENT: Negative.   Eyes: Negative.   Respiratory: Negative.   Cardiovascular: Negative.   Gastrointestinal: Negative.   Musculoskeletal: Negative.        Posterior shoulder and neck pain bilaterally, worse on the left  Skin: Negative.   Neurological: Negative.   Psychiatric/Behavioral: Negative.   All other systems reviewed and are negative.    BP 122/62  Pulse 61  Ht 5\' 4"  (1.626 m)  Wt 151 lb 12 oz (68.833 kg)  BMI 26.03 kg/m2  Physical Exam  Nursing note and vitals reviewed. Constitutional: She is oriented to person, place, and time. She appears well-developed and well-nourished.  HENT:  Head: Normocephalic.  Nose: Nose normal.  Mouth/Throat: Oropharynx is clear and moist.  Eyes: Conjunctivae are normal. Pupils are equal, round, and reactive to light.  Neck: Normal range of motion. Neck supple. No JVD present.  Cardiovascular: Normal rate, regular rhythm, S1 normal, S2 normal and intact distal pulses.  Exam reveals no  gallop and no friction rub.   Murmur heard.  Crescendo systolic murmur is present with a grade of 3/6  Pulmonary/Chest: Effort normal and breath sounds normal. No respiratory distress. She has no wheezes. She has no rales. She exhibits no tenderness.  Abdominal: Soft. Bowel sounds are normal. She exhibits no distension. There is no tenderness.  Musculoskeletal: Normal range of motion. She exhibits no edema and no tenderness.  Lymphadenopathy:    She has no cervical adenopathy.  Neurological: She is alert and oriented to person, place, and time. Coordination normal.  Skin: Skin is warm and dry. No rash noted. No erythema.  Psychiatric: She has a normal mood and affect. Her behavior is normal. Judgment and thought content normal.    Assessment and Plan

## 2012-06-30 NOTE — Assessment & Plan Note (Signed)
We have suggested she stay on her Crestor

## 2012-06-30 NOTE — Assessment & Plan Note (Signed)
She does have a component of chronic edema. Worsening edema recently probably from extra fluid intake. Will take several days of extra Lasix in the afternoon.

## 2012-06-30 NOTE — Patient Instructions (Addendum)
You are doing well.  Please take lasix twice a day for three days with potassium for leg swelling Take lasix twice a day anytime you need it for swelling  Please call us if you have new issues that need to be addressed before your next appt.  Your physician wants you to follow-up in: 3 months.  You will receive a reminder letter in the mail two months in advance. If you don't receive a letter, please call our office to schedule the follow-up appointment.

## 2012-07-09 ENCOUNTER — Other Ambulatory Visit: Payer: Self-pay | Admitting: Cardiovascular Disease

## 2012-07-09 NOTE — Telephone Encounter (Signed)
Please review and refill. Thank You

## 2012-07-12 ENCOUNTER — Telehealth: Payer: Self-pay | Admitting: *Deleted

## 2012-07-12 NOTE — Telephone Encounter (Signed)
Pt is coming in for labs tomorrow 05.20.2014, is it only for a pt/inr?

## 2012-07-13 ENCOUNTER — Encounter: Payer: Self-pay | Admitting: Adult Health

## 2012-07-13 ENCOUNTER — Ambulatory Visit (INDEPENDENT_AMBULATORY_CARE_PROVIDER_SITE_OTHER): Payer: Medicare Other | Admitting: Adult Health

## 2012-07-13 ENCOUNTER — Other Ambulatory Visit: Payer: Self-pay | Admitting: Adult Health

## 2012-07-13 ENCOUNTER — Other Ambulatory Visit: Payer: Medicare Other

## 2012-07-13 VITALS — BP 128/50 | HR 67 | Temp 97.9°F | Resp 12 | Wt 155.0 lb

## 2012-07-13 DIAGNOSIS — R899 Unspecified abnormal finding in specimens from other organs, systems and tissues: Secondary | ICD-10-CM

## 2012-07-13 DIAGNOSIS — I509 Heart failure, unspecified: Secondary | ICD-10-CM

## 2012-07-13 DIAGNOSIS — I5032 Chronic diastolic (congestive) heart failure: Secondary | ICD-10-CM

## 2012-07-13 DIAGNOSIS — R5381 Other malaise: Secondary | ICD-10-CM

## 2012-07-13 DIAGNOSIS — M810 Age-related osteoporosis without current pathological fracture: Secondary | ICD-10-CM

## 2012-07-13 DIAGNOSIS — I4891 Unspecified atrial fibrillation: Secondary | ICD-10-CM

## 2012-07-13 DIAGNOSIS — R5383 Other fatigue: Secondary | ICD-10-CM | POA: Insufficient documentation

## 2012-07-13 LAB — CBC WITH DIFFERENTIAL/PLATELET
Basophils Absolute: 0 10*3/uL (ref 0.0–0.1)
Eosinophils Absolute: 0.1 10*3/uL (ref 0.0–0.7)
Lymphocytes Relative: 11.6 % — ABNORMAL LOW (ref 12.0–46.0)
MCHC: 33.5 g/dL (ref 30.0–36.0)
MCV: 94.1 fl (ref 78.0–100.0)
Monocytes Absolute: 0.5 10*3/uL (ref 0.1–1.0)
Neutrophils Relative %: 81.6 % — ABNORMAL HIGH (ref 43.0–77.0)
Platelets: 190 10*3/uL (ref 150.0–400.0)
RBC: 3.87 Mil/uL (ref 3.87–5.11)
RDW: 14.7 % — ABNORMAL HIGH (ref 11.5–14.6)

## 2012-07-13 LAB — BASIC METABOLIC PANEL
BUN: 27 mg/dL — ABNORMAL HIGH (ref 6–23)
CO2: 27 mEq/L (ref 19–32)
Calcium: 9.2 mg/dL (ref 8.4–10.5)
Creatinine, Ser: 1.5 mg/dL — ABNORMAL HIGH (ref 0.4–1.2)
Potassium: 3.8 mEq/L (ref 3.5–5.1)
Sodium: 138 mEq/L (ref 135–145)

## 2012-07-13 LAB — TSH: TSH: 1.69 u[IU]/mL (ref 0.35–5.50)

## 2012-07-13 MED ORDER — TRAZODONE HCL 50 MG PO TABS: ORAL_TABLET | ORAL | Status: AC

## 2012-07-13 NOTE — Assessment & Plan Note (Signed)
Suspect fatigue is multifactorial. Patient is under a lot of stress with her current situation with her husband. She is not sleeping which may also be contributing further to her fatigue. Check labs: CBC, metabolic panel, thyroid, vitamin D. I have ordered trazodone 50 mg one half tablet at bedtime as needed for sleep. May increase to full tab if half a tablet not effective

## 2012-07-13 NOTE — Assessment & Plan Note (Signed)
Stable. Patient without any signs of acute symptoms. Continue Lasix 40 mg daily with additional 40 mg a couple of times a week as instructed by cardiology. Continue to weigh daily. If weight greater than 3 pounds overnight or 5 pounds in one week, report immediately. Report any symptoms of shortness of breath immediately. Elevate lower extremities while sitting.

## 2012-07-13 NOTE — Progress Notes (Signed)
  Subjective:    Patient ID: Madeline Perez, female    DOB: 1923-11-04, 79 y.o.   MRN: [id number]  HPI  Patient is a pleasant 79 year old female with multiple medical problems including hypertension, hyperlipidemia, atrial fibrillation currently on Coumadin, chronic diastolic heart failure, renal insufficiency who presents to clinic with concerns of:  "I have a history of CHF".  I have been feeling very tired. Her husband is in the health center at [facility] and she is running back and forth. She has been hearing herself wheeze. Denies shortness of breath. She feels her LE are swollen worse than usual. She has not been able to elevate her legs. She weighs daily and has been between 152 and 154 pounds. She is concerned that the wheezing is due to fluid in her lungs. Patient takes Lasix 40 mg daily and has been told to take an additional 40 mg a couple of times a week.   She has not been able to sleep. She is under a lot of stress because of the situation with her husband.  She is waking him daily around 3 AM and unable to return to sleep.   Review of Systems  Constitutional: Positive for fatigue. Negative for fever and chills.  Respiratory: Positive for wheezing. Negative for cough and shortness of breath.   Cardiovascular: Positive for leg swelling. Negative for chest pain.    BP 128/50  Pulse 67  Temp(Src) 97.9 F (36.6 C) (Oral)  Resp 12  Wt 155 lb (70.308 kg)  BMI 26.59 kg/m2  SpO2 97%    Objective:   Physical Exam  Constitutional: She is oriented to person, place, and time. She appears well-developed and well-nourished. No distress.  Cardiovascular: Normal rate and regular rhythm.   Murmur heard. Aortic stenosis. Systolic murmur  Pulmonary/Chest: Effort normal and breath sounds normal. No respiratory distress. She has no wheezes. She has no rales.  Musculoskeletal: She exhibits edema.  Bilateral lower extremity with 1+ edema  Neurological: She is alert and oriented to person,  place, and time.  Skin: Skin is warm and dry.  Psychiatric: She has a normal mood and affect. Her behavior is normal. Judgment and thought content normal.       Assessment & Plan:

## 2012-07-13 NOTE — Patient Instructions (Addendum)
  Please have your labs drawn prior to leaving the office.  Once we receive the results the office will contact you.  Continue to take your lasix as prescribed  Your lungs were clear without any wheezing or fluid heard.  I am prescribing trazodone for sleep. Take 1/2 tablet right before bedtime for sleep. If you find that 1/2 tablet is not enough, then take 1 tablet. This medication will make you sleepy. Use caution when getting up.

## 2012-07-14 ENCOUNTER — Ambulatory Visit (INDEPENDENT_AMBULATORY_CARE_PROVIDER_SITE_OTHER): Payer: Medicare Other

## 2012-07-14 DIAGNOSIS — I4891 Unspecified atrial fibrillation: Secondary | ICD-10-CM

## 2012-07-14 LAB — POCT INR: INR: 2.3

## 2012-07-14 NOTE — Progress Notes (Signed)
Left message for pt to return my call.

## 2012-07-16 ENCOUNTER — Other Ambulatory Visit (INDEPENDENT_AMBULATORY_CARE_PROVIDER_SITE_OTHER): Payer: Medicare Other

## 2012-07-16 DIAGNOSIS — R6889 Other general symptoms and signs: Secondary | ICD-10-CM

## 2012-07-16 DIAGNOSIS — R899 Unspecified abnormal finding in specimens from other organs, systems and tissues: Secondary | ICD-10-CM

## 2012-07-16 LAB — BASIC METABOLIC PANEL
BUN: 24 mg/dL — ABNORMAL HIGH (ref 6–23)
Calcium: 8.9 mg/dL (ref 8.4–10.5)
Creatinine, Ser: 1.4 mg/dL — ABNORMAL HIGH (ref 0.4–1.2)
GFR: 37.85 mL/min — ABNORMAL LOW (ref >60.00)

## 2012-07-17 ENCOUNTER — Inpatient Hospital Stay: Payer: Self-pay | Admitting: Internal Medicine

## 2012-07-17 LAB — CK TOTAL AND CKMB (NOT AT ARMC)
CK-MB: <0.5 ng/mL — ABNORMAL LOW (ref 0.5–3.6)
CK-MB: 0.9 ng/mL (ref 0.5–3.6)

## 2012-07-17 LAB — CBC
HGB: 10.7 g/dL — ABNORMAL LOW (ref 12.0–16.0)
MCH: 31.6 pg (ref 26.0–34.0)
MCHC: 34.1 g/dL (ref 32.0–36.0)
Platelet: 209 10*3/uL (ref 150–440)
RBC: 3.39 10*6/uL — ABNORMAL LOW (ref 3.80–5.20)
RDW: 14.5 % (ref 11.5–14.5)
WBC: 15.5 10*3/uL — ABNORMAL HIGH (ref 3.6–11.0)

## 2012-07-17 LAB — URINALYSIS, COMPLETE
Glucose,UR: NEGATIVE mg/dL (ref 0–75)
Nitrite: POSITIVE
Protein: NEGATIVE
RBC,UR: 2 /HPF (ref 0–5)
Specific Gravity: 1.012 (ref 1.003–1.030)
Squamous Epithelial: <1

## 2012-07-17 LAB — COMPREHENSIVE METABOLIC PANEL
Albumin: 3.3 g/dL — ABNORMAL LOW (ref 3.4–5.0)
BUN: 23 mg/dL — ABNORMAL HIGH (ref 7–18)
Bilirubin,Total: 1.1 mg/dL — ABNORMAL HIGH (ref 0.2–1.0)
Calcium, Total: 8.9 mg/dL (ref 8.5–10.1)
Co2: 27 mmol/L (ref 21–32)
Creatinine: 1.24 mg/dL (ref 0.60–1.30)
EGFR (African American): 46 — ABNORMAL LOW
EGFR (Non-African Amer.): 39 — ABNORMAL LOW
Osmolality: 279 (ref 275–301)
Potassium: 4 mmol/L (ref 3.5–5.1)
SGPT (ALT): 14 U/L (ref 12–78)
Sodium: 137 mmol/L (ref 136–145)

## 2012-07-17 LAB — TROPONIN I
Troponin-I: <0.02 ng/mL
Troponin-I: 0.08 ng/mL — ABNORMAL HIGH

## 2012-07-17 LAB — PROTIME-INR: Prothrombin Time: 23.6 secs — ABNORMAL HIGH (ref 11.5–14.7)

## 2012-07-17 LAB — PRO B NATRIURETIC PEPTIDE: B-Type Natriuretic Peptide: 5824 pg/mL — ABNORMAL HIGH (ref 0–450)

## 2012-07-18 DIAGNOSIS — J984 Other disorders of lung: Secondary | ICD-10-CM

## 2012-07-18 DIAGNOSIS — I5033 Acute on chronic diastolic (congestive) heart failure: Secondary | ICD-10-CM

## 2012-07-18 LAB — URINALYSIS, COMPLETE
Bacteria: NONE SEEN
Glucose,UR: NEGATIVE mg/dL (ref 0–75)
Ketone: NEGATIVE
Nitrite: POSITIVE
WBC UR: 16 /HPF (ref 0–5)

## 2012-07-18 LAB — CBC WITH DIFFERENTIAL/PLATELET
Basophil %: 0.5 %
Eosinophil #: 0 10*3/uL (ref 0.0–0.7)
Eosinophil %: 0.2 %
HCT: 32.7 % — ABNORMAL LOW (ref 35.0–47.0)
HGB: 11.4 g/dL — ABNORMAL LOW (ref 12.0–16.0)
Lymphocyte #: 1 10*3/uL (ref 1.0–3.6)
Lymphocyte %: 9 %
MCHC: 34.9 g/dL (ref 32.0–36.0)
Neutrophil #: 9.3 10*3/uL — ABNORMAL HIGH (ref 1.4–6.5)
RDW: 14.4 % (ref 11.5–14.5)
WBC: 11 10*3/uL (ref 3.6–11.0)

## 2012-07-18 LAB — BASIC METABOLIC PANEL
BUN: 23 mg/dL — ABNORMAL HIGH (ref 7–18)
Chloride: 100 mmol/L (ref 98–107)
EGFR (African American): 39 — ABNORMAL LOW
Glucose: 98 mg/dL (ref 65–99)
Osmolality: 276 (ref 275–301)

## 2012-07-18 LAB — CK TOTAL AND CKMB (NOT AT ARMC)
CK, Total: 86 U/L (ref 21–215)
CK-MB: 0.8 ng/mL (ref 0.5–3.6)

## 2012-07-18 LAB — HEMOGLOBIN A1C: Hemoglobin A1C: 4.7 % (ref 4.2–6.3)

## 2012-07-18 LAB — LIPID PANEL
Cholesterol: 123 mg/dL (ref 0–200)
HDL Cholesterol: 60 mg/dL (ref 40–60)
Triglycerides: 85 mg/dL (ref 0–200)
VLDL Cholesterol, Calc: 17 mg/dL (ref 5–40)

## 2012-07-19 LAB — BASIC METABOLIC PANEL
Anion Gap: 8 (ref 7–16)
BUN: 27 mg/dL — ABNORMAL HIGH (ref 7–18)
Co2: 28 mmol/L (ref 21–32)
Glucose: 87 mg/dL (ref 65–99)
Osmolality: 282 (ref 275–301)
Potassium: 3.4 mmol/L — ABNORMAL LOW (ref 3.5–5.1)

## 2012-07-19 LAB — PROTIME-INR: INR: 2.4

## 2012-07-19 LAB — CBC WITH DIFFERENTIAL/PLATELET
Basophil %: 0.6 %
Eosinophil #: 0.1 10*3/uL (ref 0.0–0.7)
Eosinophil %: 1.1 %
HCT: 30.6 % — ABNORMAL LOW (ref 35.0–47.0)
MCH: 32.2 pg (ref 26.0–34.0)
MCV: 93 fL (ref 80–100)
Platelet: 150 10*3/uL (ref 150–440)
RBC: 3.31 10*6/uL — ABNORMAL LOW (ref 3.80–5.20)
RDW: 14.5 % (ref 11.5–14.5)
WBC: 9 10*3/uL (ref 3.6–11.0)

## 2012-07-20 LAB — BASIC METABOLIC PANEL
BUN: 35 mg/dL — ABNORMAL HIGH (ref 7–18)
Calcium, Total: 8.4 mg/dL — ABNORMAL LOW (ref 8.5–10.1)
Co2: 30 mmol/L (ref 21–32)
Creatinine: 1.74 mg/dL — ABNORMAL HIGH (ref 0.60–1.30)
Glucose: 92 mg/dL (ref 65–99)
Osmolality: 280 (ref 275–301)
Sodium: 136 mmol/L (ref 136–145)

## 2012-07-20 NOTE — Progress Notes (Signed)
Left message for pt to return my call.

## 2012-07-21 ENCOUNTER — Telehealth: Payer: Self-pay

## 2012-07-21 DIAGNOSIS — I509 Heart failure, unspecified: Secondary | ICD-10-CM

## 2012-07-21 LAB — BASIC METABOLIC PANEL
BUN: 37 mg/dL — ABNORMAL HIGH (ref 7–18)
Calcium, Total: 8.9 mg/dL (ref 8.5–10.1)
Co2: 30 mmol/L (ref 21–32)
Creatinine: 1.37 mg/dL — ABNORMAL HIGH (ref 0.60–1.30)
EGFR (Non-African Amer.): 35 — ABNORMAL LOW
Glucose: 85 mg/dL (ref 65–99)
Potassium: 4 mmol/L (ref 3.5–5.1)
Sodium: 138 mmol/L (ref 136–145)

## 2012-07-21 LAB — PROTIME-INR: Prothrombin Time: 23.9 secs — ABNORMAL HIGH (ref 11.5–14.7)

## 2012-07-21 NOTE — Telephone Encounter (Signed)
Message copied by Marcelle Overlie on Wed Jul 21, 2012  1:53 PM ------      Message from: Coralee Rud      Created: Wed Jul 21, 2012  1:40 PM      Regarding: tcm/ph       6/9/ at 3:30 dr Mariah Milling ------

## 2012-07-21 NOTE — Telephone Encounter (Signed)
Message copied by Marcelle Overlie on Wed Jul 21, 2012  2:37 PM ------      Message from: Coralee Rud      Created: Wed Jul 21, 2012  2:25 PM      Regarding: tcm/ph       Tcm/ph dr Mariah Milling 08/02/12 3:30 ------

## 2012-07-21 NOTE — Telephone Encounter (Signed)
tcm

## 2012-07-21 NOTE — Telephone Encounter (Signed)
Pt d/c home today 5/28 Will attempt TCM #1 5/29

## 2012-07-21 NOTE — Telephone Encounter (Signed)
TCM  

## 2012-07-22 NOTE — Telephone Encounter (Signed)
Patient contacted regarding discharge from [facility] on 07/21/12.  Patient understands to follow up with provider Dr. Mariah Milling on 08/02/12 at 3:30 pm at [facility] office. Patient understands discharge instructions? yes Patient understands medications and regiment? yes Patient understands to bring all medications to this visit? yes  Pt denies worsening sob or edema. Weighed herself this am. She weighed 145 pounds this am. When she was admitted to [facility] Saturday 5/24 she weighed 156 pounds. She has since been diuresed in hosp. She understands to weigh herself daily and to call us should she gain/lose >/= 2+ pounds overnight/5 pounds in 1 week. O2 was delivered last night and she confirms compliance with this  She lives at [facility] and has a meeting scheduled with their nurse today to discuss needs, etc She will call us prior to 6/9 appt if she needs [place]

## 2012-07-27 ENCOUNTER — Other Ambulatory Visit: Payer: Self-pay | Admitting: Internal Medicine

## 2012-07-30 ENCOUNTER — Other Ambulatory Visit: Payer: Self-pay | Admitting: *Deleted

## 2012-07-30 ENCOUNTER — Other Ambulatory Visit: Payer: Self-pay | Admitting: Internal Medicine

## 2012-07-30 NOTE — Telephone Encounter (Signed)
Pharmacy Note:  Amlodipine Besylate 10 mg   Pt says she taking 1 tablet not 1/2 tablet

## 2012-08-02 ENCOUNTER — Encounter: Payer: Self-pay | Admitting: Cardiovascular Disease

## 2012-08-02 ENCOUNTER — Ambulatory Visit (INDEPENDENT_AMBULATORY_CARE_PROVIDER_SITE_OTHER): Payer: Medicare Other | Admitting: Cardiovascular Disease

## 2012-08-02 VITALS — BP 120/52 | HR 73 | Ht 64.0 in | Wt 151.8 lb

## 2012-08-02 DIAGNOSIS — I5032 Chronic diastolic (congestive) heart failure: Secondary | ICD-10-CM

## 2012-08-02 DIAGNOSIS — I10 Essential (primary) hypertension: Secondary | ICD-10-CM

## 2012-08-02 DIAGNOSIS — I350 Nonrheumatic aortic (valve) stenosis: Secondary | ICD-10-CM

## 2012-08-02 DIAGNOSIS — I359 Nonrheumatic aortic valve disorder, unspecified: Secondary | ICD-10-CM

## 2012-08-02 DIAGNOSIS — N289 Disorder of kidney and ureter, unspecified: Secondary | ICD-10-CM

## 2012-08-02 DIAGNOSIS — I509 Heart failure, unspecified: Secondary | ICD-10-CM

## 2012-08-02 DIAGNOSIS — I4891 Unspecified atrial fibrillation: Secondary | ICD-10-CM

## 2012-08-02 MED ORDER — FUROSEMIDE 40 MG PO TABS: 40.0000 mg | ORAL_TABLET | Freq: Two times a day (BID) | ORAL | Status: DC

## 2012-08-02 NOTE — Assessment & Plan Note (Signed)
Heart rate is relatively well controlled. She's on warfarin

## 2012-08-02 NOTE — Progress Notes (Signed)
Patient ID: Madeline Perez, female    DOB: June 22, 1923, 79 y.o.   MRN: [id number]  HPI Comments: Madeline Perez is a 79 year old woman with history of paroxysmal atrial fibrillation, moderate aortic valve stenosis, chronic lower extremity edema, chronic renal insufficiency, Diastolic dysfunction, pulmonary hypertension who presents for routine followup.   hospital admission for acute diastolic CHF in 11/2011 after she stopped taking her diuretic.   Echocardiogram showed normal LV function, moderately elevated right ventricular systolic pressures estimated at 50-60 mmHg, moderate aortic valve stenosis with mean gradient 31 mm of mercury, estimated aortic valve area 1.22 cm, moderate sized left pleural effusion, moderate TR  She had aggressive diuresis in the hospital with Lasix IV twice a day, transitioning to Lasix by mouth twice a day, discharged on Lasix 40 mg daily. She was on oxygen by nasal cannula while in the hospital and at discharge. This has been weaned   Madeline Perez has worsening dementia. Now in nursing facility    Recently admitted back to the hospital with shortness of breath. She was taking Lasix daily but eating a lot of TV dinners and canned foods. She gained 5 pounds and had worsening cough and wheezing so she presented to the emergency room. BNP on arrival was 5800, creatinine 1.24. This increased to 1.37 at the time of discharge She was treated with Lasix IV twice a day with significant weight drop into the 146 range. Chest x-ray in the hospital showed small bilateral pleural effusions. She was discharged on oxygen and has weaned herself down to oxygen only at nighttime just for comfort. She presents today and reports that her weight is up 5 pounds from when she left the hospital. She's been taking Lasix 40 mg daily.  She states that her legs are feeling weak.   EKG shows normal sinus rhythm with rate of 73 beats per minute, left anterior fascicular block, intraventricular conduction  delay, poor R-wave progression through the anterior precordial leads   Outpatient Encounter Prescriptions as of 08/02/2012  Medication Sig Dispense Refill  . allopurinol (ZYLOPRIM) 100 MG tablet TAKE 1 TABLET BY MOUTH EVERY DAY  30 tablet  5  . amLODipine (NORVASC) 10 MG tablet Take 0.5 tablets (5 mg total) by mouth daily.  30 tablet  0  . beta carotene w/minerals (OCUVITE) tablet Take 1 tablet by mouth daily.        . CRESTOR 10 MG tablet TAKE 1 TABLET (10 MG TOTAL) BY MOUTH DAILY.  30 tablet  6  . furosemide (LASIX) 40 MG tablet Take 1 tablet (40 mg total) by mouth 2 (two) times daily.  60 tablet  6  . KLOR-CON M20 20 MEQ tablet TAKE 1 TABLET BY MOUTH ONCE A DAY  30 tablet  1  . metoprolol succinate (TOPROL-XL) 50 MG 24 hr tablet TAKE 1 TABLET EVERY DAY  90 tablet  3  . pramipexole (MIRAPEX) 0.25 MG tablet TAKE 1 TABLET BY MOUTH AT BEDTIME.  90 tablet  1  . propafenone (RYTHMOL) 225 MG tablet TAKE 1 TABLET 3 TIMES A DAY  270 tablet  3  . traZODone (DESYREL) 50 MG tablet Take 1/2 tablet at bedtime as needed for sleep.  30 tablet  3  . warfarin (COUMADIN) 2 MG tablet TAKE 1 TABLET BY MOUTH AS DIRECTED  90 tablet  1    Review of Systems  Constitutional: Negative.   HENT: Negative.   Eyes: Negative.   Respiratory: Negative.   Cardiovascular: Negative.   Gastrointestinal: Negative.  Musculoskeletal: Negative.        Posterior shoulder and neck pain bilaterally, worse on the left  Skin: Negative.   Neurological: Negative.   Psychiatric/Behavioral: Negative.   All other systems reviewed and are negative.    BP 120/52  Pulse 73  Ht 5\' 4"  (1.626 m)  Wt 151 lb 12 oz (68.833 kg)  BMI 26.03 kg/m2  Physical Exam  Nursing note and vitals reviewed. Constitutional: She is oriented to person, place, and time. She appears well-developed and well-nourished.  HENT:  Head: Normocephalic.  Nose: Nose normal.  Mouth/Throat: Oropharynx is clear and moist.  Eyes: Conjunctivae are normal.  Pupils are equal, round, and reactive to light.  Neck: Normal range of motion. Neck supple. No JVD present.  Cardiovascular: Normal rate, regular rhythm, S1 normal, S2 normal and intact distal pulses.  Exam reveals no gallop and no friction rub.   Murmur heard.  Crescendo systolic murmur is present with a grade of 3/6  Trace lower extremity edema  Pulmonary/Chest: Effort normal and breath sounds normal. No respiratory distress. She has no wheezes. She has no rales. She exhibits no tenderness.  Abdominal: Soft. Bowel sounds are normal. She exhibits no distension. There is no tenderness.  Musculoskeletal: Normal range of motion. She exhibits no edema and no tenderness.  Lymphadenopathy:    She has no cervical adenopathy.  Neurological: She is alert and oriented to person, place, and time. Coordination normal.  Skin: Skin is warm and dry. No rash noted. No erythema.  Psychiatric: She has a normal mood and affect. Her behavior is normal. Judgment and thought content normal.    Assessment and Plan

## 2012-08-02 NOTE — Assessment & Plan Note (Signed)
Recent admission to the hospital for acute on chronic diastolic CHF. Based on her recent weights, ideal weight is less than 150 pounds, preferably close to 146 pounds.. we have suggested she start Lasix 40 mg twice a day alternating with Lasix 40 mg daily. She has significant by mouth fluid intake at home. She does not do well when she is more hydrated and has shortness of breath. Will likely need to run her mildly dry for symptom relief. Have talked to her about her high sodium diet. He is living out of the freezer as her husband is in skilled nursing.

## 2012-08-02 NOTE — Assessment & Plan Note (Signed)
Renal function stable despite recent diuresis in the hospital. Overall seems to be doing well even on higher dose Lasix. Creatinine when she left the hospital after diuresis was 1.37. Weight at that time was 146 pounds.

## 2012-08-02 NOTE — Patient Instructions (Addendum)
You are doing well.  Please take lasix 40 mg daily, alternating with lasix 40 mg twice a day Hold lasix for weight less than 147 If weight is above 152,call the office and take lasix 40 mg twice a day everyday until the weight come back to less than 150  Please call us if you have new issues that need to be addressed before your next appt.  Your physician wants you to follow-up in: 3 months.  You will receive a reminder letter in the mail two months in advance. If you don't receive a letter, please call our office to schedule the follow-up appointment.

## 2012-08-02 NOTE — Assessment & Plan Note (Signed)
Blood pressure is well controlled on today's visit. No changes made to the medications. 

## 2012-08-02 NOTE — Assessment & Plan Note (Signed)
Moderate valve stenosis. Reevaluate next year

## 2012-08-06 ENCOUNTER — Encounter: Payer: Self-pay | Admitting: Internal Medicine

## 2012-08-06 ENCOUNTER — Ambulatory Visit (INDEPENDENT_AMBULATORY_CARE_PROVIDER_SITE_OTHER): Payer: Medicare Other | Admitting: Internal Medicine

## 2012-08-06 VITALS — BP 128/66 | HR 61 | Temp 98.3°F | Resp 14 | Wt 150.0 lb

## 2012-08-06 DIAGNOSIS — D649 Anemia, unspecified: Secondary | ICD-10-CM

## 2012-08-06 DIAGNOSIS — D692 Other nonthrombocytopenic purpura: Secondary | ICD-10-CM | POA: Insufficient documentation

## 2012-08-06 DIAGNOSIS — Z9181 History of falling: Secondary | ICD-10-CM

## 2012-08-06 DIAGNOSIS — R5383 Other fatigue: Secondary | ICD-10-CM

## 2012-08-06 DIAGNOSIS — I4891 Unspecified atrial fibrillation: Secondary | ICD-10-CM

## 2012-08-06 DIAGNOSIS — R2681 Unsteadiness on feet: Secondary | ICD-10-CM

## 2012-08-06 DIAGNOSIS — M2140 Flat foot [pes planus] (acquired), unspecified foot: Secondary | ICD-10-CM

## 2012-08-06 DIAGNOSIS — N289 Disorder of kidney and ureter, unspecified: Secondary | ICD-10-CM

## 2012-08-06 DIAGNOSIS — R269 Unspecified abnormalities of gait and mobility: Secondary | ICD-10-CM

## 2012-08-06 DIAGNOSIS — I5033 Acute on chronic diastolic (congestive) heart failure: Secondary | ICD-10-CM

## 2012-08-06 DIAGNOSIS — R21 Rash and other nonspecific skin eruption: Secondary | ICD-10-CM

## 2012-08-06 DIAGNOSIS — I509 Heart failure, unspecified: Secondary | ICD-10-CM

## 2012-08-06 DIAGNOSIS — I5032 Chronic diastolic (congestive) heart failure: Secondary | ICD-10-CM

## 2012-08-06 LAB — CBC WITH DIFFERENTIAL/PLATELET
Eosinophils Absolute: 0.2 10*3/uL (ref 0.0–0.7)
Eosinophils Relative: 1.7 % (ref 0.0–5.0)
HCT: 35.9 % — ABNORMAL LOW (ref 36.0–46.0)
Lymphs Abs: 1.4 10*3/uL (ref 0.7–4.0)
MCHC: 33.1 g/dL (ref 30.0–36.0)
MCV: 94.8 fl (ref 78.0–100.0)
Monocytes Absolute: 0.6 10*3/uL (ref 0.1–1.0)
Neutrophils Relative %: 75.5 % (ref 43.0–77.0)
Platelets: 223 10*3/uL (ref 150.0–400.0)
RDW: 14.9 % — ABNORMAL HIGH (ref 11.5–14.6)

## 2012-08-06 LAB — IRON AND TIBC: Iron: 71 ug/dL (ref 42–145)

## 2012-08-06 LAB — BASIC METABOLIC PANEL
BUN: 24 mg/dL — ABNORMAL HIGH (ref 6–23)
Calcium: 9.5 mg/dL (ref 8.4–10.5)
Creatinine, Ser: 1.4 mg/dL — ABNORMAL HIGH (ref 0.4–1.2)
GFR: 36.93 mL/min — ABNORMAL LOW (ref >60.00)
Glucose, Bld: 85 mg/dL (ref 70–99)

## 2012-08-06 LAB — FERRITIN: Ferritin: 95.6 ng/mL (ref 10.0–291.0)

## 2012-08-06 MED ORDER — AMLODIPINE BESYLATE 5 MG PO TABS: 5.0000 mg | ORAL_TABLET | Freq: Every day | ORAL | Status: AC

## 2012-08-06 NOTE — Patient Instructions (Addendum)
Please start using Eucerin cream on your legs one or twice daily moisturize your legs  The swelling and redness is due to venous insufficiency .  You need to wear your compression stockings as much as you can tolerate  I am checking your platelet count today because of your rash   If your insurance will not cover the walker,  Let me know so we can schedule a "mobility evaluation"

## 2012-08-06 NOTE — Progress Notes (Addendum)
Patient ID: Madeline Perez, female   DOB: 07-02-1923, 79 y.o.   MRN: [id number]   Patient Active Problem List   Diagnosis Date Noted  . Purpura 08/06/2012  . Fatigue 07/13/2012  . Chronic diastolic CHF (congestive heart failure) 04/01/2012  . Encounter for discussion of do not resuscitate order 01/22/2012  . Anxiety 10/14/2011  . Pes planus (flat feet) 10/14/2011  . Aortic valve stenosis 06/26/2011  . Pain of toe of right foot 02/16/2011  . Renal insufficiency 12/06/2010  . DYSPNEA 08/16/2009  . HYPERLIPIDEMIA-MIXED 04/20/2009  . HYPERTENSION, BENIGN 04/20/2009  . ATRIAL FIBRILLATION 04/20/2009  . EDEMA 04/20/2009    Subjective:  CC:   Chief Complaint  Patient presents with  . Follow-up    hospital for fluid in lungs reported by patient. Patient has red strawberry colored  marking to right side of face.    HPI:   Madeline Perez a 79 y.o. female who presents for follow up on multiple chronic conditions including congestive heart failure with recent hospitalization for acute respiratory failure with pulmonary edema and small bilateral pleural effusions and acute renal failure secondary to overdiuresis. Had hospital follow up last week with Dr Mariah Milling. She is weighing herself daily and takign extre doses of lasxi as directed for a weight gain of 1 lb overnight . Her weigth has been relatively stable.   Her cc is persistent fatigue despite sleeping an average of 7 hours per night and no history of snoring, OSA  Or nocturia.  Husband remains in the memory care facility and she continues to be overwhelmed by the insurance company's request for information regarding the nature of his disability among other issues formerly handled by her husband Thelma Barge.   She is having increasing difficulyy ambulating without assistance due to dyspnea  And bilateral foot pain due to pes planus.  She has no history of recent falls, but has to stop frequently to rest .  She is requesting a walker to aid  her with her walks to the dining hall and to visit husband.         Past Medical History  Diagnosis Date  . Hypertension   . Hyperlipidemia   . Aortic stenosis     mild to moderate with estimated valve area of 1.2 cm in 2007  . Macular degeneration   . Chronic renal insufficiency   . CHF (congestive heart failure)   . Atrial fibrillation     Past Surgical History  Procedure Laterality Date  . Cholecystectomy  03/28/1998       The following portions of the patient's history were reviewed and updated as appropriate: Allergies, current medications, and problem list.    Review of Systems:   Patient denies headache, fevers, malaise, unintentional weight loss,, eye pain, sinus congestion and sinus pain, sore throat, dysphagia,  hemoptysis , cough,  wheezing, chest pain, palpitations, orthopnea, edema, abdominal pain, nausea, melena, diarrhea, constipation, flank pain, dysuria, hematuria, urinary  Frequency, nocturia, numbness, tingling, seizures,   Loss of consciousness,  Tremor, insomnia, depression, anxiety, and suicidal ideation.        History   Social History  . Marital Status: Married    Spouse Name: N/A    Number of Children: N/A  . Years of Education: N/A   Occupational History  . Not on file.   Social History Main Topics  . Smoking status: Never Smoker   . Smokeless tobacco: Never Used  . Alcohol Use: No  . Drug Use: No  . Sexually  Active: Not on file   Other Topics Concern  . Not on file   Social History Narrative  . No narrative on file    Objective:  BP 128/66  Pulse 61  Temp(Src) 98.3 F (36.8 C) (Oral)  Resp 14  Wt 150 lb (68.04 kg)  BMI 25.73 kg/m2  SpO2 98%  General appearance: alert, cooperative and appears stated age Ears: normal TM's and external ear canals both ears Throat: lips, mucosa, and tongue normal; teeth and gums normal Neck: no adenopathy, no carotid bruit, supple, symmetrical, trachea midline and thyroid not enlarged,  symmetric, no tenderness/mass/nodules Back: symmetric, no curvature. ROM normal. No CVA tenderness. Lungs: clear to auscultation bilaterally Heart: regular rate and rhythm, S1, S2 normal, no murmur, click, rub or gallop Abdomen: soft, non-tender; bowel sounds normal; no masses,  no organomegaly Pulses: 2+ and symmetric Skin: Skin color, texture, turgor normal. No rashes or lesions Lymph nodes: Cervical, supraclavicular, and axillary nodes normal.  Assessment and Plan: Chronic diastolic CHF (congestive heart failure) Stable. Patient without any signs of acute symptoms. Continue Lasix 40 mg daily with additional 40 mg  For overnight weight gain.  Report any symptoms of shortness of breath immediately. Elevate lower extremities while sitting.     Renal insufficiency She has been referred to nephrology in the past. Renal function was declining during hospitalization due to overdiuresis but is back to baseline,  Fatigue Suspect fatigue is multifactorial.Not due to anemia or worsening renal function .  Patient is under a lot of stress with her current situation with her husband. She is now sleeping better with trazodone.  Encouraged to participate in [facility] exercise classes for seniors since she is not interested in attending [facility] cardiopulmonary  rehab.   ATRIAL FIBRILLATION Rate controlled.  INR therapeutic.   Rash of neck Purpuric, right jawline and submandibular.  Etiology unclear, and it is asymptomatic.  INR is therapeutic and platelet count is normal.  I suspect secondary to contact dermatitis from some unnamed irritant that she inadvertently scratched.  Will have her keep an ey eon it and if it spreads she will need derm eval.   Pes planus (flat feet) She is having difficulty ambulating without assistancde due to CHF complicated by flat feet and is requesting a walker.   Gait instability The patient requires use of a walker in her home in order to get to the bathroom to groom  and the kitchen to eat her meal in a reasonable amount of time and to prevent falls which could be life threatening.  She is expected to need the walker for lifetime.   Updated Medication List Outpatient Encounter Prescriptions as of 08/06/2012  Medication Sig Dispense Refill  . allopurinol (ZYLOPRIM) 100 MG tablet TAKE 1 TABLET BY MOUTH EVERY DAY  30 tablet  5  . amLODipine (NORVASC) 5 MG tablet Take 1 tablet (5 mg total) by mouth daily.  30 tablet  11  . beta carotene w/minerals (OCUVITE) tablet Take 1 tablet by mouth daily.        . furosemide (LASIX) 40 MG tablet Take 1 tablet (40 mg total) by mouth 2 (two) times daily.  60 tablet  6  . KLOR-CON M20 20 MEQ tablet TAKE 1 TABLET BY MOUTH ONCE A DAY  30 tablet  1  . pramipexole (MIRAPEX) 0.25 MG tablet TAKE 1 TABLET BY MOUTH AT BEDTIME.  90 tablet  1  . propafenone (RYTHMOL) 225 MG tablet TAKE 1 TABLET 3 TIMES A DAY  270 tablet  3  . traZODone (DESYREL) 50 MG tablet Take 1/2 tablet at bedtime as needed for sleep.  30 tablet  3  . warfarin (COUMADIN) 2 MG tablet TAKE 1 TABLET BY MOUTH AS DIRECTED  90 tablet  1  . [DISCONTINUED] amLODipine (NORVASC) 10 MG tablet Take 0.5 tablets (5 mg total) by mouth daily.  30 tablet  0  . [DISCONTINUED] CRESTOR 10 MG tablet TAKE 1 TABLET (10 MG TOTAL) BY MOUTH DAILY.  30 tablet  6  . [DISCONTINUED] metoprolol succinate (TOPROL-XL) 50 MG 24 hr tablet TAKE 1 TABLET EVERY DAY  90 tablet  3   No facility-administered encounter medications on file as of 08/06/2012.

## 2012-08-08 ENCOUNTER — Encounter: Payer: Self-pay | Admitting: Internal Medicine

## 2012-08-08 DIAGNOSIS — R21 Rash and other nonspecific skin eruption: Secondary | ICD-10-CM | POA: Insufficient documentation

## 2012-08-08 NOTE — Assessment & Plan Note (Signed)
She is having difficulty ambulating without assistancde due to CHF complicated by flat feet and is requesting a walker.

## 2012-08-08 NOTE — Assessment & Plan Note (Signed)
Purpuric, right jawline and submandibular.  Etiology unclear, and it is asymptomatic.  INR is therapeutic and platelet count is normal.  I suspect secondary to contact dermatitis from some unnamed irritant that she inadvertently scratched.  Will have her keep an ey eon it and if it spreads she will need derm eval.

## 2012-08-08 NOTE — Assessment & Plan Note (Signed)
Stable. Patient without any signs of acute symptoms. Continue Lasix 40 mg daily with additional 40 mg  For overnight weight gain.  Report any symptoms of shortness of breath immediately. Elevate lower extremities while sitting.

## 2012-08-08 NOTE — Assessment & Plan Note (Addendum)
Suspect fatigue is multifactorial.Not due to anemia or worsening renal function .  Patient is under a lot of stress with her current situation with her husband. She is now sleeping better with trazodone.  Encouraged to participate in [facility] exercise classes for seniors since she is not interested in attending [facility] cardiopulmonary  rehab.

## 2012-08-08 NOTE — Assessment & Plan Note (Signed)
She has been referred to nephrology in the past. Renal function was declining during hospitalization due to overdiuresis but is back to baseline,

## 2012-08-08 NOTE — Assessment & Plan Note (Signed)
Rate controlled.  INR therapeutic.

## 2012-08-10 ENCOUNTER — Encounter: Payer: Self-pay | Admitting: *Deleted

## 2012-08-15 ENCOUNTER — Other Ambulatory Visit: Payer: Self-pay | Admitting: Internal Medicine

## 2012-08-17 ENCOUNTER — Other Ambulatory Visit: Payer: Self-pay | Admitting: Internal Medicine

## 2012-08-17 NOTE — Telephone Encounter (Signed)
Last lipid 4/13, refill?

## 2012-08-24 DIAGNOSIS — R2681 Unsteadiness on feet: Secondary | ICD-10-CM | POA: Insufficient documentation

## 2012-08-24 NOTE — Assessment & Plan Note (Signed)
The patient requires use of a walker in her home in order to get to the bathroom to groom and the kitchen to eat her meal in a reasonable amount of time and to prevent falls which could be life threatening.  She is expected to need the walker for lifetime.   

## 2012-09-08 ENCOUNTER — Telehealth: Payer: Self-pay | Admitting: *Deleted

## 2012-09-08 DIAGNOSIS — R0600 Dyspnea, unspecified: Secondary | ICD-10-CM

## 2012-09-08 NOTE — Telephone Encounter (Signed)
Please review and advise, see below.

## 2012-09-08 NOTE — Telephone Encounter (Signed)
Patient called needs an order to d/c her oxygen. [facility] [phone]

## 2012-09-10 NOTE — Telephone Encounter (Signed)
Review.

## 2012-09-10 NOTE — Telephone Encounter (Signed)
I spoke with pt about oxygen. She states her O2 sat at rest has been 98% & wear oxygen at night. Does not feel she needs to continue wearing.  She has coumadin clinic appointment next Wednesday & we can check her O2 sat with exercise for Dr. Mariah Milling to evaluate discontinuing oxygen at that time.  She agrees & understands that oxygen levels can sometimes drop with exercise. States her children are with her at this time as her husband has recently passed.  Reassurance given Mylo Red RN

## 2012-09-10 NOTE — Telephone Encounter (Signed)
Patient called back regarding oxygen and what she needed to do for [facility] to come pick it up and discontinue.

## 2012-09-11 ENCOUNTER — Other Ambulatory Visit: Payer: Self-pay | Admitting: Internal Medicine

## 2012-09-15 ENCOUNTER — Ambulatory Visit (INDEPENDENT_AMBULATORY_CARE_PROVIDER_SITE_OTHER): Payer: Medicare Other

## 2012-09-15 DIAGNOSIS — I4891 Unspecified atrial fibrillation: Secondary | ICD-10-CM

## 2012-09-15 NOTE — Telephone Encounter (Signed)
Error Debbie Pacer Dorn RN  

## 2012-09-15 NOTE — Telephone Encounter (Signed)
Pt was ambulated during coumadin clinic appointment for evaluation of discontinuing oxygen.  Resting 98%  HR 74  With activity 96 %  HR 94   Will forward to Dr. Mariah Milling for consideration in discontinuing home oxygen with [facility]. Mylo Red RN

## 2012-09-16 NOTE — Telephone Encounter (Signed)
Would be ok to d/c oxygen Usually needs oxygen only after CHF episode Looks better now

## 2012-09-17 NOTE — Telephone Encounter (Signed)
LMOVM  Mylo Red RN

## 2012-09-17 NOTE — Telephone Encounter (Signed)
Message sent to [facility] with [facility] to discontinue home oxygen.

## 2012-09-17 NOTE — Addendum Note (Signed)
Addended by: Lisabeth Devoid F on: 09/17/2012 12:17 PM   Modules accepted: Orders

## 2012-09-22 ENCOUNTER — Ambulatory Visit: Payer: Medicare Other | Admitting: Internal Medicine

## 2012-09-30 ENCOUNTER — Other Ambulatory Visit: Payer: Self-pay | Admitting: Cardiovascular Disease

## 2012-10-01 ENCOUNTER — Other Ambulatory Visit: Payer: Self-pay | Admitting: *Deleted

## 2012-10-01 MED ORDER — PROPAFENONE HCL 225 MG PO TABS: ORAL_TABLET | ORAL | Status: AC

## 2012-10-04 ENCOUNTER — Encounter: Payer: Self-pay | Admitting: Cardiovascular Disease

## 2012-10-04 ENCOUNTER — Ambulatory Visit (INDEPENDENT_AMBULATORY_CARE_PROVIDER_SITE_OTHER): Payer: Medicare Other | Admitting: Cardiovascular Disease

## 2012-10-04 VITALS — BP 110/58 | HR 64 | Ht 64.0 in | Wt 154.8 lb

## 2012-10-04 DIAGNOSIS — I359 Nonrheumatic aortic valve disorder, unspecified: Secondary | ICD-10-CM

## 2012-10-04 DIAGNOSIS — I509 Heart failure, unspecified: Secondary | ICD-10-CM

## 2012-10-04 DIAGNOSIS — I10 Essential (primary) hypertension: Secondary | ICD-10-CM

## 2012-10-04 DIAGNOSIS — I5032 Chronic diastolic (congestive) heart failure: Secondary | ICD-10-CM

## 2012-10-04 DIAGNOSIS — I4891 Unspecified atrial fibrillation: Secondary | ICD-10-CM

## 2012-10-04 DIAGNOSIS — F4320 Adjustment disorder, unspecified: Secondary | ICD-10-CM | POA: Insufficient documentation

## 2012-10-04 DIAGNOSIS — I350 Nonrheumatic aortic (valve) stenosis: Secondary | ICD-10-CM

## 2012-10-04 MED ORDER — POTASSIUM CHLORIDE 20 MEQ PO PACK: 20.0000 meq | PACK | Freq: Every day | ORAL | Status: DC

## 2012-10-04 NOTE — Progress Notes (Signed)
Patient ID: Madeline Perez, female    DOB: 29-Jan-1924, 79 y.o.   MRN: [id number]  HPI Comments: Madeline Perez is a 79 year old woman with history of paroxysmal atrial fibrillation, moderate aortic valve stenosis, chronic lower extremity edema, chronic renal insufficiency, Diastolic dysfunction, pulmonary hypertension who presents for routine followup. She recently lost her husband who had dementia, respiratory distress.  In followup today, she is still upset about the loss of her husband. She is coping well, has the support of family. Her weight has been slowly increasing in the 146-148 range now up to 152 pounds. She has only been taking Lasix 40 mg daily. Taking more fluids. Snacking more. She is trying to watch her sodium intake. She has noticed increased swelling of her ankles and legs. She is no longer needing oxygen   hospital admission for acute diastolic CHF in 11/2011 after she stopped taking her diuretic.   Echocardiogram showed normal LV function, moderately elevated right ventricular systolic pressures estimated at 50-60 mmHg, moderate aortic valve stenosis with mean gradient 31 mm of mercury, estimated aortic valve area 1.22 cm, moderate sized left pleural effusion, moderate TR  She had aggressive diuresis in the hospital with Lasix IV twice a day, transitioning to Lasix by mouth twice a day, discharged on Lasix 40 mg daily. She was on oxygen by nasal cannula while in the hospital and at discharge. This has been weaned   Readmitted  back to the hospital with shortness of breath. She was taking Lasix daily but eating a lot of TV dinners and canned foods. She gained 5 pounds and had worsening cough and wheezing so she presented to the emergency room. BNP on arrival was 5800, creatinine 1.24. This increased to 1.37 at the time of discharge She was treated with Lasix IV twice a day with significant weight drop into the 146 range. Chest x-ray in the hospital showed small bilateral pleural  effusions. She was discharged on oxygen and has weaned herself down to oxygen only at nighttime just for comfort.  EKG shows normal sinus rhythm with rate of 64 beats per minute, left anterior fascicular block, intraventricular conduction delay, poor R-wave progression through the anterior precordial leads   Outpatient Encounter Prescriptions as of 10/04/2012  Medication Sig Dispense Refill  . allopurinol (ZYLOPRIM) 100 MG tablet TAKE 1 TABLET BY MOUTH EVERY DAY  30 tablet  5  . amLODipine (NORVASC) 5 MG tablet Take 1 tablet (5 mg total) by mouth daily.  30 tablet  11  . beta carotene w/minerals (OCUVITE) tablet Take 1 tablet by mouth daily.        . CRESTOR 10 MG tablet TAKE 1 TABLET (10 MG TOTAL) BY MOUTH DAILY.  30 tablet  6  . furosemide (LASIX) 40 MG tablet Take 40 mg by mouth daily. Take an additional 1 tablet daily as needed for shortness of breath and weight gain      . metoprolol succinate (TOPROL-XL) 50 MG 24 hr tablet TAKE 1 TABLET EVERY DAY  90 tablet  3  . potassium chloride (KLOR-CON) 20 MEQ packet Take 20 mEq by mouth daily. When taking Lasix twice daily.  One packet every other day when taking Lasix once daily  30 packet  6  . pramipexole (MIRAPEX) 0.25 MG tablet TAKE 1 TABLET BY MOUTH AT BEDTIME.  90 tablet  1  . propafenone (RYTHMOL) 225 MG tablet TAKE 1 TABLET 3 TIMES A DAY  270 tablet  3  . traZODone (DESYREL) 50 MG tablet Take  1/2 tablet at bedtime as needed for sleep.  30 tablet  3  . warfarin (COUMADIN) 2 MG tablet TAKE 1 TABLET BY MOUTH AS DIRECTED  90 tablet  1     Review of Systems  Constitutional: Negative.   HENT: Negative.   Eyes: Negative.   Cardiovascular: Positive for leg swelling.  Gastrointestinal: Negative.   Musculoskeletal: Negative.        Posterior shoulder and neck pain bilaterally, worse on the left  Skin: Negative.   Neurological: Negative.   Psychiatric/Behavioral: Negative.   All other systems reviewed and are negative.    BP 110/58   Pulse 64  Ht 5\' 4"  (1.626 m)  Wt 154 lb 12 oz (70.194 kg)  BMI 26.55 kg/m2  Physical Exam  Nursing note and vitals reviewed. Constitutional: She is oriented to person, place, and time. She appears well-developed and well-nourished.  HENT:  Head: Normocephalic.  Nose: Nose normal.  Mouth/Throat: Oropharynx is clear and moist.  Eyes: Conjunctivae are normal. Pupils are equal, round, and reactive to light.  Neck: Normal range of motion. Neck supple. No JVD present.  Cardiovascular: Normal rate, regular rhythm, S1 normal, S2 normal and intact distal pulses.  Exam reveals no gallop and no friction rub.   Murmur heard.  Crescendo systolic murmur is present with a grade of 3/6  Trace to 1+ lower extremity edema  Pulmonary/Chest: Effort normal and breath sounds normal. No respiratory distress. She has no wheezes. She has no rales. She exhibits no tenderness.  Abdominal: Soft. Bowel sounds are normal. She exhibits no distension. There is no tenderness.  Musculoskeletal: Normal range of motion. She exhibits edema. She exhibits no tenderness.  Lymphadenopathy:    She has no cervical adenopathy.  Neurological: She is alert and oriented to person, place, and time. Coordination normal.  Skin: Skin is warm and dry. No rash noted. No erythema.  Psychiatric: She has a normal mood and affect. Her behavior is normal. Judgment and thought content normal.    Assessment and Plan

## 2012-10-04 NOTE — Patient Instructions (Addendum)
Please increase the lasix to twice a day for a few days to get the weight less than 150 pound Then back to one lasix Drink as little as possible  For weight more than 150, take lasix twice a day  Please call us if you have new issues that need to be addressed before your next appt.  Your physician wants you to follow-up in: 2 months.  You will receive a reminder letter in the mail two months in advance. If you don't receive a letter, please call our office to schedule the follow-up appointment.

## 2012-10-04 NOTE — Assessment & Plan Note (Signed)
Moderate aortic valve stenosis. Will monitor periodically with echocardiogram.

## 2012-10-04 NOTE — Assessment & Plan Note (Signed)
Blood pressure is well controlled on today's visit. No changes made to the medications. 

## 2012-10-04 NOTE — Assessment & Plan Note (Signed)
Recent 5 pound weight gain. She is snacking more, only taking Lasix daily. We have recommended she get her weight less than 150 pounds, take Lasix twice a day for weight greater than 150, decrease her fluid intake.

## 2012-10-04 NOTE — Assessment & Plan Note (Signed)
Recent loss of her husband. She is still having difficulty adjusting

## 2012-10-27 ENCOUNTER — Ambulatory Visit (INDEPENDENT_AMBULATORY_CARE_PROVIDER_SITE_OTHER): Payer: Medicare Other

## 2012-10-27 DIAGNOSIS — I4891 Unspecified atrial fibrillation: Secondary | ICD-10-CM

## 2012-11-08 ENCOUNTER — Ambulatory Visit: Payer: Medicare Other | Admitting: Internal Medicine

## 2012-11-08 ENCOUNTER — Ambulatory Visit: Payer: Medicare Other | Admitting: Cardiovascular Disease

## 2012-11-10 ENCOUNTER — Ambulatory Visit (INDEPENDENT_AMBULATORY_CARE_PROVIDER_SITE_OTHER): Payer: Medicare Other | Admitting: Internal Medicine

## 2012-11-10 ENCOUNTER — Encounter: Payer: Self-pay | Admitting: Internal Medicine

## 2012-11-10 VITALS — BP 118/58 | HR 97 | Temp 98.1°F | Resp 14 | Ht 64.0 in | Wt 152.8 lb

## 2012-11-10 DIAGNOSIS — R2681 Unsteadiness on feet: Secondary | ICD-10-CM

## 2012-11-10 DIAGNOSIS — Z7189 Other specified counseling: Secondary | ICD-10-CM

## 2012-11-10 DIAGNOSIS — F4320 Adjustment disorder, unspecified: Secondary | ICD-10-CM

## 2012-11-10 DIAGNOSIS — R609 Edema, unspecified: Secondary | ICD-10-CM

## 2012-11-10 DIAGNOSIS — R269 Unspecified abnormalities of gait and mobility: Secondary | ICD-10-CM

## 2012-11-10 NOTE — Progress Notes (Signed)
Patient ID: Madeline Perez, female   DOB: 01-19-1924, 79 y.o.   MRN: [id number]  Patient Active Problem List   Diagnosis Date Noted  . Adjustment disorder 10/04/2012  . Gait instability 08/24/2012  . Rash of neck 08/08/2012  . Fatigue 07/13/2012  . Chronic diastolic CHF (congestive heart failure) 04/01/2012  . Encounter for discussion of do not resuscitate order 01/22/2012  . Anxiety 10/14/2011  . Aortic valve stenosis 06/26/2011  . Pain of toe of right foot 02/16/2011  . Renal insufficiency 12/06/2010  . DYSPNEA 08/16/2009  . HYPERLIPIDEMIA-MIXED 04/20/2009  . HYPERTENSION, BENIGN 04/20/2009  . ATRIAL FIBRILLATION 04/20/2009  . EDEMA 04/20/2009    Subjective:  CC:   Chief Complaint  Patient presents with  . Follow-up    3 month    HPI:   Madeline Perez a 79 y.o. female who presents for follow up on chronic conditions including diastolic dysfunction, severe aortic stenosis, and chronic leg edema.  Her beloved husband of 52 years died in 08-10-2022 of respiratory failure after a brief hospitalization .  She was not  With him when he passed but had been down the hall with Hospice administrators. She was not prepared for his passing so swiftly.  She has had a good emotional support system however, which includes her priest, children and several widowed neighbors.   She has been weighing herself daily as directed by Dr Mariah Milling..  It has been stable except on one occasion, but she did not increased her furosemide and the weight improved.   She had an episode of chest pain that lasted 3 days and was finally relieved with tums    Past Medical History  Diagnosis Date  . Hypertension   . Hyperlipidemia   . Aortic stenosis     mild to moderate with estimated valve area of 1.2 cm in 2007  . Macular degeneration   . Chronic renal insufficiency   . CHF (congestive heart failure)   . Atrial fibrillation     Past Surgical History  Procedure Laterality Date  . Cholecystectomy   03/28/1998       The following portions of the patient's history were reviewed and updated as appropriate: Allergies, current medications, and problem list.    Review of Systems:   12 Pt  review of systems was negative except those addressed in the HPI,     History   Social History  . Marital Status: Married    Spouse Name: N/A    Number of Children: N/A  . Years of Education: N/A   Occupational History  . Not on file.   Social History Main Topics  . Smoking status: Never Smoker   . Smokeless tobacco: Never Used  . Alcohol Use: No  . Drug Use: No  . Sexual Activity: Not on file   Other Topics Concern  . Not on file   Social History Narrative  . No narrative on file    Objective:  Filed Vitals:   11/10/12 1023  BP: 118/58  Pulse: 97  Temp: 98.1 F (36.7 C)  Resp: 14     General appearance: alert, cooperative and appears stated age Ears: normal TM's and external ear canals both ears Throat: lips, mucosa, and tongue normal; teeth and gums normal Neck: no adenopathy, no carotid bruit, supple, symmetrical, trachea midline and thyroid not enlarged, symmetric, no tenderness/mass/nodules Back: symmetric, no curvature. ROM normal. No CVA tenderness. Lungs: clear to auscultation bilaterally Heart: regular rate and rhythm, S1, S2 normal, no murmur,  click, rub or gallop Abdomen: soft, non-tender; bowel sounds normal; no masses,  no organomegaly Pulses: 2+ and symmetric Skin: Skin color, texture, turgor normal. No rashes or lesions Lymph nodes: Cervical, supraclavicular, and axillary nodes normal.  Assessment and Plan:  Adjustment disorder She continues to have a difficult time following the loss of her husband. We discussed starting an SSRI but she is not interested at this time.   EDEMA Minimal today, with diastolic dysfunction and venous insufficiency..  Continue daily weights and prn furosemide dose adjustments for wt gain   Encounter for discussion of  do not resuscitate order Reviewed her desire for a natrural death and DNR order  Gait instability She has had no recent falls. And is using a cane as needed.   A total of 40 minutes was spent with patient more than half of which was spent in counseling, reviewing records from other prviders and coordination of care.   Updated Medication List Outpatient Encounter Prescriptions as of 11/10/2012  Medication Sig Dispense Refill  . allopurinol (ZYLOPRIM) 100 MG tablet TAKE 1 TABLET BY MOUTH EVERY DAY  30 tablet  5  . amLODipine (NORVASC) 5 MG tablet Take 1 tablet (5 mg total) by mouth daily.  30 tablet  11  . beta carotene w/minerals (OCUVITE) tablet Take 1 tablet by mouth daily.        . CRESTOR 10 MG tablet TAKE 1 TABLET (10 MG TOTAL) BY MOUTH DAILY.  30 tablet  6  . furosemide (LASIX) 40 MG tablet Take 40 mg by mouth daily. Take an additional 1 tablet daily as needed for shortness of breath and weight gain      . metoprolol succinate (TOPROL-XL) 50 MG 24 hr tablet TAKE 1 TABLET EVERY DAY  90 tablet  3  . potassium chloride (KLOR-CON) 20 MEQ packet Take 20 mEq by mouth daily. When taking Lasix twice daily.  One packet every other day when taking Lasix once daily  30 packet  6  . pramipexole (MIRAPEX) 0.25 MG tablet TAKE 1 TABLET BY MOUTH AT BEDTIME.  90 tablet  1  . propafenone (RYTHMOL) 225 MG tablet TAKE 1 TABLET 3 TIMES A DAY  270 tablet  3  . traZODone (DESYREL) 50 MG tablet Take 1/2 tablet at bedtime as needed for sleep.  30 tablet  3  . warfarin (COUMADIN) 2 MG tablet TAKE 1 TABLET BY MOUTH AS DIRECTED  90 tablet  1   No facility-administered encounter medications on file as of 11/10/2012.     No orders of the defined types were placed in this encounter.    No Follow-up on file.

## 2012-11-10 NOTE — Patient Instructions (Addendum)
Your chest pain was due to reflux,  Since it resolved with Tums  I'm calling in a PPI (proton pump inhibitor)   Called omeprazole to take daily in the morning    You can still use tums if needed  You can avoid reflux by doing the following:    Try not to overeat at any meal,  Avoid mint and chocolate after a meal  Do no lie down in reclner or bed within 2 hours of eating

## 2012-11-11 ENCOUNTER — Encounter: Payer: Self-pay | Admitting: Internal Medicine

## 2012-11-11 NOTE — Assessment & Plan Note (Signed)
Minimal today, with diastolic dysfunction and venous insufficiency..  Continue daily weights and prn furosemide dose adjustments for wt gain

## 2012-11-11 NOTE — Assessment & Plan Note (Signed)
Reviewed her desire for a natrural death and DNR order

## 2012-11-11 NOTE — Assessment & Plan Note (Signed)
She continues to have a difficult time following the loss of her husband. We discussed starting an SSRI but she is not interested at this time.

## 2012-11-11 NOTE — Assessment & Plan Note (Signed)
She has had no recent falls. And is using a cane as needed.

## 2012-12-06 ENCOUNTER — Encounter: Payer: Self-pay | Admitting: Cardiovascular Disease

## 2012-12-06 ENCOUNTER — Ambulatory Visit (INDEPENDENT_AMBULATORY_CARE_PROVIDER_SITE_OTHER): Payer: Medicare Other | Admitting: Cardiovascular Disease

## 2012-12-06 VITALS — BP 142/62 | HR 65 | Ht 64.0 in | Wt 153.8 lb

## 2012-12-06 DIAGNOSIS — I5032 Chronic diastolic (congestive) heart failure: Secondary | ICD-10-CM

## 2012-12-06 DIAGNOSIS — I350 Nonrheumatic aortic (valve) stenosis: Secondary | ICD-10-CM

## 2012-12-06 DIAGNOSIS — I4891 Unspecified atrial fibrillation: Secondary | ICD-10-CM

## 2012-12-06 DIAGNOSIS — I509 Heart failure, unspecified: Secondary | ICD-10-CM

## 2012-12-06 DIAGNOSIS — E785 Hyperlipidemia, unspecified: Secondary | ICD-10-CM

## 2012-12-06 DIAGNOSIS — I359 Nonrheumatic aortic valve disorder, unspecified: Secondary | ICD-10-CM

## 2012-12-06 DIAGNOSIS — I10 Essential (primary) hypertension: Secondary | ICD-10-CM

## 2012-12-06 NOTE — Patient Instructions (Signed)
You are doing well. No medication changes were made.  For weight greater than 153 pounds, take an extra lasix in the afternoon  Please call us if you have new issues that need to be addressed before your next appt.  Your physician wants you to follow-up in: 6 months.  You will receive a reminder letter in the mail two months in advance. If you don't receive a letter, please call our office to schedule the follow-up appointment.

## 2012-12-06 NOTE — Progress Notes (Signed)
Patient ID: Madeline Perez, female    DOB: May 01, 1923, 79 y.o.   MRN: [id number]  HPI Comments: Madeline Perez is a 79 year old woman with history of paroxysmal atrial fibrillation, moderate aortic valve stenosis, chronic lower extremity edema, chronic renal insufficiency, Diastolic dysfunction, pulmonary hypertension who presents for routine followup. She recently lost her husband who had dementia, respiratory distress.  In followup today, she is still upset about the loss of her husband. She is coping well, has the support of family.  Weight continues to be in the 150-151 pound range. She has only been taking Lasix 40 mg daily.She is trying to watch her sodium intake. She's not Madeline Perez, eats mostly frozen meals. She has noticed increased swelling of her ankles and legs. She is no longer needing oxygen   hospital admission for acute diastolic CHF in 11/2011 after she stopped taking her diuretic.     Echocardiogram showed normal LV function, moderately elevated right ventricular systolic pressures estimated at 50-60 mmHg, moderate aortic valve stenosis with mean gradient 31 mm of mercury, estimated aortic valve area 1.22 cm, moderate sized left pleural effusion, moderate TR   Readmitted  back to the hospital with shortness of breath. She was taking Lasix daily but eating a lot of TV dinners and canned foods.  She gained 5 pounds and had worsening cough and wheezing so she presented to the emergency room. BNP on arrival was 5800, creatinine 1.24. This increased to 1.37 at the time of discharge She was treated with Lasix IV twice a day with significant weight drop into the 146 range. Chest x-ray in the hospital showed small bilateral pleural effusions. She was discharged on oxygen and has weaned herself down to oxygen only at nighttime just for comfort.  EKG shows normal sinus rhythm with rate of 65 beats per minute, left anterior fascicular block, intraventricular conduction delay, poor R-wave  progression through the anterior precordial leads   Outpatient Encounter Prescriptions as of 12/06/2012  Medication Sig Dispense Refill  . allopurinol (ZYLOPRIM) 100 MG tablet TAKE 1 TABLET BY MOUTH EVERY DAY  30 tablet  5  . amLODipine (NORVASC) 5 MG tablet Take 1 tablet (5 mg total) by mouth daily.  30 tablet  11  . beta carotene w/minerals (OCUVITE) tablet Take 1 tablet by mouth daily.        . CRESTOR 10 MG tablet TAKE 1 TABLET (10 MG TOTAL) BY MOUTH DAILY.  30 tablet  6  . furosemide (LASIX) 40 MG tablet Take 40 mg by mouth daily. Take an additional 1 tablet daily as needed for shortness of breath and weight gain      . metoprolol succinate (TOPROL-XL) 50 MG 24 hr tablet TAKE 1 TABLET EVERY DAY  90 tablet  3  . potassium chloride (KLOR-CON) 20 MEQ packet Take 20 mEq by mouth daily. When taking Lasix twice daily.  One packet every other day when taking Lasix once daily  30 packet  6  . pramipexole (MIRAPEX) 0.25 MG tablet TAKE 1 TABLET BY MOUTH AT BEDTIME.  90 tablet  1  . propafenone (RYTHMOL) 225 MG tablet TAKE 1 TABLET 3 TIMES A DAY  270 tablet  3  . traZODone (DESYREL) 50 MG tablet Take 1/2 tablet at bedtime as needed for sleep.  30 tablet  3  . warfarin (COUMADIN) 2 MG tablet TAKE 1 TABLET BY MOUTH AS DIRECTED  90 tablet  1   No facility-administered encounter medications on file as of 12/06/2012.   \  Review of Systems  Constitutional: Negative.   HENT: Negative.   Eyes: Negative.   Gastrointestinal: Negative.   Musculoskeletal: Negative.        Posterior shoulder and neck pain bilaterally, worse on the left  Skin: Negative.   Neurological: Negative.   Psychiatric/Behavioral: Negative.   All other systems reviewed and are negative.    BP 142/62  Pulse 65  Ht 5\' 4"  (1.626 m)  Wt 153 lb 12 oz (69.741 kg)  BMI 26.38 kg/m2  Physical Exam  Nursing note and vitals reviewed. Constitutional: She is oriented to person, place, and time. She appears well-developed and  well-nourished.  HENT:  Head: Normocephalic.  Nose: Nose normal.  Mouth/Throat: Oropharynx is clear and moist.  Eyes: Conjunctivae are normal. Pupils are equal, round, and reactive to light.  Neck: Normal range of motion. Neck supple. No JVD present.  Cardiovascular: Normal rate, regular rhythm, S1 normal, S2 normal and intact distal pulses.  Exam reveals no gallop and no friction rub.   Murmur heard.  Crescendo systolic murmur is present with a grade of 3/6  Trace  lower extremity edema  Pulmonary/Chest: Effort normal and breath sounds normal. No respiratory distress. She has no wheezes. She has no rales. She exhibits no tenderness.  Abdominal: Soft. Bowel sounds are normal. She exhibits no distension. There is no tenderness.  Musculoskeletal: Normal range of motion. She exhibits edema. She exhibits no tenderness.  Lymphadenopathy:    She has no cervical adenopathy.  Neurological: She is alert and oriented to person, place, and time. Coordination normal.  Skin: Skin is warm and dry. No rash noted. No erythema.  Psychiatric: She has a normal mood and affect. Her behavior is normal. Judgment and thought content normal.    Assessment and Plan       he

## 2012-12-06 NOTE — Assessment & Plan Note (Signed)
Weight is mildly elevated but stable. We have suggested she take Lasix twice a day for weight 153 pounds or higher. Currently weight has been stable at 151 pounds for several weeks. No symptoms of shortness of breath. Minimal edema.

## 2012-12-06 NOTE — Assessment & Plan Note (Signed)
Blood pressure is well controlled on today's visit. No changes made to the medications. 

## 2012-12-06 NOTE — Assessment & Plan Note (Signed)
Cholesterol is at goal on the current lipid regimen. No changes to the medications were made.  

## 2012-12-06 NOTE — Assessment & Plan Note (Signed)
Moderate stenosis by prior echocardiogram. Medical management

## 2012-12-06 NOTE — Assessment & Plan Note (Signed)
Maintaining normal sinus rhythm 

## 2012-12-08 ENCOUNTER — Ambulatory Visit (INDEPENDENT_AMBULATORY_CARE_PROVIDER_SITE_OTHER): Payer: Medicare Other | Admitting: General Practice

## 2012-12-08 DIAGNOSIS — I4891 Unspecified atrial fibrillation: Secondary | ICD-10-CM

## 2012-12-20 ENCOUNTER — Other Ambulatory Visit: Payer: Self-pay | Admitting: Internal Medicine

## 2012-12-21 ENCOUNTER — Telehealth: Payer: Self-pay | Admitting: *Deleted

## 2012-12-21 MED ORDER — PANTOPRAZOLE SODIUM 40 MG PO TBEC: 40.0000 mg | DELAYED_RELEASE_TABLET | Freq: Every day | ORAL | Status: AC

## 2012-12-21 NOTE — Telephone Encounter (Signed)
Sent Rx for protonix to pharmacy,  take once daily in the am 30 minutes prior to food

## 2012-12-21 NOTE — Telephone Encounter (Signed)
Patient came in with AVS from last OV and stated script for omeprazole was not called in verified in mar script not there please advise of MG and i will enter if patient is to have this medication please advise.  OV 9/17

## 2012-12-22 NOTE — Telephone Encounter (Signed)
Patient notified and voiced understanding.

## 2012-12-29 ENCOUNTER — Ambulatory Visit (INDEPENDENT_AMBULATORY_CARE_PROVIDER_SITE_OTHER): Payer: Medicare Other | Admitting: General Practice

## 2012-12-29 DIAGNOSIS — I4891 Unspecified atrial fibrillation: Secondary | ICD-10-CM

## 2012-12-29 LAB — POCT INR: INR: 2.5

## 2012-12-30 ENCOUNTER — Other Ambulatory Visit: Payer: Self-pay | Admitting: Internal Medicine

## 2013-01-09 ENCOUNTER — Other Ambulatory Visit: Payer: Self-pay | Admitting: Cardiovascular Disease

## 2013-01-10 NOTE — Telephone Encounter (Signed)
Please review and refill, Thank You. 

## 2013-01-11 ENCOUNTER — Other Ambulatory Visit: Payer: Self-pay

## 2013-01-11 MED ORDER — POTASSIUM CHLORIDE CRYS ER 20 MEQ PO TBCR: EXTENDED_RELEASE_TABLET | ORAL | Status: AC

## 2013-01-11 NOTE — Telephone Encounter (Signed)
Refill sent for klor con 20 meq take one tablet daily.

## 2013-01-26 ENCOUNTER — Ambulatory Visit (INDEPENDENT_AMBULATORY_CARE_PROVIDER_SITE_OTHER): Payer: Medicare Other | Admitting: General Practice

## 2013-01-26 DIAGNOSIS — I4891 Unspecified atrial fibrillation: Secondary | ICD-10-CM

## 2013-01-26 LAB — POCT INR: INR: 3.3

## 2013-02-06 ENCOUNTER — Other Ambulatory Visit: Payer: Self-pay | Admitting: Internal Medicine

## 2013-02-07 ENCOUNTER — Encounter: Payer: Self-pay | Admitting: Internal Medicine

## 2013-02-07 ENCOUNTER — Ambulatory Visit (INDEPENDENT_AMBULATORY_CARE_PROVIDER_SITE_OTHER): Payer: Medicare Other | Admitting: Internal Medicine

## 2013-02-07 VITALS — BP 126/58 | HR 61 | Temp 98.1°F | Resp 12 | Wt 155.2 lb

## 2013-02-07 DIAGNOSIS — E559 Vitamin D deficiency, unspecified: Secondary | ICD-10-CM

## 2013-02-07 DIAGNOSIS — F4320 Adjustment disorder, unspecified: Secondary | ICD-10-CM

## 2013-02-07 DIAGNOSIS — Z79899 Other long term (current) drug therapy: Secondary | ICD-10-CM

## 2013-02-07 DIAGNOSIS — I4891 Unspecified atrial fibrillation: Secondary | ICD-10-CM

## 2013-02-07 DIAGNOSIS — R5381 Other malaise: Secondary | ICD-10-CM

## 2013-02-07 DIAGNOSIS — I10 Essential (primary) hypertension: Secondary | ICD-10-CM

## 2013-02-07 LAB — COMPREHENSIVE METABOLIC PANEL
ALT: 15 U/L (ref 0–35)
Alkaline Phosphatase: 127 U/L — ABNORMAL HIGH (ref 39–117)
BUN: 27 mg/dL — ABNORMAL HIGH (ref 6–23)
CO2: 28 mEq/L (ref 19–32)
Calcium: 9.3 mg/dL (ref 8.4–10.5)
Chloride: 100 mEq/L (ref 96–112)
Creatinine, Ser: 1.5 mg/dL — ABNORMAL HIGH (ref 0.4–1.2)
GFR: 35.45 mL/min — ABNORMAL LOW (ref >60.00)

## 2013-02-07 LAB — TSH: TSH: 2.19 u[IU]/mL (ref 0.35–5.50)

## 2013-02-07 MED ORDER — TETANUS-DIPHTH-ACELL PERTUSSIS 5-2.5-18.5 LF-MCG/0.5 IM SUSP: 0.5000 mL | Freq: Once | INTRAMUSCULAR | Status: AC

## 2013-02-07 MED ORDER — ZOSTER VACCINE LIVE 19400 UNT/0.65ML ~~LOC~~ SOLR: 0.6500 mL | Freq: Once | SUBCUTANEOUS | Status: AC

## 2013-02-07 MED ORDER — ALLOPURINOL 100 MG PO TABS: ORAL_TABLET | ORAL | Status: AC

## 2013-02-07 NOTE — Progress Notes (Signed)
Pre-visit discussion using our clinic review tool. No additional management support is needed unless otherwise documented below in the visit note.  

## 2013-02-07 NOTE — Patient Instructions (Signed)
You are due for nonfasting labs so we will check them today  I recommend reading "A Grief Observed" by Wynne Dust .  It may help you with your unresolved guilt and grief  I do recommend the Shingles and the TDaP (tetanus diptheria and acellular pertussis ) vaccines  Most pharmacies will offer them for less $$$$ than we can , as well as the Health Dept.

## 2013-02-08 ENCOUNTER — Encounter: Payer: Self-pay | Admitting: Internal Medicine

## 2013-02-08 LAB — VITAMIN D 25 HYDROXY (VIT D DEFICIENCY, FRACTURES): Vit D, 25-Hydroxy: 34 ng/mL (ref 30–89)

## 2013-02-08 NOTE — Assessment & Plan Note (Signed)
Controlled on propafenone  And Toprol.  Coumadin managed by [facility]

## 2013-02-08 NOTE — Assessment & Plan Note (Signed)
Well controlled on current regimen. Renal function stable, no changes today.  Lab Results  Component Value Date   CREATININE 1.5* 02/07/2013

## 2013-02-08 NOTE — Progress Notes (Signed)
Patient ID: Madeline Perez, female   DOB: 1923/10/13, 79 y.o.   MRN: [id number]  Patient Active Problem List   Diagnosis Date Noted  . Adjustment disorder 10/04/2012  . Gait instability 08/24/2012  . Rash of neck 08/08/2012  . Fatigue 07/13/2012  . Chronic diastolic CHF (congestive heart failure) 04/01/2012  . Encounter for discussion of do not resuscitate order 01/22/2012  . Anxiety 10/14/2011  . Aortic valve stenosis 06/26/2011  . Pain of toe of right foot 02/16/2011  . Renal insufficiency 12/06/2010  . DYSPNEA 08/16/2009  . HYPERLIPIDEMIA-MIXED 04/20/2009  . HYPERTENSION, BENIGN 04/20/2009  . ATRIAL FIBRILLATION 04/20/2009  . EDEMA 04/20/2009    Subjective:  CC:   Chief Complaint  Patient presents with  . Follow-up    3 month    HPI:   Madeline Perez a 79 y.o. female who presents for follow up on chronic conditions including CHF secondary to diastolic dysfunction, atrial fibrillation an aortic valve stenosis, hypertension and adjustment disorder.  She continues to feel guilt over not being present at her husband's bedside when he passed.  She has had difficulty  Spiritually with acdepting his passing . "I've lost my faith." She is not avoiding social contact and is getting out of the house on a daily basis to spend time with friends.  Sons and daughters are helping her manage the things her husband always did for her. Denies shortness of breath but  does have chronic fatigue. Weighs herself daily but does not like taking the extra dose of furosemide bc it makes her incontinence so much worse.  Wants to know if there is an alternative to furosemide   Past Medical History  Diagnosis Date  . Hypertension   . Hyperlipidemia   . Aortic stenosis     mild to moderate with estimated valve area of 1.2 cm in 2007  . Macular degeneration   . Chronic renal insufficiency   . CHF (congestive heart failure)   . Atrial fibrillation     Past Surgical History  Procedure Laterality  Date  . Cholecystectomy  03/28/1998       The following portions of the patient's history were reviewed and updated as appropriate: Allergies, current medications, and problem list.    Review of Systems:  Patient denies headache, fevers, unintentional weight loss, skin rash, eye pain, sinus congestion and sinus pain, sore throat, dysphagia,  hemoptysis , cough, dyspnea, wheezing, chest pain, palpitations, orthopnea, edema, abdominal pain, nausea, melena, diarrhea, constipation, flank pain, dysuria, hematuria, urinary  Frequency, nocturia, numbness, tingling, seizures,  Focal weakness, Loss of consciousness,  Tremor, insomnia, depression, anxiety, and suicidal ideation.     History   Social History  . Marital Status: Married    Spouse Name: N/A    Number of Children: N/A  . Years of Education: N/A   Occupational History  . Not on file.   Social History Main Topics  . Smoking status: Never Smoker   . Smokeless tobacco: Never Used  . Alcohol Use: No  . Drug Use: No  . Sexual Activity: Not on file   Other Topics Concern  . Not on file   Social History Narrative  . No narrative on file    Objective:  Filed Vitals:   02/07/13 1058  BP: 126/58  Pulse: 61  Temp: 98.1 F (36.7 C)  Resp: 12     General appearance: alert, cooperative and appears stated age Ears: normal TM's and external ear canals both ears Throat: lips, mucosa, and  tongue normal; teeth and gums normal Neck: no adenopathy, no carotid bruit, supple, symmetrical, trachea midline and thyroid not enlarged, symmetric, no tenderness/mass/nodules Back: symmetric, no curvature. ROM normal. No CVA tenderness. Lungs: clear to auscultation bilaterally Heart: regular rate and rhythm, S1, S2 normal, no murmur, click, rub or gallop Abdomen: soft, non-tender; bowel sounds normal; no masses,  no organomegaly Pulses: 2+ and symmetric Skin: Skin color, texture, turgor normal. No rashes or lesions Lymph nodes:  Cervical, supraclavicular, and axillary nodes normal.  Assessment and Plan:  HYPERTENSION, BENIGN Well controlled on current regimen. Renal function stable, no changes today.  Lab Results  Component Value Date   CREATININE 1.5* 02/07/2013    ATRIAL FIBRILLATION Controlled on propafenone  And Toprol.  Coumadin managed by [facility]  Adjustment disorder Majority of today's 40 minute visit was spent in counseling over unresolved guilt.  Patient is [employer] and has felt abandoned by God because he has not answered her prayer, namely to heal her heart from the anguish and loss of her husband of 61 years.  She has availed herself to grief counseling but has derived little insight from their generic ministry.  Suggested she read CS Lewis's "A Grief observed."   A total of 40 minutes was spent with patient more than half of which was spent in counseling, reviewing records from other prviders and coordination of care.  Updated Medication List Outpatient Encounter Prescriptions as of 02/07/2013  Medication Sig  . allopurinol (ZYLOPRIM) 100 MG tablet TAKE 1 TABLET BY MOUTH EVERY DAY  . amLODipine (NORVASC) 5 MG tablet Take 1 tablet (5 mg total) by mouth daily.  . beta carotene w/minerals (OCUVITE) tablet Take 1 tablet by mouth daily.    . CRESTOR 10 MG tablet TAKE 1 TABLET (10 MG TOTAL) BY MOUTH DAILY.  . furosemide (LASIX) 40 MG tablet Take 40 mg by mouth daily. Take an additional 1 tablet daily as needed for shortness of breath and weight gain  . metoprolol succinate (TOPROL-XL) 50 MG 24 hr tablet TAKE 1 TABLET EVERY DAY  . pantoprazole (PROTONIX) 40 MG tablet Take 1 tablet (40 mg total) by mouth daily.  . potassium chloride SA (KLOR-CON M20) 20 MEQ tablet TAKE 1 TABLET BY MOUTH EVERY DAY  . pramipexole (MIRAPEX) 0.25 MG tablet TAKE 1 TABLET BY MOUTH AT BEDTIME.  Marland Kitchen propafenone (RYTHMOL) 225 MG tablet TAKE 1 TABLET 3 TIMES A DAY  . traZODone (DESYREL) 50 MG tablet Take 1/2 tablet at bedtime  as needed for sleep.  Marland Kitchen warfarin (COUMADIN) 2 MG tablet TAKE 1 TABLET BY MOUTH AS DIRECTED  . [DISCONTINUED] allopurinol (ZYLOPRIM) 100 MG tablet TAKE 1 TABLET BY MOUTH EVERY DAY  . Tdap (BOOSTRIX) 5-2.5-18.5 LF-MCG/0.5 injection Inject 0.5 mLs into the muscle once.  . zoster vaccine live, PF, (ZOSTAVAX) 16109 UNT/0.65ML injection Inject 19,400 Units into the skin once.  . [DISCONTINUED] allopurinol (ZYLOPRIM) 100 MG tablet TAKE 1 TABLET BY MOUTH EVERY DAY     Orders Placed This Encounter  Procedures  . Comprehensive metabolic panel  . TSH  . Vit D  25 hydroxy (rtn osteoporosis monitoring)    No Follow-up on file.

## 2013-02-08 NOTE — Assessment & Plan Note (Signed)
Majority of today's 40 minute visit was spent in counseling over unresolved guilt.  Patient is [employer] and has felt abandoned by God because he has not answered her prayer, namely to heal her heart from the anguish and loss of her husband of 61 years.  She has availed herself to grief counseling but has derived little insight from their generic ministry.  Suggested she read CS Lewis's "A grief observed."

## 2013-02-09 ENCOUNTER — Encounter: Payer: Self-pay | Admitting: *Deleted

## 2013-02-23 ENCOUNTER — Ambulatory Visit (INDEPENDENT_AMBULATORY_CARE_PROVIDER_SITE_OTHER): Payer: Medicare Other

## 2013-02-23 DIAGNOSIS — I4891 Unspecified atrial fibrillation: Secondary | ICD-10-CM

## 2013-02-23 LAB — POCT INR: INR: 2.2

## 2013-03-02 ENCOUNTER — Ambulatory Visit (INDEPENDENT_AMBULATORY_CARE_PROVIDER_SITE_OTHER): Payer: Medicare Other | Admitting: Cardiovascular Disease

## 2013-03-02 ENCOUNTER — Telehealth: Payer: Self-pay

## 2013-03-02 ENCOUNTER — Encounter: Payer: Self-pay | Admitting: Cardiovascular Disease

## 2013-03-02 VITALS — BP 120/58 | HR 63 | Ht 64.0 in | Wt 154.8 lb

## 2013-03-02 DIAGNOSIS — I359 Nonrheumatic aortic valve disorder, unspecified: Secondary | ICD-10-CM

## 2013-03-02 DIAGNOSIS — I4891 Unspecified atrial fibrillation: Secondary | ICD-10-CM

## 2013-03-02 DIAGNOSIS — R609 Edema, unspecified: Secondary | ICD-10-CM

## 2013-03-02 DIAGNOSIS — I350 Nonrheumatic aortic (valve) stenosis: Secondary | ICD-10-CM

## 2013-03-02 NOTE — Assessment & Plan Note (Signed)
Very stable leg edema, only trace. Overall weight has been stable and she is doing well and compliant on her Lasix daily

## 2013-03-02 NOTE — Patient Instructions (Addendum)
You are doing well. Weight is stable. No medication changes were made. Continue on your current lasix dosing Goal weight 150 to 154 Double up on lasix only for weight greater than 155 pounds  We will schedule you for an echocardiogram for aortic valve stenosis  Please call us if you have new issues that need to be addressed before your next appt.  Your physician wants you to follow-up in: 3 months.  You will receive a reminder letter in the mail two months in advance. If you don't receive a letter, please call our office to schedule the follow-up appointment.

## 2013-03-02 NOTE — Telephone Encounter (Signed)
Spoke w/ pt.  She reports that her legs are swollen most of the time, but yesterday the bottom of her jeans were wet. Reports that she immediately took a lasix.  About an hour later, she noticed more moisture. She was advised by nurse at [facility] to elevate her legs and keep a towel on her leg. She applied a bandage but it has it has not seeped through.  She reports that she has gained about 2 lbs this week and is concerned about taking too much lasix. She would like for Dr. Mariah Milling to take a look at her legs.  Pt sched to see Dr. Mariah Milling today at 10:30.

## 2013-03-02 NOTE — Telephone Encounter (Signed)
Pt called and states that her right leg has "fluid coming out of it toward her shin."  Denies and Chest pain, SOB. Please call.

## 2013-03-02 NOTE — Assessment & Plan Note (Signed)
She is concerned about her aortic valve. Repeat echocardiogram will be ordered. She does have shortness of breath and fatigue

## 2013-03-02 NOTE — Progress Notes (Signed)
Patient ID: Madeline Perez, female    DOB: [birth year], 80 y.o.   MRN: [id number]  HPI Comments: Madeline Perez is a 80 year old woman with history of paroxysmal atrial fibrillation, moderate aortic valve stenosis in 2013, chronic lower extremity edema, chronic renal insufficiency, Diastolic dysfunction, pulmonary hypertension who presents for routine followup. She  lost her husband who had dementia, respiratory distress. Still having adjustment disorder  She reports having recent weeping from her lower extremity. Denies having worsening swelling or weight change. Weight in general has been 150-154 pounds for many months. She was concerned that perhaps she took too many Lasix which caused her leg to weep. Like seems to have gotten better over the past 24 hours. She has chronic fatigue. Still living in a house independently, trying to make do. She is no longer needing oxygen   hospital admission for acute diastolic CHF in 11/2011 after she stopped taking her diuretic.     Echocardiogram showed normal LV function, moderately elevated right ventricular systolic pressures estimated at 50-60 mmHg, moderate aortic valve stenosis with mean gradient 31 mm of mercury, estimated aortic valve area 1.22 cm, moderate sized left pleural effusion, moderate TR   Readmitted  back to the hospital with shortness of breath. She was taking Lasix daily but eating a lot of TV dinners and canned foods.  She gained 5 pounds and had worsening cough and wheezing so she presented to the emergency room. BNP on arrival was 5800, creatinine 1.24. This increased to 1.37 at the time of discharge She was treated with Lasix IV twice a day with significant weight drop into the 146 range. Chest x-ray in the hospital showed small bilateral pleural effusions. She was discharged on oxygen and has weaned herself down to oxygen only at nighttime just for comfort.  EKG shows normal sinus rhythm with rate of 61 beats per minute, left anterior  fascicular block, intraventricular conduction delay, poor R-wave progression through the anterior precordial leads   Outpatient Encounter Prescriptions as of 03/02/2013  Medication Sig  . allopurinol (ZYLOPRIM) 100 MG tablet TAKE 1 TABLET BY MOUTH EVERY DAY  . amLODipine (NORVASC) 5 MG tablet Take 1 tablet (5 mg total) by mouth daily.  . beta carotene w/minerals (OCUVITE) tablet Take 1 tablet by mouth daily.    . CRESTOR 10 MG tablet TAKE 1 TABLET (10 MG TOTAL) BY MOUTH DAILY.  . furosemide (LASIX) 40 MG tablet Take 40 mg by mouth daily. Take an additional 1 tablet daily as needed for shortness of breath and weight gain  . metoprolol succinate (TOPROL-XL) 50 MG 24 hr tablet TAKE 1 TABLET EVERY DAY  . pantoprazole (PROTONIX) 40 MG tablet Take 1 tablet (40 mg total) by mouth daily.  . potassium chloride SA (KLOR-CON M20) 20 MEQ tablet TAKE 1 TABLET BY MOUTH EVERY DAY  . pramipexole (MIRAPEX) 0.25 MG tablet TAKE 1 TABLET BY MOUTH AT BEDTIME.  Marland Kitchen propafenone (RYTHMOL) 225 MG tablet TAKE 1 TABLET 3 TIMES A DAY  . traZODone (DESYREL) 50 MG tablet Take 1/2 tablet at bedtime as needed for sleep.  Marland Kitchen warfarin (COUMADIN) 2 MG tablet TAKE 1 TABLET BY MOUTH AS DIRECTED  . Tdap (BOOSTRIX) 5-2.5-18.5 LF-MCG/0.5 injection Inject 0.5 mLs into the muscle once.  . zoster vaccine live, PF, (ZOSTAVAX) 53614 UNT/0.65ML injection Inject 19,400 Units into the skin once.   \  Review of Systems  Constitutional: Negative.   HENT: Negative.   Eyes: Negative.   Respiratory: Negative.   Cardiovascular: Negative.  Gastrointestinal: Negative.   Endocrine: Negative.   Musculoskeletal: Negative.   Skin: Negative.   Allergic/Immunologic: Negative.   Neurological: Negative.   Hematological: Negative.   Psychiatric/Behavioral: Negative.   All other systems reviewed and are negative.    BP 120/58  Pulse 63  Ht 5\' 4"  (1.626 m)  Wt 154 lb 12 oz (70.194 kg)  BMI 26.55 kg/m2  Physical Exam  Nursing note and  vitals reviewed. Constitutional: She is oriented to person, place, and time. She appears well-developed and well-nourished.  HENT:  Head: Normocephalic.  Nose: Nose normal.  Mouth/Throat: Oropharynx is clear and moist.  Eyes: Conjunctivae are normal. Pupils are equal, round, and reactive to light.  Neck: Normal range of motion. Neck supple. No JVD present.  Cardiovascular: Normal rate, regular rhythm, S1 normal, S2 normal and intact distal pulses.  Exam reveals no gallop and no friction rub.   Murmur heard.  Crescendo systolic murmur is present with a grade of 3/6  Trace  lower extremity edema  Pulmonary/Chest: Effort normal and breath sounds normal. No respiratory distress. She has no wheezes. She has no rales. She exhibits no tenderness.  Abdominal: Soft. Bowel sounds are normal. She exhibits no distension. There is no tenderness.  Musculoskeletal: Normal range of motion. She exhibits edema. She exhibits no tenderness.  Lymphadenopathy:    She has no cervical adenopathy.  Neurological: She is alert and oriented to person, place, and time. Coordination normal.  Skin: Skin is warm and dry. No rash noted. No erythema.  Psychiatric: She has a normal mood and affect. Her behavior is normal. Judgment and thought content normal.    Assessment and Plan       he

## 2013-03-02 NOTE — Assessment & Plan Note (Addendum)
Prior EKG appeared to be normal sinus rhythm. Continue warfarin

## 2013-03-10 ENCOUNTER — Other Ambulatory Visit (INDEPENDENT_AMBULATORY_CARE_PROVIDER_SITE_OTHER): Payer: Medicare Other

## 2013-03-10 ENCOUNTER — Other Ambulatory Visit: Payer: Self-pay

## 2013-03-10 ENCOUNTER — Other Ambulatory Visit: Payer: Self-pay | Admitting: Internal Medicine

## 2013-03-10 DIAGNOSIS — R011 Cardiac murmur, unspecified: Secondary | ICD-10-CM

## 2013-03-10 DIAGNOSIS — I359 Nonrheumatic aortic valve disorder, unspecified: Secondary | ICD-10-CM

## 2013-03-10 DIAGNOSIS — R609 Edema, unspecified: Secondary | ICD-10-CM

## 2013-03-10 DIAGNOSIS — I350 Nonrheumatic aortic (valve) stenosis: Secondary | ICD-10-CM

## 2013-03-10 DIAGNOSIS — R0602 Shortness of breath: Secondary | ICD-10-CM

## 2013-03-23 ENCOUNTER — Ambulatory Visit (INDEPENDENT_AMBULATORY_CARE_PROVIDER_SITE_OTHER): Payer: Medicare Other

## 2013-03-23 DIAGNOSIS — Z5181 Encounter for therapeutic drug level monitoring: Secondary | ICD-10-CM

## 2013-03-23 DIAGNOSIS — I4891 Unspecified atrial fibrillation: Secondary | ICD-10-CM

## 2013-03-23 LAB — POCT INR: INR: 1.7

## 2013-03-31 ENCOUNTER — Encounter: Payer: Self-pay | Admitting: Cardiovascular Disease

## 2013-03-31 ENCOUNTER — Ambulatory Visit (INDEPENDENT_AMBULATORY_CARE_PROVIDER_SITE_OTHER): Payer: Medicare Other | Admitting: Cardiovascular Disease

## 2013-03-31 VITALS — BP 140/58 | HR 63 | Ht 64.0 in | Wt 156.0 lb

## 2013-03-31 DIAGNOSIS — E785 Hyperlipidemia, unspecified: Secondary | ICD-10-CM

## 2013-03-31 DIAGNOSIS — I350 Nonrheumatic aortic (valve) stenosis: Secondary | ICD-10-CM

## 2013-03-31 DIAGNOSIS — I359 Nonrheumatic aortic valve disorder, unspecified: Secondary | ICD-10-CM

## 2013-03-31 DIAGNOSIS — I4891 Unspecified atrial fibrillation: Secondary | ICD-10-CM

## 2013-03-31 DIAGNOSIS — R609 Edema, unspecified: Secondary | ICD-10-CM

## 2013-03-31 DIAGNOSIS — I509 Heart failure, unspecified: Secondary | ICD-10-CM

## 2013-03-31 DIAGNOSIS — I10 Essential (primary) hypertension: Secondary | ICD-10-CM

## 2013-03-31 DIAGNOSIS — F4320 Adjustment disorder, unspecified: Secondary | ICD-10-CM

## 2013-03-31 DIAGNOSIS — R5383 Other fatigue: Secondary | ICD-10-CM

## 2013-03-31 DIAGNOSIS — N289 Disorder of kidney and ureter, unspecified: Secondary | ICD-10-CM

## 2013-03-31 DIAGNOSIS — I5032 Chronic diastolic (congestive) heart failure: Secondary | ICD-10-CM

## 2013-03-31 DIAGNOSIS — R5381 Other malaise: Secondary | ICD-10-CM

## 2013-03-31 NOTE — Assessment & Plan Note (Signed)
Minimal edema on today's visit. She has done very well with managing her diastolic CHF over the past several months

## 2013-03-31 NOTE — Assessment & Plan Note (Signed)
She continues to have symptoms of adjustment disorder, depression. In the past she has refused medications for depression per the patient. She does have several people calling in on her. She may benefit from medications but uncertain if she would be willing to take these.

## 2013-03-31 NOTE — Progress Notes (Signed)
Patient ID: Madeline Perez, female    DOB: 08/27/1923, 80 y.o.   MRN: [id number]  HPI Comments: Madeline Perez is a 80 year old woman with history of paroxysmal atrial fibrillation, moderate aortic valve stenosis in 2013, chronic lower extremity edema, chronic renal insufficiency, Diastolic dysfunction, pulmonary hypertension who presents for routine followup. She  lost her husband who had dementia, respiratory distress. Still with adjustment disorder  Over the past several months, diastolic CHF has been relatively stable. Weight averaging 155 pounds, taking Lasix daily, extra Lasix for any weight gain or leg edema. She presents today for followup after recent echocardiogram to evaluate her aortic valve stenosis. Peak velocity across the aortic valve has increased from 339 in May 2013 up to 420 and meters per second Mean gradient has increased from 29 up to 46 mm mercury Peak gradient has increased from 46 up to 71 mmHg She reports that she is relatively asymptomatic. No symptoms of shortness of breath, dizziness, chest tightness. She is relatively sedentary at baseline, has no get up and go. She feels that she is depressed.    hospital admission for acute diastolic CHF in 11/2011 after she stopped taking her diuretic.     Echocardiogram showed normal LV function, moderately elevated right ventricular systolic pressures estimated at 50-60 mmHg, moderate aortic valve stenosis with mean gradient 31 mm of mercury, estimated aortic valve area 1.22 cm, moderate sized left pleural effusion, moderate TR   Readmitted  back to the hospital with shortness of breath. She was taking Lasix daily but eating a lot of TV dinners and canned foods.  She gained 5 pounds and had worsening cough and wheezing so she presented to the emergency room. BNP on arrival was 5800, creatinine 1.24. This increased to 1.37 at the time of discharge She was treated with Lasix IV twice a day with significant weight drop into the 146  range. Chest x-ray in the hospital showed small bilateral pleural effusions. She was discharged on oxygen and has weaned herself down to oxygen only at nighttime just for comfort.  EKG shows normal sinus rhythm with rate of 63 beats per minute, left anterior fascicular block, intraventricular conduction delay, poor R-wave progression through the anterior precordial leads   Outpatient Encounter Prescriptions as of 03/31/2013  Medication Sig  . allopurinol (ZYLOPRIM) 100 MG tablet TAKE 1 TABLET BY MOUTH EVERY DAY  . amLODipine (NORVASC) 5 MG tablet Take 1 tablet (5 mg total) by mouth daily.  . beta carotene w/minerals (OCUVITE) tablet Take 1 tablet by mouth daily.    . CRESTOR 10 MG tablet TAKE 1 TABLET (10 MG TOTAL) BY MOUTH DAILY.  . furosemide (LASIX) 40 MG tablet Take 40 mg by mouth daily. Take an additional 1 tablet daily as needed for shortness of breath and weight gain  . metoprolol succinate (TOPROL-XL) 50 MG 24 hr tablet TAKE 1 TABLET EVERY DAY  . pantoprazole (PROTONIX) 40 MG tablet Take 1 tablet (40 mg total) by mouth daily.  . potassium chloride SA (KLOR-CON M20) 20 MEQ tablet TAKE 1 TABLET BY MOUTH EVERY DAY  . pramipexole (MIRAPEX) 0.25 MG tablet TAKE 1 TABLET BY MOUTH AT BEDTIME.  Marland Kitchen propafenone (RYTHMOL) 225 MG tablet TAKE 1 TABLET 3 TIMES A DAY  . Tdap (BOOSTRIX) 5-2.5-18.5 LF-MCG/0.5 injection Inject 0.5 mLs into the muscle once.  . traZODone (DESYREL) 50 MG tablet Take 1/2 tablet at bedtime as needed for sleep.  Marland Kitchen warfarin (COUMADIN) 2 MG tablet TAKE 1 TABLET BY MOUTH AS DIRECTED  .  zoster vaccine live, PF, (ZOSTAVAX) [id number] UNT/0.65ML injection Inject 19,400 Units into the skin once.  . [DISCONTINUED] pramipexole (MIRAPEX) 0.25 MG tablet TAKE 1 TABLET BY MOUTH AT BEDTIME.   \  Review of Systems  Constitutional: Negative.   HENT: Negative.   Eyes: Negative.   Respiratory: Negative.   Cardiovascular: Negative.   Gastrointestinal: Negative.   Endocrine: Negative.    Musculoskeletal: Negative.   Skin: Negative.   Allergic/Immunologic: Negative.   Neurological: Negative.   Hematological: Negative.   Psychiatric/Behavioral: Negative.   All other systems reviewed and are negative.    BP 140/58  Pulse 63  Ht 5\' 4"  (1.626 m)  Wt 156 lb (70.761 kg)  BMI 26.76 kg/m2  Physical Exam  Nursing note and vitals reviewed. Constitutional: She is oriented to person, place, and time. She appears well-developed and well-nourished.  HENT:  Head: Normocephalic.  Nose: Nose normal.  Mouth/Throat: Oropharynx is clear and moist.  Eyes: Conjunctivae are normal. Pupils are equal, round, and reactive to light.  Neck: Normal range of motion. Neck supple. No JVD present.  Cardiovascular: Normal rate, regular rhythm, S1 normal, S2 normal and intact distal pulses.  Exam reveals no gallop and no friction rub.   Murmur heard.  Crescendo systolic murmur is present with a grade of 3/6  Trace  lower extremity edema  Pulmonary/Chest: Effort normal and breath sounds normal. No respiratory distress. She has no wheezes. She has no rales. She exhibits no tenderness.  Abdominal: Soft. Bowel sounds are normal. She exhibits no distension. There is no tenderness.  Musculoskeletal: Normal range of motion. She exhibits edema. She exhibits no tenderness.  Lymphadenopathy:    She has no cervical adenopathy.  Neurological: She is alert and oriented to person, place, and time. Coordination normal.  Skin: Skin is warm and dry. No rash noted. No erythema.  Psychiatric: She has a normal mood and affect. Her behavior is normal. Judgment and thought content normal.    Assessment and Plan       he

## 2013-03-31 NOTE — Assessment & Plan Note (Signed)
She is maintaining normal sinus rhythm. Seems to be better when diastolic CHF is not a major issue

## 2013-03-31 NOTE — Patient Instructions (Signed)
You are doing well. No medication changes were made.  Please call us if you have new issues that need to be addressed before your next appt.  Your physician wants you to follow-up in: 6 months.  You will receive a reminder letter in the mail two months in advance. If you don't receive a letter, please call our office to schedule the follow-up appointment.   

## 2013-03-31 NOTE — Assessment & Plan Note (Signed)
Stable renal function. Creatinine 1.5

## 2013-03-31 NOTE — Assessment & Plan Note (Signed)
Cholesterol is at goal on the current lipid regimen. No changes to the medications were made.  

## 2013-03-31 NOTE — Assessment & Plan Note (Signed)
She has primary source severe aortic valve stenosis. She is asymptomatic. We have started the discussion of what aortic valve corrective surgery will entail. She is interested preliminarily in a less invasive approach. The plan is to discuss this with her in the next several months and closely watch for symptoms. I suggested that she potentially meet with Dr. Excell Seltzer and Cornelius Moras later this year. She is in agreement with this. TEE could be performed prior to their meeting.

## 2013-03-31 NOTE — Assessment & Plan Note (Signed)
Suggested she continue on Lasix 40 mg every morning, extra Lasix for any weight gain or worsening swelling, even symptoms of cough

## 2013-03-31 NOTE — Assessment & Plan Note (Signed)
Blood pressure is well controlled on today's visit. No changes made to the medications. 

## 2013-04-15 ENCOUNTER — Ambulatory Visit (INDEPENDENT_AMBULATORY_CARE_PROVIDER_SITE_OTHER): Payer: Medicare Other

## 2013-04-15 DIAGNOSIS — Z5181 Encounter for therapeutic drug level monitoring: Secondary | ICD-10-CM

## 2013-04-15 DIAGNOSIS — I4891 Unspecified atrial fibrillation: Secondary | ICD-10-CM

## 2013-04-15 LAB — POCT INR: INR: 1.5

## 2013-04-24 DIAGNOSIS — S72001A Fracture of unspecified part of neck of right femur, initial encounter for closed fracture: Secondary | ICD-10-CM

## 2013-04-24 HISTORY — DX: Fracture of unspecified part of neck of right femur, initial encounter for closed fracture: S72.001A

## 2013-04-26 ENCOUNTER — Telehealth: Payer: Self-pay | Admitting: Internal Medicine

## 2013-04-26 NOTE — Telephone Encounter (Signed)
Pt left vm asking to see Dr. Darrick Huntsman 3/4 for severe back pain.  Returned pt call, left vm for her to return our call.  No appt available.

## 2013-04-27 ENCOUNTER — Emergency Department: Payer: Self-pay | Admitting: Emergency Medicine

## 2013-04-27 LAB — URINALYSIS, COMPLETE
BILIRUBIN, UR: NEGATIVE
BLOOD: NEGATIVE
Bacteria: NONE SEEN
Glucose,UR: NEGATIVE mg/dL (ref 0–75)
Ketone: NEGATIVE
LEUKOCYTE ESTERASE: NEGATIVE
Nitrite: NEGATIVE
PH: 6 (ref 4.5–8.0)
Protein: NEGATIVE
RBC,UR: <1 /HPF (ref 0–5)
Specific Gravity: 1.006 (ref 1.003–1.030)
Squamous Epithelial: NONE SEEN
WBC UR: <1 /HPF (ref 0–5)

## 2013-04-27 NOTE — Telephone Encounter (Signed)
Patient is reporting pain below waist, pain started slowly Sunday and now has become much worse. Pain on scale of 1-10 is rated 9. Starting to run down right leg. Feels that she is not expelling enough urine.  But wears depends and does not know whether she is wetting them some before going to restroom. Because of no appoints available here with you patient is going to go tot ER. FYI

## 2013-04-28 ENCOUNTER — Telehealth: Payer: Self-pay | Admitting: Internal Medicine

## 2013-04-28 NOTE — Telephone Encounter (Signed)
Patient Information:  Caller Name: Raaga  Phone: [phone]  Patient: Madeline Perez, Madeline Perez  Gender: Female  DOB: 05/10/1923  Age: 80 Years  PCP: Duncan Dull (Adults only)  Office Follow Up:  Does the office need to follow up with this patient?: No  Instructions For The Office: N/A  RN Note:  Call transferred to office staff in order to schedule a follow-up visit.  Symptoms  Reason For Call & Symptoms: Reports back pain. Was seen in ED on 04/27/13 and diagnosed with arthritis and prescribed Oxycodone which has not helped the pain very much. Reports pain is worse on the right side and radiates down the right leg slightly. Was instructed to follow up with PCP, patient needs to schedule the appointment.  Reviewed Health History In EMR: Yes  Reviewed Medications In EMR: Yes  Reviewed Allergies In EMR: Yes  Reviewed Surgeries / Procedures: Yes  Date of Onset of Symptoms: 04/24/2013  Treatments Tried: Oxycodone  Treatments Tried Worked: No  Guideline(s) Used:  No Protocol Available - Sick Adult  Disposition Per Guideline:   Home Care  Reason For Disposition Reached:   Patient's symptoms are safe to treat at home per nursing judgment  Advice Given:  N/A  Patient Will Follow Care Advice:  YES

## 2013-04-28 NOTE — Telephone Encounter (Signed)
FYI-pt scheduled appt

## 2013-04-30 ENCOUNTER — Other Ambulatory Visit: Payer: Self-pay | Admitting: Internal Medicine

## 2013-05-02 ENCOUNTER — Inpatient Hospital Stay: Payer: Self-pay | Admitting: Internal Medicine

## 2013-05-02 ENCOUNTER — Telehealth: Payer: Self-pay | Admitting: Internal Medicine

## 2013-05-02 LAB — PRO B NATRIURETIC PEPTIDE: B-TYPE NATIURETIC PEPTID: 5030 pg/mL — AB (ref 0–450)

## 2013-05-02 LAB — CBC WITH DIFFERENTIAL/PLATELET
BASOS ABS: 0.1 10*3/uL (ref 0.0–0.1)
Basophil %: 1 %
Eosinophil #: 0.3 10*3/uL (ref 0.0–0.7)
Eosinophil %: 3.3 %
HCT: 35.8 % (ref 35.0–47.0)
HGB: 12.1 g/dL (ref 12.0–16.0)
Lymphocyte #: 1 10*3/uL (ref 1.0–3.6)
Lymphocyte %: 12.4 %
MCH: 32 pg (ref 26.0–34.0)
MCHC: 33.9 g/dL (ref 32.0–36.0)
MCV: 95 fL (ref 80–100)
MONOS PCT: 6.5 %
Monocyte #: 0.5 x10 3/mm (ref 0.2–0.9)
Neutrophil #: 6.3 10*3/uL (ref 1.4–6.5)
Neutrophil %: 76.8 %
Platelet: 195 10*3/uL (ref 150–440)
RBC: 3.78 10*6/uL — ABNORMAL LOW (ref 3.80–5.20)
RDW: 14.6 % — AB (ref 11.5–14.5)
WBC: 8.1 10*3/uL (ref 3.6–11.0)

## 2013-05-02 LAB — PROTIME-INR
INR: 2.1
Prothrombin Time: 22.7 secs — ABNORMAL HIGH (ref 11.5–14.7)

## 2013-05-02 LAB — BASIC METABOLIC PANEL
ANION GAP: 4 — AB (ref 7–16)
BUN: 39 mg/dL — ABNORMAL HIGH (ref 7–18)
CHLORIDE: 106 mmol/L (ref 98–107)
Calcium, Total: 9.4 mg/dL (ref 8.5–10.1)
Co2: 30 mmol/L (ref 21–32)
Creatinine: 1.43 mg/dL — ABNORMAL HIGH (ref 0.60–1.30)
EGFR (African American): 38 — ABNORMAL LOW
EGFR (Non-African Amer.): 33 — ABNORMAL LOW
Glucose: 87 mg/dL (ref 65–99)
Osmolality: 288 (ref 275–301)
Potassium: 4.5 mmol/L (ref 3.5–5.1)
Sodium: 140 mmol/L (ref 136–145)

## 2013-05-02 LAB — TROPONIN I: Troponin-I: <0.02 ng/mL

## 2013-05-02 NOTE — Telephone Encounter (Signed)
The patient was given 15  oxycodone 5 mg tablets in the ER on 3.4.15 for back pain . She is running out and she is needing more before her appointment with Raquel on 3.11.15. Please advise.

## 2013-05-03 DIAGNOSIS — I10 Essential (primary) hypertension: Secondary | ICD-10-CM

## 2013-05-03 DIAGNOSIS — I5033 Acute on chronic diastolic (congestive) heart failure: Secondary | ICD-10-CM

## 2013-05-03 DIAGNOSIS — I4891 Unspecified atrial fibrillation: Secondary | ICD-10-CM

## 2013-05-03 DIAGNOSIS — I359 Nonrheumatic aortic valve disorder, unspecified: Secondary | ICD-10-CM

## 2013-05-03 LAB — BASIC METABOLIC PANEL
Anion Gap: 6 — ABNORMAL LOW (ref 7–16)
BUN: 34 mg/dL — AB (ref 7–18)
CHLORIDE: 107 mmol/L (ref 98–107)
CO2: 30 mmol/L (ref 21–32)
Calcium, Total: 8.3 mg/dL — ABNORMAL LOW (ref 8.5–10.1)
Creatinine: 1.48 mg/dL — ABNORMAL HIGH (ref 0.60–1.30)
EGFR (African American): 37 — ABNORMAL LOW
EGFR (Non-African Amer.): 32 — ABNORMAL LOW
Glucose: 74 mg/dL (ref 65–99)
OSMOLALITY: 291 (ref 275–301)
Potassium: 3.8 mmol/L (ref 3.5–5.1)
Sodium: 143 mmol/L (ref 136–145)

## 2013-05-03 LAB — CBC WITH DIFFERENTIAL/PLATELET
Basophil #: 0.1 10*3/uL (ref 0.0–0.1)
Basophil %: 1.4 %
Eosinophil #: 0.3 10*3/uL (ref 0.0–0.7)
Eosinophil %: 4.4 %
HCT: 32.2 % — AB (ref 35.0–47.0)
HGB: 11.2 g/dL — ABNORMAL LOW (ref 12.0–16.0)
Lymphocyte #: 1.2 10*3/uL (ref 1.0–3.6)
Lymphocyte %: 17.7 %
MCH: 32.8 pg (ref 26.0–34.0)
MCHC: 34.7 g/dL (ref 32.0–36.0)
MCV: 94 fL (ref 80–100)
MONOS PCT: 8.5 %
Monocyte #: 0.6 x10 3/mm (ref 0.2–0.9)
Neutrophil #: 4.7 10*3/uL (ref 1.4–6.5)
Neutrophil %: 68 %
Platelet: 187 10*3/uL (ref 150–440)
RBC: 3.41 10*6/uL — AB (ref 3.80–5.20)
RDW: 15 % — AB (ref 11.5–14.5)
WBC: 6.9 10*3/uL (ref 3.6–11.0)

## 2013-05-03 LAB — PROTIME-INR
INR: 2.1
Prothrombin Time: 23.1 secs — ABNORMAL HIGH (ref 11.5–14.7)

## 2013-05-03 NOTE — Telephone Encounter (Signed)
Called patient in reference to refilling Oxycodone patient was hospitalized due to breathing difficulties.

## 2013-05-04 ENCOUNTER — Ambulatory Visit: Payer: Medicare Other | Admitting: Adult Health

## 2013-05-04 ENCOUNTER — Ambulatory Visit: Payer: Medicare Other | Admitting: Internal Medicine

## 2013-05-05 ENCOUNTER — Telehealth: Payer: Self-pay | Admitting: Internal Medicine

## 2013-05-05 ENCOUNTER — Telehealth: Payer: Self-pay

## 2013-05-05 NOTE — Telephone Encounter (Signed)
FYI

## 2013-05-05 NOTE — Telephone Encounter (Signed)
Elnita Maxwell called from [facility] to inform the physician that her patient was sent to [facility] .

## 2013-05-05 NOTE — Telephone Encounter (Signed)
Attempted to contact pt regarding discharge from [facility] on 05/04/13. Left message for pt to call back.

## 2013-05-06 ENCOUNTER — Other Ambulatory Visit: Payer: Self-pay | Admitting: Internal Medicine

## 2013-05-06 ENCOUNTER — Telehealth: Payer: Self-pay | Admitting: Internal Medicine

## 2013-05-06 MED ORDER — TRAMADOL HCL 50 MG PO TABS: 50.0000 mg | ORAL_TABLET | Freq: Three times a day (TID) | ORAL | Status: AC | PRN

## 2013-05-06 NOTE — Telephone Encounter (Signed)
In red folder for Monday.

## 2013-05-06 NOTE — Telephone Encounter (Signed)
Rx printed and signed.  

## 2013-05-06 NOTE — Telephone Encounter (Signed)
2nd attempt to contact pt regarding discharge from [facility] on 3/115/15. Left message for pt to call back.

## 2013-05-06 NOTE — Telephone Encounter (Signed)
[facility] came in to office, dropped off FL2 form to be signed.  Placed in Dr. Melina Schools box.

## 2013-05-06 NOTE — Telephone Encounter (Signed)
Faxed script along with orders signed by Dr. Dan Humphreys.

## 2013-05-06 NOTE — Telephone Encounter (Signed)
[facility] is wanting a hard copy for patients tramadol but I do not see where patient was prescribed this medication by Dr. Darrick Huntsman Please advise

## 2013-05-06 NOTE — Telephone Encounter (Signed)
Originally going to hold for Dr. Darrick Huntsman but [facility] called and stated they need this today if possible so patient can be admitted to skill nursing. Need the FL2 please advise.

## 2013-05-10 ENCOUNTER — Inpatient Hospital Stay: Payer: Self-pay | Admitting: Internal Medicine

## 2013-05-10 DIAGNOSIS — M25559 Pain in unspecified hip: Secondary | ICD-10-CM

## 2013-05-10 LAB — URINALYSIS, COMPLETE
Bacteria: NONE SEEN
Bilirubin,UR: NEGATIVE
Blood: NEGATIVE
GLUCOSE, UR: NEGATIVE mg/dL (ref 0–75)
Hyaline Cast: 3
Ketone: NEGATIVE
LEUKOCYTE ESTERASE: NEGATIVE
Nitrite: NEGATIVE
PH: 5 (ref 4.5–8.0)
PROTEIN: NEGATIVE
RBC,UR: 1 /HPF (ref 0–5)
Specific Gravity: 1.011 (ref 1.003–1.030)
Squamous Epithelial: NONE SEEN
WBC UR: 1 /HPF (ref 0–5)

## 2013-05-10 LAB — COMPREHENSIVE METABOLIC PANEL
ALBUMIN: 3.2 g/dL — AB (ref 3.4–5.0)
Alkaline Phosphatase: 152 U/L — ABNORMAL HIGH
Anion Gap: 5 — ABNORMAL LOW (ref 7–16)
BUN: 37 mg/dL — ABNORMAL HIGH (ref 7–18)
Bilirubin,Total: 0.4 mg/dL (ref 0.2–1.0)
CALCIUM: 8.6 mg/dL (ref 8.5–10.1)
Chloride: 106 mmol/L (ref 98–107)
Co2: 28 mmol/L (ref 21–32)
Creatinine: 1.72 mg/dL — ABNORMAL HIGH (ref 0.60–1.30)
GFR CALC AF AMER: 30 — AB
GFR CALC NON AF AMER: 26 — AB
Glucose: 137 mg/dL — ABNORMAL HIGH (ref 65–99)
OSMOLALITY: 288 (ref 275–301)
Potassium: 3.9 mmol/L (ref 3.5–5.1)
SGOT(AST): 18 U/L (ref 15–37)
SGPT (ALT): 16 U/L (ref 12–78)
SODIUM: 139 mmol/L (ref 136–145)
Total Protein: 7.1 g/dL (ref 6.4–8.2)

## 2013-05-10 LAB — CBC
HCT: 37.9 % (ref 35.0–47.0)
HGB: 12.2 g/dL (ref 12.0–16.0)
MCH: 30.5 pg (ref 26.0–34.0)
MCHC: 32.2 g/dL (ref 32.0–36.0)
MCV: 95 fL (ref 80–100)
Platelet: 207 10*3/uL (ref 150–440)
RBC: 3.99 10*6/uL (ref 3.80–5.20)
RDW: 14.7 % — ABNORMAL HIGH (ref 11.5–14.5)
WBC: 9.2 10*3/uL (ref 3.6–11.0)

## 2013-05-10 LAB — PROTIME-INR
INR: 2.1
Prothrombin Time: 23 secs — ABNORMAL HIGH (ref 11.5–14.7)

## 2013-05-11 ENCOUNTER — Encounter: Payer: Medicare Other | Admitting: Cardiovascular Disease

## 2013-05-11 DIAGNOSIS — S72141A Displaced intertrochanteric fracture of right femur, initial encounter for closed fracture: Secondary | ICD-10-CM | POA: Diagnosis present

## 2013-05-11 DIAGNOSIS — I5032 Chronic diastolic (congestive) heart failure: Secondary | ICD-10-CM

## 2013-05-11 DIAGNOSIS — I359 Nonrheumatic aortic valve disorder, unspecified: Secondary | ICD-10-CM

## 2013-05-11 DIAGNOSIS — Z01810 Encounter for preprocedural cardiovascular examination: Secondary | ICD-10-CM

## 2013-05-11 DIAGNOSIS — I4891 Unspecified atrial fibrillation: Secondary | ICD-10-CM

## 2013-05-11 HISTORY — DX: Displaced intertrochanteric fracture of right femur, initial encounter for closed fracture: S72.141A

## 2013-05-11 LAB — CBC WITH DIFFERENTIAL/PLATELET
Basophil #: 0 10*3/uL (ref 0.0–0.1)
Basophil %: 0.4 %
Eosinophil #: 0 10*3/uL (ref 0.0–0.7)
Eosinophil %: 0.2 %
HCT: 30.7 % — AB (ref 35.0–47.0)
HGB: 10.4 g/dL — AB (ref 12.0–16.0)
LYMPHS PCT: 9.4 %
Lymphocyte #: 1 10*3/uL (ref 1.0–3.6)
MCH: 31.9 pg (ref 26.0–34.0)
MCHC: 33.9 g/dL (ref 32.0–36.0)
MCV: 94 fL (ref 80–100)
MONOS PCT: 6.4 %
Monocyte #: 0.7 x10 3/mm (ref 0.2–0.9)
Neutrophil #: 8.8 10*3/uL — ABNORMAL HIGH (ref 1.4–6.5)
Neutrophil %: 83.6 %
PLATELETS: 189 10*3/uL (ref 150–440)
RBC: 3.26 10*6/uL — ABNORMAL LOW (ref 3.80–5.20)
RDW: 15 % — AB (ref 11.5–14.5)
WBC: 10.5 10*3/uL (ref 3.6–11.0)

## 2013-05-11 LAB — BASIC METABOLIC PANEL
Anion Gap: 4 — ABNORMAL LOW (ref 7–16)
BUN: 31 mg/dL — ABNORMAL HIGH (ref 7–18)
CREATININE: 1.41 mg/dL — AB (ref 0.60–1.30)
Calcium, Total: 8.3 mg/dL — ABNORMAL LOW (ref 8.5–10.1)
Chloride: 103 mmol/L (ref 98–107)
Co2: 29 mmol/L (ref 21–32)
EGFR (African American): 39 — ABNORMAL LOW
GFR CALC NON AF AMER: 33 — AB
GLUCOSE: 108 mg/dL — AB (ref 65–99)
Osmolality: 279 (ref 275–301)
POTASSIUM: 3.7 mmol/L (ref 3.5–5.1)
Sodium: 136 mmol/L (ref 136–145)

## 2013-05-11 LAB — PROTIME-INR
INR: 2.3
Prothrombin Time: 24.8 secs — ABNORMAL HIGH (ref 11.5–14.7)

## 2013-05-12 DIAGNOSIS — Z96649 Presence of unspecified artificial hip joint: Secondary | ICD-10-CM

## 2013-05-12 HISTORY — PX: ORIF HIP FRACTURE: SHX2125

## 2013-05-12 LAB — BASIC METABOLIC PANEL
ANION GAP: 3 — AB (ref 7–16)
BUN: 31 mg/dL — ABNORMAL HIGH (ref 7–18)
CALCIUM: 8.3 mg/dL — AB (ref 8.5–10.1)
CHLORIDE: 103 mmol/L (ref 98–107)
CO2: 29 mmol/L (ref 21–32)
Creatinine: 1.5 mg/dL — ABNORMAL HIGH (ref 0.60–1.30)
EGFR (African American): 36 — ABNORMAL LOW
GFR CALC NON AF AMER: 31 — AB
GLUCOSE: 114 mg/dL — AB (ref 65–99)
OSMOLALITY: 278 (ref 275–301)
Potassium: 3.8 mmol/L (ref 3.5–5.1)
Sodium: 135 mmol/L — ABNORMAL LOW (ref 136–145)

## 2013-05-12 LAB — PROTIME-INR
INR: 1.5
INR: 1.4
PROTHROMBIN TIME: 16.9 s — AB (ref 11.5–14.7)
Prothrombin Time: 17.4 secs — ABNORMAL HIGH (ref 11.5–14.7)

## 2013-05-13 LAB — CBC WITH DIFFERENTIAL/PLATELET
BASOS PCT: 0.1 %
Basophil #: 0 10*3/uL (ref 0.0–0.1)
Eosinophil #: 0 10*3/uL (ref 0.0–0.7)
Eosinophil %: 0 %
HCT: 27.7 % — ABNORMAL LOW (ref 35.0–47.0)
HGB: 9.2 g/dL — ABNORMAL LOW (ref 12.0–16.0)
Lymphocyte #: 0.6 10*3/uL — ABNORMAL LOW (ref 1.0–3.6)
Lymphocyte %: 4.5 %
MCH: 31.5 pg (ref 26.0–34.0)
MCHC: 33.4 g/dL (ref 32.0–36.0)
MCV: 94 fL (ref 80–100)
MONO ABS: 0.6 x10 3/mm (ref 0.2–0.9)
Monocyte %: 4.6 %
NEUTROS ABS: 12.8 10*3/uL — AB (ref 1.4–6.5)
NEUTROS PCT: 90.8 %
PLATELETS: 173 10*3/uL (ref 150–440)
RBC: 2.93 10*6/uL — AB (ref 3.80–5.20)
RDW: 14.5 % (ref 11.5–14.5)
WBC: 14.1 10*3/uL — ABNORMAL HIGH (ref 3.6–11.0)

## 2013-05-13 LAB — BASIC METABOLIC PANEL
ANION GAP: 3 — AB (ref 7–16)
BUN: 34 mg/dL — ABNORMAL HIGH (ref 7–18)
CHLORIDE: 106 mmol/L (ref 98–107)
Calcium, Total: 8.2 mg/dL — ABNORMAL LOW (ref 8.5–10.1)
Co2: 27 mmol/L (ref 21–32)
Creatinine: 1.34 mg/dL — ABNORMAL HIGH (ref 0.60–1.30)
EGFR (African American): 41 — ABNORMAL LOW
EGFR (Non-African Amer.): 36 — ABNORMAL LOW
Glucose: 139 mg/dL — ABNORMAL HIGH (ref 65–99)
Osmolality: 282 (ref 275–301)
Potassium: 4.7 mmol/L (ref 3.5–5.1)
Sodium: 136 mmol/L (ref 136–145)

## 2013-05-13 LAB — PROTIME-INR
INR: 1.5
Prothrombin Time: 17.9 secs — ABNORMAL HIGH (ref 11.5–14.7)

## 2013-05-14 LAB — TROPONIN I
Troponin-I: 0.07 ng/mL — ABNORMAL HIGH
Troponin-I: 0.13 ng/mL — ABNORMAL HIGH
Troponin-I: 0.17 ng/mL — ABNORMAL HIGH

## 2013-05-14 LAB — PROTIME-INR
INR: 1.8
Prothrombin Time: 20.6 secs — ABNORMAL HIGH (ref 11.5–14.7)

## 2013-05-14 LAB — HEMOGLOBIN
HGB: 8.1 g/dL — ABNORMAL LOW (ref 12.0–16.0)
HGB: 8.3 g/dL — ABNORMAL LOW (ref 12.0–16.0)

## 2013-05-15 DIAGNOSIS — I200 Unstable angina: Secondary | ICD-10-CM

## 2013-05-15 LAB — CK-MB: CK-MB: 2.6 ng/mL (ref 0.5–3.6)

## 2013-05-15 LAB — PROTIME-INR
INR: 2.3
Prothrombin Time: 25 secs — ABNORMAL HIGH (ref 11.5–14.7)

## 2013-05-15 LAB — TROPONIN I
TROPONIN-I: 0.24 ng/mL — AB
Troponin-I: 0.14 ng/mL — ABNORMAL HIGH
Troponin-I: 0.36 ng/mL — ABNORMAL HIGH

## 2013-05-16 LAB — CBC WITH DIFFERENTIAL/PLATELET
BASOS PCT: 0.2 %
Basophil #: 0 10*3/uL (ref 0.0–0.1)
EOS ABS: 0.2 10*3/uL (ref 0.0–0.7)
Eosinophil %: 1.8 %
HCT: 25.6 % — ABNORMAL LOW (ref 35.0–47.0)
HGB: 8.4 g/dL — ABNORMAL LOW (ref 12.0–16.0)
Lymphocyte #: 1.3 10*3/uL (ref 1.0–3.6)
Lymphocyte %: 10.3 %
MCH: 30.9 pg (ref 26.0–34.0)
MCHC: 32.7 g/dL (ref 32.0–36.0)
MCV: 95 fL (ref 80–100)
Monocyte #: 0.7 x10 3/mm (ref 0.2–0.9)
Monocyte %: 6 %
NEUTROS ABS: 10 10*3/uL — AB (ref 1.4–6.5)
Neutrophil %: 81.7 %
Platelet: 241 10*3/uL (ref 150–440)
RBC: 2.7 10*6/uL — AB (ref 3.80–5.20)
RDW: 14.8 % — ABNORMAL HIGH (ref 11.5–14.5)
WBC: 12.3 10*3/uL — ABNORMAL HIGH (ref 3.6–11.0)

## 2013-05-16 LAB — BASIC METABOLIC PANEL
ANION GAP: 4 — AB (ref 7–16)
BUN: 32 mg/dL — ABNORMAL HIGH (ref 7–18)
CALCIUM: 8.2 mg/dL — AB (ref 8.5–10.1)
Chloride: 102 mmol/L (ref 98–107)
Co2: 31 mmol/L (ref 21–32)
Creatinine: 1.27 mg/dL (ref 0.60–1.30)
EGFR (Non-African Amer.): 38 — ABNORMAL LOW
GFR CALC AF AMER: 44 — AB
Glucose: 102 mg/dL — ABNORMAL HIGH (ref 65–99)
OSMOLALITY: 281 (ref 275–301)
Potassium: 3.8 mmol/L (ref 3.5–5.1)
SODIUM: 137 mmol/L (ref 136–145)

## 2013-05-16 LAB — PROTIME-INR
INR: 2.2
Prothrombin Time: 23.8 secs — ABNORMAL HIGH (ref 11.5–14.7)

## 2013-05-17 ENCOUNTER — Other Ambulatory Visit: Payer: Self-pay | Admitting: Physician Assistant

## 2013-05-17 ENCOUNTER — Encounter: Payer: Self-pay | Admitting: Cardiovascular Disease

## 2013-05-17 ENCOUNTER — Encounter (HOSPITAL_COMMUNITY): Payer: Self-pay | Admitting: Physician Assistant

## 2013-05-17 ENCOUNTER — Encounter: Payer: Self-pay | Admitting: Physician Assistant

## 2013-05-17 ENCOUNTER — Ambulatory Visit: Payer: Medicare Other | Admitting: Internal Medicine

## 2013-05-17 DIAGNOSIS — T50995A Adverse effect of other drugs, medicaments and biological substances, initial encounter: Secondary | ICD-10-CM | POA: Diagnosis present

## 2013-05-17 DIAGNOSIS — I129 Hypertensive chronic kidney disease with stage 1 through stage 4 chronic kidney disease, or unspecified chronic kidney disease: Secondary | ICD-10-CM | POA: Diagnosis present

## 2013-05-17 DIAGNOSIS — I509 Heart failure, unspecified: Secondary | ICD-10-CM | POA: Diagnosis present

## 2013-05-17 DIAGNOSIS — M1710 Unilateral primary osteoarthritis, unspecified knee: Secondary | ICD-10-CM | POA: Diagnosis present

## 2013-05-17 DIAGNOSIS — K649 Unspecified hemorrhoids: Secondary | ICD-10-CM | POA: Diagnosis present

## 2013-05-17 DIAGNOSIS — N179 Acute kidney failure, unspecified: Secondary | ICD-10-CM | POA: Diagnosis not present

## 2013-05-17 DIAGNOSIS — I350 Nonrheumatic aortic (valve) stenosis: Secondary | ICD-10-CM

## 2013-05-17 DIAGNOSIS — I2510 Atherosclerotic heart disease of native coronary artery without angina pectoris: Secondary | ICD-10-CM

## 2013-05-17 DIAGNOSIS — Z96649 Presence of unspecified artificial hip joint: Secondary | ICD-10-CM

## 2013-05-17 DIAGNOSIS — Z7901 Long term (current) use of anticoagulants: Secondary | ICD-10-CM

## 2013-05-17 DIAGNOSIS — Z515 Encounter for palliative care: Secondary | ICD-10-CM

## 2013-05-17 DIAGNOSIS — Z833 Family history of diabetes mellitus: Secondary | ICD-10-CM

## 2013-05-17 DIAGNOSIS — Z8781 Personal history of (healed) traumatic fracture: Secondary | ICD-10-CM

## 2013-05-17 DIAGNOSIS — I959 Hypotension, unspecified: Secondary | ICD-10-CM | POA: Diagnosis not present

## 2013-05-17 DIAGNOSIS — N183 Chronic kidney disease, stage 3 unspecified: Secondary | ICD-10-CM | POA: Diagnosis present

## 2013-05-17 DIAGNOSIS — F3289 Other specified depressive episodes: Secondary | ICD-10-CM | POA: Diagnosis present

## 2013-05-17 DIAGNOSIS — IMO0002 Reserved for concepts with insufficient information to code with codable children: Secondary | ICD-10-CM

## 2013-05-17 DIAGNOSIS — E785 Hyperlipidemia, unspecified: Secondary | ICD-10-CM | POA: Diagnosis present

## 2013-05-17 DIAGNOSIS — Z9889 Other specified postprocedural states: Secondary | ICD-10-CM

## 2013-05-17 DIAGNOSIS — D62 Acute posthemorrhagic anemia: Secondary | ICD-10-CM | POA: Diagnosis present

## 2013-05-17 DIAGNOSIS — S72009D Fracture of unspecified part of neck of unspecified femur, subsequent encounter for closed fracture with routine healing: Secondary | ICD-10-CM

## 2013-05-17 DIAGNOSIS — S72141A Displaced intertrochanteric fracture of right femur, initial encounter for closed fracture: Secondary | ICD-10-CM | POA: Diagnosis present

## 2013-05-17 DIAGNOSIS — M4856XA Collapsed vertebra, not elsewhere classified, lumbar region, initial encounter for fracture: Secondary | ICD-10-CM | POA: Diagnosis present

## 2013-05-17 DIAGNOSIS — I214 Non-ST elevation (NSTEMI) myocardial infarction: Secondary | ICD-10-CM

## 2013-05-17 DIAGNOSIS — I4891 Unspecified atrial fibrillation: Secondary | ICD-10-CM | POA: Diagnosis present

## 2013-05-17 DIAGNOSIS — N189 Chronic kidney disease, unspecified: Secondary | ICD-10-CM | POA: Diagnosis present

## 2013-05-17 DIAGNOSIS — Z8249 Family history of ischemic heart disease and other diseases of the circulatory system: Secondary | ICD-10-CM

## 2013-05-17 DIAGNOSIS — F329 Major depressive disorder, single episode, unspecified: Secondary | ICD-10-CM | POA: Diagnosis present

## 2013-05-17 DIAGNOSIS — M8448XA Pathological fracture, other site, initial encounter for fracture: Secondary | ICD-10-CM

## 2013-05-17 DIAGNOSIS — K5900 Constipation, unspecified: Secondary | ICD-10-CM | POA: Diagnosis not present

## 2013-05-17 DIAGNOSIS — I5032 Chronic diastolic (congestive) heart failure: Secondary | ICD-10-CM | POA: Diagnosis present

## 2013-05-17 DIAGNOSIS — I359 Nonrheumatic aortic valve disorder, unspecified: Principal | ICD-10-CM | POA: Diagnosis present

## 2013-05-17 DIAGNOSIS — I428 Other cardiomyopathies: Secondary | ICD-10-CM | POA: Diagnosis present

## 2013-05-17 DIAGNOSIS — Z66 Do not resuscitate: Secondary | ICD-10-CM | POA: Diagnosis not present

## 2013-05-17 HISTORY — DX: Displaced intertrochanteric fracture of right femur, initial encounter for closed fracture: S72.141A

## 2013-05-17 HISTORY — DX: Gout, unspecified: M10.9

## 2013-05-17 HISTORY — DX: Collapsed vertebra, not elsewhere classified, lumbar region, initial encounter for fracture: M48.56XA

## 2013-05-17 HISTORY — PX: CARDIAC CATHETERIZATION: SHX172

## 2013-05-17 HISTORY — DX: Wedge compression fracture of unspecified lumbar vertebra, initial encounter for closed fracture: S32.000A

## 2013-05-17 HISTORY — DX: Atherosclerotic heart disease of native coronary artery without angina pectoris: I25.10

## 2013-05-17 HISTORY — DX: Unspecified osteoarthritis, unspecified site: M19.90

## 2013-05-17 HISTORY — DX: Cardiac murmur, unspecified: R01.1

## 2013-05-17 HISTORY — DX: Pneumonia, unspecified organism: J18.9

## 2013-05-17 LAB — URINALYSIS, ROUTINE W REFLEX MICROSCOPIC
BILIRUBIN URINE: NEGATIVE
Glucose, UA: NEGATIVE mg/dL
HGB URINE DIPSTICK: NEGATIVE
KETONES UR: NEGATIVE mg/dL
Leukocytes, UA: NEGATIVE
NITRITE: NEGATIVE
Protein, ur: NEGATIVE mg/dL
SPECIFIC GRAVITY, URINE: 1.01 (ref 1.005–1.030)
UROBILINOGEN UA: 0.2 mg/dL (ref 0.0–1.0)
pH: 5 (ref 5.0–8.0)

## 2013-05-17 LAB — PROTIME-INR
INR: 1.5
PROTHROMBIN TIME: 17.4 s — AB (ref 11.5–14.7)

## 2013-05-17 LAB — MAGNESIUM: MAGNESIUM: 1.9 mg/dL (ref 1.5–2.5)

## 2013-05-17 MED ORDER — ASPIRIN EC 325 MG PO TBEC
325.0000 mg | DELAYED_RELEASE_TABLET | Freq: Every day | ORAL | Status: DC
  Administered 2013-05-18 – 2013-05-22 (×5): 325 mg via ORAL
  Filled 2013-05-17 (×6): qty 1

## 2013-05-17 MED ORDER — PRAMIPEXOLE DIHYDROCHLORIDE 0.25 MG PO TABS
0.2500 mg | ORAL_TABLET | Freq: Every day | ORAL | Status: DC
  Administered 2013-05-17 – 2013-05-20 (×4): 0.25 mg via ORAL
  Filled 2013-05-17 (×4): qty 1

## 2013-05-17 MED ORDER — ATORVASTATIN CALCIUM 20 MG PO TABS
20.0000 mg | ORAL_TABLET | Freq: Every day | ORAL | Status: DC
  Administered 2013-05-18 – 2013-05-22 (×5): 20 mg via ORAL
  Filled 2013-05-17 (×6): qty 1

## 2013-05-17 MED ORDER — SODIUM CHLORIDE 0.9 % IV SOLN: 250.0000 mL | INTRAVENOUS | Status: DC | PRN

## 2013-05-17 MED ORDER — SODIUM CHLORIDE 0.9 % IJ SOLN: 3.0000 mL | INTRAMUSCULAR | Status: DC | PRN

## 2013-05-17 MED ORDER — ONDANSETRON HCL 4 MG/2ML IJ SOLN
4.0000 mg | Freq: Four times a day (QID) | INTRAMUSCULAR | Status: DC | PRN
  Administered 2013-05-18 – 2013-05-20 (×5): 4 mg via INTRAVENOUS
  Filled 2013-05-17 (×5): qty 2

## 2013-05-17 MED ORDER — SODIUM CHLORIDE 0.9 % IJ SOLN
3.0000 mL | Freq: Two times a day (BID) | INTRAMUSCULAR | Status: DC
  Administered 2013-05-19 – 2013-05-23 (×4): 3 mL via INTRAVENOUS

## 2013-05-17 MED ORDER — HYDROCODONE-ACETAMINOPHEN 5-325 MG PO TABS
1.0000 | ORAL_TABLET | Freq: Four times a day (QID) | ORAL | Status: DC | PRN
  Administered 2013-05-17 – 2013-05-23 (×6): 1 via ORAL
  Filled 2013-05-17 (×7): qty 1

## 2013-05-17 MED ORDER — FUROSEMIDE 40 MG PO TABS
40.0000 mg | ORAL_TABLET | Freq: Every day | ORAL | Status: DC
  Administered 2013-05-18: 40 mg via ORAL
  Filled 2013-05-17 (×3): qty 1

## 2013-05-17 MED ORDER — HEPARIN (PORCINE) IN NACL 100-0.45 UNIT/ML-% IJ SOLN
950.0000 [IU]/h | INTRAMUSCULAR | Status: DC
  Administered 2013-05-17 – 2013-05-18 (×2): 950 [IU]/h via INTRAVENOUS
  Filled 2013-05-17 (×3): qty 250

## 2013-05-17 MED ORDER — FERROUS SULFATE 325 (65 FE) MG PO TABS
325.0000 mg | ORAL_TABLET | Freq: Two times a day (BID) | ORAL | Status: DC
  Administered 2013-05-18 (×2): 325 mg via ORAL
  Filled 2013-05-17 (×5): qty 1

## 2013-05-17 MED ORDER — PROPAFENONE HCL 225 MG PO TABS
225.0000 mg | ORAL_TABLET | Freq: Three times a day (TID) | ORAL | Status: DC
  Administered 2013-05-18 – 2013-05-23 (×15): 225 mg via ORAL
  Filled 2013-05-17 (×20): qty 1

## 2013-05-17 MED ORDER — LACTULOSE 10 GM/15ML PO SOLN
20.0000 g | Freq: Two times a day (BID) | ORAL | Status: DC | PRN
  Administered 2013-05-18: 20 g via ORAL
  Filled 2013-05-17 (×3): qty 30

## 2013-05-17 MED ORDER — NITROGLYCERIN 0.4 MG SL SUBL
0.4000 mg | SUBLINGUAL_TABLET | SUBLINGUAL | Status: DC | PRN
  Administered 2013-05-18: 0.4 mg via SUBLINGUAL
  Filled 2013-05-17: qty 1

## 2013-05-17 MED ORDER — POTASSIUM CHLORIDE CRYS ER 20 MEQ PO TBCR
20.0000 meq | EXTENDED_RELEASE_TABLET | Freq: Every day | ORAL | Status: DC
  Administered 2013-05-18: 20 meq via ORAL
  Filled 2013-05-17 (×3): qty 1

## 2013-05-17 MED ORDER — METOPROLOL SUCCINATE ER 50 MG PO TB24
50.0000 mg | ORAL_TABLET | Freq: Every day | ORAL | Status: DC
  Administered 2013-05-19 – 2013-05-23 (×5): 50 mg via ORAL
  Filled 2013-05-17 (×7): qty 1

## 2013-05-17 MED ORDER — ACETAMINOPHEN 325 MG PO TABS
650.0000 mg | ORAL_TABLET | ORAL | Status: DC | PRN
  Administered 2013-05-17: 650 mg via ORAL
  Filled 2013-05-17: qty 2

## 2013-05-17 MED ORDER — DOCUSATE SODIUM 100 MG PO CAPS: 200.0000 mg | ORAL_CAPSULE | Freq: Every day | ORAL | Status: DC | PRN

## 2013-05-17 MED ORDER — SODIUM CHLORIDE 0.9 % IV BOLUS (SEPSIS)
250.0000 mL | Freq: Once | INTRAVENOUS | Status: AC
  Administered 2013-05-17: 250 mL via INTRAVENOUS

## 2013-05-17 NOTE — H&P (Signed)
History and Physical   Patient ID: Madeline Perez MRN: [id number], DOB/AGE: [birth year]   Admit date: 06-09-13 Date of Consult: 06-09-2013   Primary Physician: Duncan Dull, MD Primary Cardiologist: Concha Se, MD  HPI: Madeline Perez is a 80 y.o. female w/ PMHx s/f HFpEF, severe AS, PAF, chronic Coumadin anticoagulation, CKD (stage III; baseline Cr 1.3-1.4), HTN, HLD and chronic LE edema who was admitted to [facility] 05/11/2013 w/ mechanical fall and resultant R hip fracture.   Cardiology was initially consulted for pre-op eval in this setting.  She is well known to [place]. Last seen in follow-up 03/31/13. Euvolemic weight ~151-154 lbs. CHF well-compensated at that time. Maintaining NSR. Majority of visit dedicated to discussing AV replacement after echo in 02/2013 revealed AS had advanced from moderate in 2013 to severe. Echo 02/2013- EF 60-65%, mod concentric LVH, grade 2 diastolic dysfunction, severe AS- Valve area: 0.86cm^2(VTI), Valve area: 0.91cm^2 (Vmax), mild AI, mild MR, mild LAE. She expressed an interest in TAVR. Plan was for careful symptom monitoring and referral to Drs. Excell Seltzer and [place] later this year. Again, well-compensated that visit.   She had been doing well aside from the occasional admission for CHF decompensation due to salt indiscretion and aversion to Lasix due to urinary incontinence. She was attempting to move a pillow from underneath her at her rehab facility when she lost her balance, stumbled backwards and fell on her R side. She experienced considerable R hip pain and was subsequently [facility] ED.   Radiographs revealed a R intertrochanteric fracture and ortho was consulted. ORIF was recommended and Coumadin was held.   She denied ischemic, arrhythmic or CHF type symptoms on evaluation. Objectively, she was determined to be euvolemic and well-compensated from a CHF standpoint. She was deemed to be an acceptable surgical risk. She underwent R ORIF without  complication.  On POD #2, she began to experience intermittent substernal chest pain relieved w/ morphine IV. Serial troponins revealed a mild up-trend (0.07->0.13->0.17->0.14->0.24->0.36). Coumadin had been restarted, but was held and ACS dose Lovenox was started. She underwent LHC on 2013/06/09 after requiring FFP to achieve an INR of 1.5.   Angiographic data is outlined below. This was significant for 3 vessel CAD. LV-gram was not performed due to a STEMI patient requiring urgent intervention. Given her background of severe AS, the recommendation was made to transfer to [facility] for further TCTS evaluation +/- surgery.   On tx, she was on ASA 325mg , BB, statin, NTG SL PRN. Heparin infusion w/o bolus will be restarted on arrival. All other inpatient orders have been carried over from [facility]   Problem List: Past Medical History  Diagnosis Date  . Hypertension   . Hyperlipidemia   . Severe aortic stenosis     a. 02/2013 echo: preserved EF, moderate concentric LVH, grade 2 diastolic dysfunction, severe AS- Valve area: 0.86cm^2(VTI), Valve area: 0.91cm^2 (Vmax), mild AI, mild MR, mild LAE  . CAD (coronary artery disease)     Severe 3-vessel on 04/2013 cath  . CKD (chronic kidney disease), stage III   . Chronic diastolic CHF (congestive heart failure)   . PAF (paroxysmal atrial fibrillation)   . Anticoagulated on Coumadin   . Hip fracture, right 04/2013    Mechanical fall s/p ORIF  . Heart murmur   . Pneumonia     "once; a few years ago" (2013-06-09)  . Arthritis     "knees" (09-Jun-2013)  . Gout     "for a short time; it went away" (05/18/2103)  .  Lumbar compression fracture     "recently dx'd (05/24/2013)    Past Surgical History  Procedure Laterality Date  . Cholecystectomy  03/28/1998  . Orif hip fracture Right 05/12/2013  . Cardiac catheterization  05/21/2013    80% prox LAD, 80% ostial D1, 80% prox OM2, 70% prox OM3, 60% ostial RCA, 90% and 75% prox RCA disease  . Tonsillectomy        Allergies: No Known Allergies  Inpatient Medications:   Prescriptions prior to admission  Medication Sig Dispense Refill  . acetaminophen (TYLENOL) 500 MG tablet Take 1,000 mg by mouth 4 (four) times daily as needed (pain).      Marland Kitchen allopurinol (ZYLOPRIM) 100 MG tablet Take 100 mg by mouth daily.      Marland Kitchen amLODipine (NORVASC) 5 MG tablet Take 5 mg by mouth daily.      . furosemide (LASIX) 40 MG tablet Take 40 mg by mouth See admin instructions. Take 1 tablet (40 mg) daily, if weight goes above 155 lbs take an extra tablet      . metoprolol succinate (TOPROL-XL) 50 MG 24 hr tablet Take 50 mg by mouth daily. Take with or immediately following a meal.      . Multiple Vitamins-Minerals (OCUVITE PO) Take 1 tablet by mouth daily.      Marland Kitchen oxycodone (OXY-IR) 5 MG capsule Take 5 mg by mouth every 6 (six) hours as needed for pain.      . potassium chloride SA (K-DUR,KLOR-CON) 20 MEQ tablet Take 10 mEq by mouth daily.      . pramipexole (MIRAPEX) 0.25 MG tablet Take 0.25 mg by mouth at bedtime.      . propafenone (RYTHMOL) 225 MG tablet Take 225 mg by mouth 3 (three) times daily.      . rosuvastatin (CRESTOR) 10 MG tablet Take 10 mg by mouth at bedtime.      . traZODone (DESYREL) 50 MG tablet Take 50 mg by mouth at bedtime as needed for sleep.      Marland Kitchen warfarin (COUMADIN) 2 MG tablet Take 2 mg by mouth at bedtime.        Family History  Problem Relation Age of Onset  . CAD Mother   . CAD Father   . CAD Brother   . Diabetes Mother     History   Social History  . Marital Status: Widowed    Spouse Name: N/A    Number of Children: N/A  . Years of Education: N/A   Occupational History  . Not on file.   Social History Main Topics  . Smoking status: Never Smoker   . Smokeless tobacco: Never Used  . Alcohol Use: No  . Drug Use: No  . Sexual Activity: Not on file   Other Topics Concern  . Not on file   Social History Narrative  . No narrative on file    Review of Systems: General:   negative for chills, fever, night sweats or weight changes.  Cardiovascular: +++ for chest pain, dyspnea on exertion, no edema, orthopnea, palpitations, paroxysmal nocturnal dyspnea Dermatological:  negative for rash Respiratory:  negative for cough or wheezing Urologic:  negative for hematuria Abdominal:  negative for nausea, vomiting, diarrhea, bright red blood per rectum, melena, or hematemesis Neurologic:  negative for visual changes, syncope, or dizziness All other systems reviewed and are otherwise negative except as noted above.  Physical Exam: Blood pressure 90/44, pulse 78, temperature 98.7 F (37.1 C), temperature source Oral, resp. rate 18, height  5\' 4"  (1.626 m), weight 153 lb 9.6 oz (69.673 kg), SpO2 97.00%.   General:  Well developed, well nourished, in no acute distress. Head:  Normocephalic, atraumatic, sclera non-icteric, no xanthomas, nares are without discharge.  Neck:  Negative for carotid bruits. JVD not elevated. Lungs:  Clear bilaterally to auscultation without wheezes, rales, or rhonchi. Breathing is unlabored. Heart:  RRR with S1 S2. 3/6 sem loudest @ rusb.  No rubs, or gallops appreciated. Abdomen:  Soft, non-tender, non-distended with normoactive bowel sounds. No hepatomegaly. No rebound/guarding. No obvious abdominal masses. Msk:   Strength and tone appears normal for age. R hip dsg d/i. Extremities:  No clubbing, cyanosis or edema.  Distal pedal pulses are 2+ and equal bilaterally. Neuro:  Alert and oriented X 3. Moves all extremities spontaneously. Psych:   Responds to questions appropriately with a normal affect.  Labs:  [facility]  Radiology/Studies: No results found.  ASSESSMENT AND PLAN:   1. Severe AS and NSTEMI/CAD- as above, severe multivessel CAD.  TCTS to review candidacy for CABG + AVR vs PCI of RCA and TAVR among other option Case discussed with Dr. Copper -- Continue full-dose ASA, BB, statin, NTG SL PRN -- Continue heparin infusion  without bolus  2. Chronic diastolic CHF in the setting of AS- well-compensated at this time. Euvolemic on exam.  -- Continue Lasix 40mg  PO daily, KDur 20mEq PO daily -- Monitor daily weights and I/Os peri-operatively (if elected)  4. PAF- maintaining NSR on propafenone. Coumadin held in the setting of cath today. To resume heparin.  -- Continue propafenone  -- Hold Coumadin, continue heparin  5. R hip fracture s/p ORIF 05/12/2013- tolerated procedure well and without complications. [facility] med[facility] continued on transfer.  -- Norco 5/325 PO PRN for pain control -- Will likely require ortho input here.  PT consult after TCTS decisions.  6. CKD, stage III- stable.  -- Continue to monitor w/ daily BMETs  7. Post-op ABL anemia- Hgb stable at 8.0 x 2-3 days.  -- Continue to monitor while inpatient -- Will check CBC tomorrow AM   Signed, Nicolasa Duckinghristopher Berge, NP  2014/02/14, 7:42 PM

## 2013-05-17 NOTE — Progress Notes (Signed)
PA paged and aware of pt's arrival on unit. PA also made aware of Pt's current blood pressure. Will continue to monitor.

## 2013-05-17 NOTE — Progress Notes (Signed)
Paged M.D. On call patient has fever 101 and B.P. 89/48 with i.v. Fluids N.S. At 50 ml/ hr r.n. Aware and in room.

## 2013-05-17 NOTE — Progress Notes (Signed)
ANTICOAGULATION CONSULT NOTE - Initial Consult  Pharmacy Consult for Heparin Indication: CAD awaiting CABG consult  Allergies not on file  Patient Measurements: Height: 5\' 4"  (162.6 cm) IBW/kg (Calculated) : 54.7 Heparin Dosing Weight: 70 kg  Vital Signs: Temp: 98.7 F (37.1 C) (03/24 1730) Temp src: Oral (03/24 1730) BP: 90/44 mmHg (03/24 1825) Pulse Rate: 78 (03/24 1730)  Labs: No results found for this basename: HGB, HCT, PLT, APTT, LABPROT, INR, HEPARINUNFRC, CREATININE, CKTOTAL, CKMB, TROPONINI,  in the last 72 hours  CrCl is unknown because no creatinine reading has been taken.   Medical History: Past Medical History  Diagnosis Date  . Hypertension   . Hyperlipidemia   . Severe aortic stenosis     a. 02/2013 echo: preserved EF, moderate concentric LVH, grade 2 diastolic dysfunction, severe AS- Valve area: 0.86cm^2(VTI), Valve area: 0.91cm^2 (Vmax), mild AI, mild MR, mild LAE  . CAD (coronary artery disease)     Severe 3-vessel on 04/2013 cath  . CKD (chronic kidney disease), stage III   . Chronic diastolic CHF (congestive heart failure)   . PAF (paroxysmal atrial fibrillation)   . Anticoagulated on Coumadin   . Hip fracture, right 04/2013    Mechanical fall s/p ORIF  . Heart murmur   . Pneumonia     "once; a few years ago" (2013/05/21)  . Arthritis     "knees" (May 21, 2013)  . Gout     "for a short time; it went away" (05/18/2103)  . Lumbar compression fracture     "recently dx'd (05-21-13)    Medications:  Scheduled:  . [START ON 05/18/2013] aspirin EC  325 mg Oral Daily  . [START ON 05/18/2013] atorvastatin  20 mg Oral q1800  . [START ON 05/18/2013] ferrous sulfate  325 mg Oral BID WC  . furosemide  40 mg Oral Daily  . metoprolol succinate  50 mg Oral Daily  . potassium chloride  20 mEq Oral Daily  . pramipexole  0.25 mg Oral Daily  . propafenone  225 mg Oral 3 times per day  . sodium chloride  250 mL Intravenous Once  . sodium chloride  3 mL Intravenous  Q12H    Assessment: 80 y.o. female w/ PMHx s/f HFpEF, severe AS, PAF, chronic Coumadin anticoagulation, CKD (stage III; baseline Cr 1.3-1.4), HTN, HLD and chronic LE edema who was admitted to [facility] 05/11/2013 w/ mechanical fall and resultant R hip fracture. S/p ORIF 3/19 developed chest pain, and underwent LHC May 21, 2013.  INR was 1.5 after receiving FFP.  Found with 3V CAD and given severe AS was txf to [facility] for TCTS evaluation.  Coumadin on hold for now.  Was receiving full dose Lovenox (70 mg q 24) prior to cath at [facility], it appears last dose was sometime 3/23 prior to 10 AM, but records not clear.  From [facility]: 3/23 H/H 8.4/25.6; Pltc 241 3/23 Scr 1.27 3/24 INR 1.5  Goal of Therapy:  Heparin level 0.3-0.7 units/ml Monitor platelets by anticoagulation protocol: Yes   Plan:  1. Begin IV heparin with no bolus at rate of 950 units/hr. 2. Check heparin level 8 hrs after gtt starts. 3. Daily heparin level and CBC. 4. F/u plans for surgery vs. Resuming Coumadin.  Tad Moore, BCPS  Clinical Pharmacist Pager [phone]  May 21, 2013 7:11 PM

## 2013-05-18 ENCOUNTER — Inpatient Hospital Stay (HOSPITAL_COMMUNITY): Payer: Medicare Other

## 2013-05-18 ENCOUNTER — Other Ambulatory Visit: Payer: Self-pay

## 2013-05-18 ENCOUNTER — Encounter (HOSPITAL_COMMUNITY): Payer: Self-pay | Admitting: Thoracic Surgery (Cardiothoracic Vascular Surgery)

## 2013-05-18 DIAGNOSIS — I4891 Unspecified atrial fibrillation: Secondary | ICD-10-CM

## 2013-05-18 DIAGNOSIS — I2510 Atherosclerotic heart disease of native coronary artery without angina pectoris: Secondary | ICD-10-CM

## 2013-05-18 DIAGNOSIS — I359 Nonrheumatic aortic valve disorder, unspecified: Principal | ICD-10-CM

## 2013-05-18 DIAGNOSIS — N189 Chronic kidney disease, unspecified: Secondary | ICD-10-CM | POA: Diagnosis present

## 2013-05-18 DIAGNOSIS — I350 Nonrheumatic aortic (valve) stenosis: Secondary | ICD-10-CM | POA: Diagnosis present

## 2013-05-18 DIAGNOSIS — M4856XA Collapsed vertebra, not elsewhere classified, lumbar region, initial encounter for fracture: Secondary | ICD-10-CM | POA: Diagnosis present

## 2013-05-18 DIAGNOSIS — I509 Heart failure, unspecified: Secondary | ICD-10-CM

## 2013-05-18 DIAGNOSIS — I5032 Chronic diastolic (congestive) heart failure: Secondary | ICD-10-CM

## 2013-05-18 LAB — CBC
HEMATOCRIT: 22.7 % — AB (ref 36.0–46.0)
Hemoglobin: 7.6 g/dL — ABNORMAL LOW (ref 12.0–15.0)
MCH: 31.1 pg (ref 26.0–34.0)
MCHC: 33.5 g/dL (ref 30.0–36.0)
MCV: 93 fL (ref 78.0–100.0)
Platelets: 221 10*3/uL (ref 150–400)
RBC: 2.44 MIL/uL — ABNORMAL LOW (ref 3.87–5.11)
RDW: 14.2 % (ref 11.5–15.5)
WBC: 13.7 10*3/uL — ABNORMAL HIGH (ref 4.0–10.5)

## 2013-05-18 LAB — BASIC METABOLIC PANEL
BUN: 38 mg/dL — ABNORMAL HIGH (ref 6–23)
CALCIUM: 8.2 mg/dL — AB (ref 8.4–10.5)
CO2: 25 mEq/L (ref 19–32)
CREATININE: 1.43 mg/dL — AB (ref 0.50–1.10)
Chloride: 96 mEq/L (ref 96–112)
GFR calc Af Amer: 37 mL/min — ABNORMAL LOW (ref >90)
GFR, EST NON AFRICAN AMERICAN: 32 mL/min — AB (ref >90)
Glucose, Bld: 122 mg/dL — ABNORMAL HIGH (ref 70–99)
Potassium: 4.4 mEq/L (ref 3.7–5.3)
Sodium: 135 mEq/L — ABNORMAL LOW (ref 137–147)

## 2013-05-18 LAB — LIPID PANEL
CHOL/HDL RATIO: 3 ratio
Cholesterol: 124 mg/dL (ref 0–200)
HDL: 41 mg/dL (ref >39)
LDL Cholesterol: 64 mg/dL (ref 0–99)
Triglycerides: 96 mg/dL (ref <150)
VLDL: 19 mg/dL (ref 0–40)

## 2013-05-18 LAB — PROTIME-INR
INR: 1.2 (ref 0.00–1.49)
Prothrombin Time: 14.9 seconds (ref 11.6–15.2)

## 2013-05-18 LAB — HEPARIN LEVEL (UNFRACTIONATED)
HEPARIN UNFRACTIONATED: 0.27 [IU]/mL — AB (ref 0.30–0.70)
HEPARIN UNFRACTIONATED: 0.39 [IU]/mL (ref 0.30–0.70)

## 2013-05-18 LAB — TSH: TSH: 0.818 u[IU]/mL (ref 0.350–4.500)

## 2013-05-18 LAB — T4, FREE: FREE T4: 1.37 ng/dL (ref 0.80–1.80)

## 2013-05-18 LAB — PREPARE RBC (CROSSMATCH)

## 2013-05-18 LAB — ABO/RH: ABO/RH(D): O POS

## 2013-05-18 MED ORDER — DIPHENHYDRAMINE HCL 25 MG PO CAPS
25.0000 mg | ORAL_CAPSULE | Freq: Once | ORAL | Status: AC
  Administered 2013-05-18: 25 mg via ORAL
  Filled 2013-05-18: qty 1

## 2013-05-18 MED ORDER — BOOST PLUS PO LIQD
237.0000 mL | Freq: Three times a day (TID) | ORAL | Status: DC
  Administered 2013-05-18 – 2013-05-23 (×12): 237 mL via ORAL
  Filled 2013-05-18 (×19): qty 237

## 2013-05-18 MED ORDER — FUROSEMIDE 10 MG/ML IJ SOLN
20.0000 mg | Freq: Once | INTRAMUSCULAR | Status: AC
  Administered 2013-05-18: 20 mg via INTRAVENOUS
  Filled 2013-05-18: qty 2

## 2013-05-18 NOTE — Progress Notes (Addendum)
Interventional Cardiology Consultation:   SUBJECTIVE: Nausea this am. No SOB or chest pain.   HPI: 80 y.o. female with history of CAD, severe AS, PAF, chronic Coumadin anticoagulation, CKD (stage III; baseline Cr 1.3-1.4), HTN, HLD, chronic diastolic CHF and chronic LE edema who was admitted to [facility] 05/11/2013 w/ mechanical fall and resultant R hip fracture. She was followed there by cardiology (Dr. Mariah Milling). On POD #2, she began to experience intermittent substernal chest pain relieved w/ morphine IV. Serial troponins revealed a mild up-trend (0.07->0.13->0.17->0.14->0.24->0.36). She underwent LHC on Jun 08, 2013 after requiring FFP to achieve an INR of 1.5. Cath showed 3 vessel CAD. LV-gram was not performed. Given her background of severe AS with multi-vessel CAD, the recommendation was made to transfer to [facility] for further evaluation by our interventional team and the CT surgery team.   Past Medical History  Diagnosis Date  . Hypertension   . Hyperlipidemia   . Severe aortic stenosis     a. 02/2013 echo: preserved EF, moderate concentric LVH, grade 2 diastolic dysfunction, severe AS- Valve area: 0.86cm^2(VTI), Valve area: 0.91cm^2 (Vmax), mild AI, mild MR, mild LAE  . CAD (coronary artery disease)     Severe 3-vessel on 04/2013 cath  . CKD (chronic kidney disease), stage III   . Chronic diastolic CHF (congestive heart failure)   . PAF (paroxysmal atrial fibrillation)   . Anticoagulated on Coumadin   . Hip fracture, right 04/2013    Mechanical fall s/p ORIF  . Heart murmur   . Pneumonia     "once; a few years ago" (2013-06-08)  . Arthritis     "knees" (June 08, 2013)  . Gout     "for a short time; it went away" (05/18/2103)  . Lumbar compression fracture     "recently dx'd (06/08/13)  . Intertrochanteric fracture of right hip 05/11/2013  . Compression fracture of lumbar spine, non-traumatic    Past Surgical History  Procedure Laterality Date  . Cholecystectomy  03/28/1998  . Orif hip  fracture Right 05/12/2013  . Cardiac catheterization  06/08/2013    80% prox LAD, 80% ostial D1, 80% prox OM2, 70% prox OM3, 60% ostial RCA, 90% and 75% prox RCA disease  . Tonsillectomy     No Known Allergies . aspirin EC  325 mg Oral Daily  . atorvastatin  20 mg Oral q1800  . ferrous sulfate  325 mg Oral BID WC  . furosemide  40 mg Oral Daily  . lactose free nutrition  237 mL Oral TID WC  . metoprolol succinate  50 mg Oral Daily  . potassium chloride  20 mEq Oral Daily  . pramipexole  0.25 mg Oral Daily  . propafenone  225 mg Oral 3 times per day  . sodium chloride  3 mL Intravenous Q12H   History   Social History  . Marital Status: Widowed    Spouse Name: N/A    Number of Children: N/A  . Years of Education: N/A   Occupational History  . Not on file.   Social History Main Topics  . Smoking status: Never Smoker   . Smokeless tobacco: Never Used  . Alcohol Use: No  . Drug Use: No  . Sexual Activity: Not on file   Other Topics Concern  . Not on file   Social History Narrative  . No narrative on file   Family History  Problem Relation Age of Onset  . CAD Mother   . CAD Father   . CAD Brother   .  Diabetes Mother    ROS: weakness, nausea   BP 90/54  Pulse 83  Temp(Src) 98.3 F (36.8 C) (Oral)  Resp 18  Ht 5\' 4"  (1.626 m)  Wt 158 lb 8.2 oz (71.9 kg)  BMI 27.19 kg/m2  SpO2 95%  Intake/Output Summary (Last 24 hours) at 05/18/13 1036 Last data filed at 05/18/13 0753  Gross per 24 hour  Intake    360 ml  Output    400 ml  Net    -40 ml    PHYSICAL EXAM General: Well developed, well nourished, in no acute distress. Alert and oriented x 3.  Psych:  Good affect, responds appropriately Neck: No JVD. No masses noted.  Lungs: Clear bilaterally with no wheezes or rhonci noted.  Heart: RRR with harsh systolic murmur noted. Abdomen: Bowel sounds are present. Soft, non-tender.  Extremities: No lower extremity edema.   LABS: Basic Metabolic Panel:  Recent  Labs  2013/10/20 2033 05/18/13 0300  NA  --  135*  K  --  4.4  CL  --  96  CO2  --  25  GLUCOSE  --  122*  BUN  --  38*  CREATININE  --  1.43*  CALCIUM  --  8.2*  MG 1.9  --    CBC:  Recent Labs  05/18/13 0815  WBC 13.7*  HGB 7.6*  HCT 22.7*  MCV 93.0  PLT 221   Fasting Lipid Panel:  Recent Labs  05/18/13 0815  CHOL 124  HDL 41  LDLCALC 64  TRIG 96  CHOLHDL 3.0    Current Meds: . aspirin EC  325 mg Oral Daily  . atorvastatin  20 mg Oral q1800  . ferrous sulfate  325 mg Oral BID WC  . furosemide  40 mg Oral Daily  . metoprolol succinate  50 mg Oral Daily  . potassium chloride  20 mEq Oral Daily  . pramipexole  0.25 mg Oral Daily  . propafenone  225 mg Oral 3 times per day  . sodium chloride  3 mL Intravenous Q12H     ASSESSMENT AND PLAN: 80 y.o. female with history of CAD, severe AS, PAF, chronic Coumadin anticoagulation, CKD (stage III; baseline Cr 1.3-1.4), HTN, HLD, chronic diastolic CHF and chronic LE edema who [facility] w/ mechanical fall and resultant R hip fracture. She was followed there by cardiology (Dr. Mariah [facility]illing[facility]oll[facility]). On POD #2, she began to experience intermittent substernal chest pain relieved w/ morphine IV. Serial troponins revealed a mild up-trend (0.07->0.13->0.17->0.14->0.24->0.36). She underwent LHC on 09-01-13 after requiring FFP to achieve an INR of 1.5. Cath showed 3 vessel CAD. LV-gram was not performed. Given her background of severe AS with multi-vessel CAD, the recommendation was made [facility] evaluation by our interventional team and the CT surgery team.    1. NSTEMI/CAD: Pt found to have severe multivessel CAD by cath [facility]01-[facility]. I reviewed her films this am. Her LAD has severe calcified stenosis at the bifurcation with the Diagonal branch which is moderate in caliber. Both OM branches are moderate in caliber with moderately severe disease and the dominant RCA has severe calcific proximal to  mid stenosis. She has had no angina at home but ruled in for MI post-op. She clearly needs revascularization but she will be very high risk for CABG with AVR. CT surgery has been asked to see her today to give an opinion on her surgical risk. If she is not felt  to be a candidate for CABG, we can consider rotational atherectomy of the RCA and LAD next week when anemia improved. This will be a high risk PCI in elderly pt with anemia, severe AS, recent hip fracture with renal insufficiency. For now, continue full-dose ASA, BB, statin, NTG SL PRN. Continue heparin infusion   2. Severe Aortic stenosis: Will repeat echo here to assess aortic valve and LVEF. I have had a long discussion with her daughter and son about traditional AVR vs TAVR. She will be high risk for traditional AVR. She could be a candidate for TAVR but this would be delayed until she has recovered from her hip repair and PCI. She has had recent admissions for CHF so her AS is likely symptomatic.   3. Chronic diastolic CHF:  well-compensated at this time. Euvolemic on exam. Continue Lasix 40mg  PO daily, KDur PO daily. Monitor daily weights   4. PAF: maintaining NSR on propafenone. Coumadin held in the setting of recent procedure and potential need for repeat cath . Continue heparin.  Continue propafenone    5. R hip fracture s/p ORIF 05/12/2013: tolerated procedure well and without complications. Pain meds initiated by ortho at [facility] continued on transfer.  Will require ortho input here.  Will ask ortho team to see her and follow her fresh surgical wound. ? Hold off on PT today.   6. CKD, stage III: stable.    7. Post-op ABL anemia: Hgb down to 7.6 this am. Will transfuse 2units pRBCs  With anemia and severe underlying CAD.    I have spent over 60 minutes on this interventional cardiology consultation. I have reviewed records from [facility], looked at cath films, formulated a plan and spent over 30 minutes discussing this plan with the  family and patient.   MCALHANY,CHRISTOPHER  3/25/201510:36 AM

## 2013-05-18 NOTE — Consult Note (Signed)
Reason for Consult:ortho management of right hip fracture s/p IM Nailing in [place] at [facility] Referring Physician: Naysha Sholl is an 80 y.o. female.  HPI: 80 yo female s/p fall and right hip fracture who initially presented to [facility] for her care and underwent orthopedic surgery for her hip who has been transferred to [facility] for further workup and treatment for chest pain and CAD.  Patient still complains of right hip pain with WB and mobilization.  There is ongoing discussion of proper management and timing of potential heart surgery.    Past Medical History  Diagnosis Date  . Hypertension   . Hyperlipidemia   . Severe aortic stenosis     a. 02/2013 echo: preserved EF, moderate concentric LVH, grade 2 diastolic dysfunction, severe AS- Valve area: 0.86cm^2(VTI), Valve area: 0.91cm^2 (Vmax), mild AI, mild MR, mild LAE  . CAD (coronary artery disease)     Severe 3-vessel on 04/2013 cath  . CKD (chronic kidney disease), stage III   . Chronic diastolic CHF (congestive heart failure)   . PAF (paroxysmal atrial fibrillation)   . Anticoagulated on Coumadin   . Hip fracture, right 04/2013    Mechanical fall s/p ORIF  . Heart murmur   . Pneumonia     "once; a few years ago" (04/26/2013)  . Arthritis     "knees" (05/10/2013)  . Gout     "for a short time; it went away" (05/18/2103)  . Lumbar compression fracture     "recently dx'd (04/24/2013)  . Intertrochanteric fracture of right hip 05/11/2013  . S/P right total hip arthroplasty 05/12/2013    Past Surgical History  Procedure Laterality Date  . Cholecystectomy  03/28/1998  . Orif hip fracture Right 05/12/2013  . Cardiac catheterization  05/21/2013    80% prox LAD, 80% ostial D1, 80% prox OM2, 70% prox OM3, 60% ostial RCA, 90% and 75% prox RCA disease  . Tonsillectomy      Family History  Problem Relation Age of Onset  . CAD Mother   . CAD Father   . CAD Brother   . Diabetes Mother     Social History:  reports that she has  never smoked. She has never used smokeless tobacco. She reports that she does not drink alcohol or use illicit drugs.  Allergies: No Known Allergies  Medications: I have reviewed the patient's current medications.  Results for orders placed during the hospital encounter of 04/28/2013 (from the past 48 hour(s))  TSH     Status: None   Collection Time    05/04/2013  8:33 PM      Result Value Ref Range   TSH 0.818  0.350 - 4.500 uIU/mL   Comment: Performed at [facility]  T4, FREE     Status: None   Collection Time    05/06/2013  8:33 PM      Result Value Ref Range   Free T4 1.37  0.80 - 1.80 ng/dL   Comment: Performed at [facility]     Status: None   Collection Time    05/11/2013  8:33 PM      Result Value Ref Range   Magnesium 1.9  1.5 - 2.5 mg/dL  URINALYSIS, ROUTINE W REFLEX MICROSCOPIC     Status: None   Collection Time    05/09/2013  8:51 PM      Result Value Ref Range   Color, Urine YELLOW  YELLOW   APPearance CLEAR  CLEAR  Specific Gravity, Urine 1.010  1.005 - 1.030   pH 5.0  5.0 - 8.0   Glucose, UA NEGATIVE  NEGATIVE mg/dL   Hgb urine dipstick NEGATIVE  NEGATIVE   Bilirubin Urine NEGATIVE  NEGATIVE   Ketones, ur NEGATIVE  NEGATIVE mg/dL   Protein, ur NEGATIVE  NEGATIVE mg/dL   Urobilinogen, UA 0.2  0.0 - 1.0 mg/dL   Nitrite NEGATIVE  NEGATIVE   Leukocytes, UA NEGATIVE  NEGATIVE   Comment: MICROSCOPIC NOT DONE ON URINES WITH NEGATIVE PROTEIN, BLOOD, LEUKOCYTES, NITRITE, OR GLUCOSE <1000 mg/dL.  BASIC METABOLIC PANEL     Status: Abnormal   Collection Time    05/18/13  3:00 AM      Result Value Ref Range   Sodium 135 (*) 137 - 147 mEq/L   Potassium 4.4  3.7 - 5.3 mEq/L   Chloride 96  96 - 112 mEq/L   CO2 25  19 - 32 mEq/L   Glucose, Bld 122 (*) 70 - 99 mg/dL   BUN 38 (*) 6 - 23 mg/dL   Creatinine, Ser 1.43 (*) 0.50 - 1.10 mg/dL   Calcium 8.2 (*) 8.4 - 10.5 mg/dL   GFR calc non Af Amer 32 (*) >90 mL/min   GFR calc Af Amer 37 (*) >90 mL/min    Comment: (NOTE)     The eGFR has been calculated using the CKD EPI equation.     This calculation has not been validated in all clinical situations.     eGFR's persistently <90 mL/min signify possible Chronic Kidney     Disease.  HEPARIN LEVEL (UNFRACTIONATED)     Status: Abnormal   Collection Time    05/18/13  3:00 AM      Result Value Ref Range   Heparin Unfractionated 0.27 (*) 0.30 - 0.70 IU/mL   Comment:            IF HEPARIN RESULTS ARE BELOW     EXPECTED VALUES, AND PATIENT     DOSAGE HAS BEEN CONFIRMED,     SUGGEST FOLLOW UP TESTING     OF ANTITHROMBIN III LEVELS.  CBC     Status: Abnormal   Collection Time    05/18/13  8:15 AM      Result Value Ref Range   WBC 13.7 (*) 4.0 - 10.5 K/uL   RBC 2.44 (*) 3.87 - 5.11 MIL/uL   Hemoglobin 7.6 (*) 12.0 - 15.0 g/dL   HCT 22.7 (*) 36.0 - 46.0 %   MCV 93.0  78.0 - 100.0 fL   MCH 31.1  26.0 - 34.0 pg   MCHC 33.5  30.0 - 36.0 g/dL   RDW 14.2  11.5 - 15.5 %   Platelets 221  150 - 400 K/uL  PROTIME-INR     Status: None   Collection Time    05/18/13  8:15 AM      Result Value Ref Range   Prothrombin Time 14.9  11.6 - 15.2 seconds   INR 1.20  0.00 - 1.49  LIPID PANEL     Status: None   Collection Time    05/18/13  8:15 AM      Result Value Ref Range   Cholesterol 124  0 - 200 mg/dL   Triglycerides 96  <150 mg/dL   HDL 41  >39 mg/dL   Total CHOL/HDL Ratio 3.0     VLDL 19  0 - 40 mg/dL   LDL Cholesterol 64  0 - 99 mg/dL   Comment:  Total Cholesterol/HDL:CHD Risk     Coronary Heart Disease Risk Table                         Men   Women      1/2 Average Risk   3.4   3.3      Average Risk       5.0   4.4      2 X Average Risk   9.6   7.1      3 X Average Risk  23.4   11.0                Use the calculated Patient Ratio     above and the CHD Risk Table     to determine the patient's CHD Risk.                ATP III CLASSIFICATION (LDL):      <100     mg/dL   Optimal      100-129  mg/dL   Near or Above                         Optimal      130-159  mg/dL   Borderline      160-189  mg/dL   High      >190     mg/dL   Very High  HEPARIN LEVEL (UNFRACTIONATED)     Status: None   Collection Time    05/18/13  8:15 AM      Result Value Ref Range   Heparin Unfractionated 0.39  0.30 - 0.70 IU/mL   Comment:            IF HEPARIN RESULTS ARE BELOW     EXPECTED VALUES, AND PATIENT     DOSAGE HAS BEEN CONFIRMED,     SUGGEST FOLLOW UP TESTING     OF ANTITHROMBIN III LEVELS.  TYPE AND SCREEN     Status: None   Collection Time    05/18/13 12:15 PM      Result Value Ref Range   ABO/RH(D) O POS     Antibody Screen NEG     Sample Expiration 05/21/2013     Unit Number [id number]     Blood Component Type RBC LR PHER1     Unit division 00     Status of Unit ALLOCATED     Transfusion Status OK TO TRANSFUSE     Crossmatch Result Compatible     Unit Number [id number]     Blood Component Type RED CELLS,LR     Unit division 00     Status of Unit ISSUED     Transfusion Status OK TO TRANSFUSE     Crossmatch Result Compatible    PREPARE RBC (CROSSMATCH)     Status: None   Collection Time    05/18/13 12:15 PM      Result Value Ref Range   Order Confirmation ORDER PROCESSED BY BLOOD BANK    ABO/RH     Status: None   Collection Time    05/18/13 12:15 PM      Result Value Ref Range   ABO/RH(D) O POS      Dg Pelvis Portable  05/18/2013   CLINICAL DATA:  Postop right hip ORIF  EXAM: PORTABLE PELVIS 1-2 VIEWS  COMPARISON:  None.  FINDINGS: Right proximal femoral intra medullary nail with 2 cannulated interlocking screws transfixing a  comminuted intertrochanteric fracture with medial displacement of the lesser trochanter. The alignment is near anatomic. There is no other fracture or dislocation.  IMPRESSION: ORIF right intertrochanteric fracture.   Electronically Signed   By: Kathreen Devoid   On: 05/18/2013 15:58   Dg Hip Portable 1 View Right  05/18/2013   CLINICAL DATA:  ORIF right hip  EXAM: PORTABLE  RIGHT HIP - 1 VIEW  COMPARISON:  None.  FINDINGS: Right proximal femoral intra medullary nail with 2 cannulated interlocking screws transfixing a comminuted right intertrochanteric fracture with medial displacement of the lesser trochanter. The overall alignment is near anatomic. There is no dislocation.  IMPRESSION: ORIF right intertrochanteric fracture.   Electronically Signed   By: Kathreen Devoid   On: 05/18/2013 16:06    ROS Blood pressure 81/59, pulse 77, temperature 98.4 F (36.9 C), temperature source Oral, resp. rate 18, height 5' 4"  (1.626 m), weight 71.9 kg (158 lb 8.2 oz), SpO2 96.00%. Physical Exam   Healthy appearing female supine in bed. She appears anemic.  Bilateral UEs with AROM no pain.  Left LE with pain free AROM and normal perfusion distally, NVI. Right LE with limited ROM due to pain, no palp cords, incision CDI, dressing clean and dry, NVI  Assessment/Plan: S/p fall with basicervical femoral neck fracture treated with closed reduction and IM nailing, stable from ortho standpoint. When cleared from medical and cards standpoint ok for mobilization, NWB on the right LE. Transfers and W/C ambulation likely the best option until her cardiac issues are addressed and her strength returns. Anemia-  Receiving transfusion now.  Will follow.   Sammye Staff,STEVEN R 05/18/2013, 4:39 PM

## 2013-05-18 NOTE — Progress Notes (Signed)
Clinical Social Work Department BRIEF PSYCHOSOCIAL ASSESSMENT 05/18/2013  Patient:  Madeline Perez, Madeline Perez     Account Number:  [id number]     Admit date:  Jun 12, 2013  Clinical Social Worker:  Carren Rang  Date/Time:  05/18/2013 11:32 AM  Referred by:  Physician  Date Referred:  05/18/2013 Referred for  SNF Placement   Other Referral:   Interview type:  Other - See comment Other interview type:   CSW spoke to patient and patient's family by bedside    PSYCHOSOCIAL DATA Living Status:  FACILITY Admitted from facility:  [facility] Level of care:  Independent Living Primary support name:  Satsuki Berganza Primary support relationship to patient:  CHILD, ADULT Degree of support available:   Good    CURRENT CONCERNS Current Concerns  Post-Acute Placement   Other Concerns:    SOCIAL WORK ASSESSMENT / PLAN CSW received consult that patient is from [facility] and would like to go to their rehab side of it at discharge. CSW went into room and introduced self to patient and patient's family. Patient's family states that patient has been to their rehab section before and would like to go back when discharged, but states discharge may be a while. CSW explained that social worker will send clinicals to [facility] and keep them updated. Assessment got disrupted by patient spitting up. CSW provided contact information and will follow up with family and patient.   Assessment/plan status:  Psychosocial Support/Ongoing Assessment of Needs Other assessment/ plan:   Information/referral to community resources:   csw contact information    PATIENT'S/FAMILY'S RESPONSE TO PLAN OF CARE: Patient's family thanked Child psychotherapist for visit and states they spoke with [facility] and updated them already today.       Maree Krabbe, MSW, Theresia Majors [phone]

## 2013-05-18 NOTE — Consult Note (Addendum)
[address]       [name] [address]             [phone]          CARDIOTHORACIC SURGERY CONSULTATION REPORT  PCP is Duncan Dull, MD Primary Cardiologist is Julien Nordmann J,MD Referring Provider is Kathleene Hazel, MD   Reason for consultation:  Severe aortic stenosis and 3-vessel CAD  HPI:  Patient is an 80 year old recently widowed white female from [place] with long history of aortic stenosis, hypertension, recurrent paroxysmal atrial fibrillation for which she is anticoagulated using Coumadin, chronic kidney disease, and chronic diastolic congestive heart failure.  The patient states that she has known she had a heart murmur for many years and she has been followed by Dr. Mariah Milling with serial echocardiograms for aortic stenosis which has gradually progressed in severity.  She developed paroxysmal atrial fibrillation nearly 20 years ago and has remained chronically anticoagulated with Coumadin since that time. Over the last few years she developed symptoms of exertional shortness of breath and chronic lower extremity edema, and she was hospitalized in October of 2013 for acute exacerbation of chronic diastolic congestive heart failure when she had temporarily stopped taking her medications.  An echocardiogram performed last spring demonstrated the presence of moderate aortic stenosis with preserved left ventricular systolic function. In 08/27/22 her husband passed away.  She was seen this past January and February by Dr. Mariah Milling who noted that she complained of progressive fatigue and some depression which she related to the recent loss of her husband. A followup echocardiogram was performed 03/10/2013 that revealed significant progression of the severity of aortic stenosis with peak velocity across the aortic valve increased to 4.2 m/s, up from 3.4 m/s last year. The mean transvalvular gradient had increased from 29 to 46 mmHg, respectively. Left ventricular  systolic function remained normal with ejection fraction estimated 60-65%. At the time the patient denied any associated symptoms of dyspnea, but discussions were entertained regarding the possibility that she might need to be referred for surgical consultation in the not too distant future.  The patient states that around the first week of March she began to experience pain in her right lower back and hip.  The pain became severe, prompting her to be evaluated in the emergency department at [facility].  She was told that she had arthritis and released. The pain got worse and the patient also began to experience acute exacerbation of shortness of breath. She was hospitalized at [facility] with acute exacerbation of congestive heart failure. During that admission she was found to have compression fracture in the lumbar spine. She was discharged to rehabilitation where she made gradual improvement until 05/11/2013 when she fell and suffered an intertrochanteric fracture of her right hip.  She was taken back to [facility] where she underwent ORIF on 05/12/2013.  On postoperative day 2 the patient began to experience intermittent substernal chest pain. Serial troponins revealed a mild gradual increase to peak troponin 0.36.  Left heart catheterization was performed 05/06/2013 and revealed severe three-vessel coronary artery disease. Left ventricular function was not assessed and the aortic valve was not crossed. Right heart catheterization was not performed. The patient was transferred [facility] for further management. On arrival the patient was noted to be severely anemic with hemoglobin 7.6.  She did have a brief episode of substernal chest pressure this morning without significant EKG changes that result following administration of sublingual nitroglycerin. Cardiothoracic surgical consultation was requested.  The patient lives alone in her 3 bedroom house at [facility].  Up until recently the patient has  remained functionally independent. She drives an automobile and reports no significant problems with ambulation, although she does occasionally use her husband's walker to steady herself when she walks. She admits to a long history of exertional shortness of breath but she also admits that she remains relatively sedentary. Up until the present she states that exertional shortness of breath has not significantly limited her daily physical activities. Her first episode of substernal chest pressure occurred this past weekend after her total hip replacement. She has never previously had any history of chest pain or chest tightness either with exertion or at rest. Prior to admission she denied any history of resting shortness of breath, PND, or orthopnea. She has chronic lower extremity edema which waxes and wanes in severity. She has occasional palpitations and mild dizzy spells without any history of syncope. She notes that at present she feels extremely weak. Her appetite is poor. She has significant pain in her hip and back.   Past Medical History  Diagnosis Date  . Hypertension   . Hyperlipidemia   . Severe aortic stenosis     a. 02/2013 echo: preserved EF, moderate concentric LVH, grade 2 diastolic dysfunction, severe AS- Valve area: 0.86cm^2(VTI), Valve area: 0.91cm^2 (Vmax), mild AI, mild MR, mild LAE  . CAD (coronary artery disease)     Severe 3-vessel on 04/2013 cath  . CKD (chronic kidney disease), stage III   . Chronic diastolic CHF (congestive heart failure)   . PAF (paroxysmal atrial fibrillation)   . Anticoagulated on Coumadin   . Hip fracture, right 04/2013    Mechanical fall s/p ORIF  . Heart murmur   . Pneumonia     "once; a few years ago" (05/20/2013)  . Arthritis     "knees" (05/16/2013)  . Gout     "for a short time; it went away" (05/18/2103)  . Lumbar compression fracture     "recently dx'd (05/16/2013)  . Intertrochanteric fracture of right hip 05/11/2013  . S/P right total  hip arthroplasty 05/12/2013    Past Surgical History  Procedure Laterality Date  . Cholecystectomy  03/28/1998  . Orif hip fracture Right 05/12/2013  . Cardiac catheterization  04/24/2013    80% prox LAD, 80% ostial D1, 80% prox OM2, 70% prox OM3, 60% ostial RCA, 90% and 75% prox RCA disease  . Tonsillectomy      Family History  Problem Relation Age of Onset  . CAD Mother   . CAD Father   . CAD Brother   . Diabetes Mother     History   Social History  . Marital Status: Widowed    Spouse Name: N/A    Number of Children: N/A  . Years of Education: N/A   Occupational History  . Not on file.   Social History Main Topics  . Smoking status: Never Smoker   . Smokeless tobacco: Never Used  . Alcohol Use: No  . Drug Use: No  . Sexual Activity: Not on file   Other Topics Concern  . Not on file   Social History Narrative  . No narrative on file    Prior to Admission medications   Medication Sig Start Date End Date Taking? Authorizing Provider  acetaminophen (TYLENOL) 500 MG tablet Take 1,000 mg by mouth 4 (four) times daily as needed (pain).   Yes Historical Provider, MD  allopurinol (ZYLOPRIM)  100 MG tablet Take 100 mg by mouth daily.   Yes Historical Provider, MD  amLODipine (NORVASC) 5 MG tablet Take 5 mg by mouth daily.   Yes Historical Provider, MD  furosemide (LASIX) 40 MG tablet Take 40 mg by mouth See admin instructions. Take 1 tablet (40 mg) daily, if weight goes above 155 lbs take an extra tablet   Yes Historical Provider, MD  metoprolol succinate (TOPROL-XL) 50 MG 24 hr tablet Take 50 mg by mouth daily. Take with or immediately following a meal.   Yes Historical Provider, MD  Multiple Vitamins-Minerals (OCUVITE PO) Take 1 tablet by mouth daily.   Yes Historical Provider, MD  oxycodone (OXY-IR) 5 MG capsule Take 5 mg by mouth every 6 (six) hours as needed for pain.   Yes Historical Provider, MD  potassium chloride SA (K-DUR,KLOR-CON) 20 MEQ tablet Take 10 mEq by mouth  daily.   Yes Historical Provider, MD  pramipexole (MIRAPEX) 0.25 MG tablet Take 0.25 mg by mouth at bedtime.   Yes Historical Provider, MD  propafenone (RYTHMOL) 225 MG tablet Take 225 mg by mouth 3 (three) times daily.   Yes Historical Provider, MD  rosuvastatin (CRESTOR) 10 MG tablet Take 10 mg by mouth at bedtime.   Yes Historical Provider, MD  traZODone (DESYREL) 50 MG tablet Take 50 mg by mouth at bedtime as needed for sleep.   Yes Historical Provider, MD  warfarin (COUMADIN) 2 MG tablet Take 2 mg by mouth at bedtime.   Yes Historical Provider, MD    Current Facility-Administered Medications  Medication Dose Route Frequency Provider Last Rate Last Dose  . 0.9 %  sodium chloride infusion  250 mL Intravenous PRN Roger A Arguello, PA-C      . acetaminophen (TYLENOL) tablet 650 mg  650 mg Oral Q4H PRN Gery Pray, PA-C   650 mg at May 31, 2013 2048  . aspirin EC tablet 325 mg  325 mg Oral Daily Roger A Arguello, PA-C   325 mg at 05/18/13 1143  . atorvastatin (LIPITOR) tablet 20 mg  20 mg Oral q1800 Roger A Arguello, PA-C      . docusate sodium (COLACE) capsule 200 mg  200 mg Oral Daily PRN Roger A Arguello, PA-C      . ferrous sulfate tablet 325 mg  325 mg Oral BID WC Roger A Arguello, PA-C   325 mg at 05/18/13 0753  . furosemide (LASIX) tablet 40 mg  40 mg Oral Daily Roger A Arguello, PA-C   40 mg at 05/18/13 1140  . heparin ADULT infusion 100 units/mL (25000 units/250 mL)  950 Units/hr Intravenous Continuous Gardner Candle, RPH 9.5 mL/hr at 05-31-13 2024 950 Units/hr at 05/31/13 2024  . HYDROcodone-acetaminophen (NORCO/VICODIN) 5-325 MG per tablet 1 tablet  1 tablet Oral Q6H PRN Gery Pray, PA-C   1 tablet at 2013/05/31 2328  . lactulose (CHRONULAC) 10 GM/15ML solution 20 g  20 g Oral BID PRN Roger A Arguello, PA-C      . metoprolol succinate (TOPROL-XL) 24 hr tablet 50 mg  50 mg Oral Daily Roger A Arguello, PA-C      . nitroGLYCERIN (NITROSTAT) SL tablet 0.4 mg  0.4 mg Sublingual Q5  Min x 3 PRN Roger A Arguello, PA-C   0.4 mg at 05/18/13 1353  . ondansetron (ZOFRAN) injection 4 mg  4 mg Intravenous Q6H PRN Gery Pray, PA-C   4 mg at 05/18/13 4098  . potassium chloride SA (K-DUR,KLOR-CON) CR tablet 20 mEq  20 mEq Oral Daily Roger A Arguello, PA-C   20 mEq at 05/18/13 1142  . pramipexole (MIRAPEX) tablet 0.25 mg  0.25 mg Oral Daily Roger A Arguello, PA-C   0.25 mg at 05/18/13 1142  . propafenone (RYTHMOL) tablet 225 mg  225 mg Oral 3 times per day Gery Pray, PA-C   225 mg at 05/18/13 1401  . sodium chloride 0.9 % injection 3 mL  3 mL Intravenous Q12H Roger A Arguello, PA-C      . sodium chloride 0.9 % injection 3 mL  3 mL Intravenous PRN Roger A Arguello, PA-C        No Known Allergies    Review of Systems:   General:  decreased appetite, decreased energy, no weight gain, no weight loss, no fever  Cardiac:  + chest pain with exertion, + chest pain at rest, + SOB with exertion, no resting SOB, no PND, no orthopnea, + palpitations, + arrhythmia, + atrial fibrillation, + LE edema, + dizzy spells, no syncope  Respiratory:  + shortness of breath, no home oxygen, no productive cough, + dry cough, no bronchitis, no wheezing, no hemoptysis, no asthma, no pain with inspiration or cough, no sleep apnea, no CPAP at night  GI:   no difficulty swallowing, no reflux, no frequent heartburn, no hiatal hernia, no abdominal pain, + constipation, no diarrhea, no hematochezia, no hematemesis, no melena  GU:   no dysuria,  no frequency, no urinary tract infection, no hematuria, no kidney stones, + kidney disease with baseline creatinine 1.4-1.5  Vascular:  no pain suggestive of claudication, no pain in feet, no leg cramps, no varicose veins, no DVT, no non-healing foot ulcer  Neuro:   no stroke, no TIA's, no seizures, no headaches, no temporary blindness one eye,  no slurred speech, no peripheral neuropathy, no chronic pain, + instability of gait, no memory/cognitive  dysfunction  Musculoskeletal: + arthritis, + joint swelling, no myalgias, + difficulty walking, severely limited mobility at present  Skin:   no rash, no itching, no skin infections, no pressure sores or ulcerations  Psych:   no anxiety, + depression, no nervousness, + unusual recent stress  Eyes:   + blurry vision, no floaters, no recent vision changes, + wears glasses or contacts  ENT:   + hearing loss, no loose or painful teeth, + dentures, last saw dentist 1 year ago  Hematologic:  + easy bruising, + abnormal bleeding due to coumadin, no clotting disorder, no frequent epistaxis, no complications on coumadin therapy  Endocrine:  no diabetes, does not check CBG's at home     Physical Exam:   BP 98/50  Pulse 84  Temp(Src) 98.2 F (36.8 C) (Axillary)  Resp 18  Ht 5\' 4"  (1.626 m)  Wt 71.9 kg (158 lb 8.2 oz)  BMI 27.19 kg/m2  SpO2 94%  General:  Thin and frail-appearing  HEENT:  Unremarkable   Neck:   no JVD, no bruits, no adenopathy   Chest:   clear to auscultation, symmetrical breath sounds but diminished at bases, no wheezes, no rhonchi   CV:   RRR, grade III/VI crescendo/decrescendo systolic murmur   Abdomen:  soft, non-tender, no masses   Extremities:  warm, well-perfused, pulses not palpable, mild lower extremity edema  Rectal/GU  Deferred  Neuro:   Grossly non-focal and symmetrical throughout  Skin:   Clean and dry, no rashes, no breakdown but very thin and frail  Diagnostic Tests:  Transthoracic Echocardiography  Patient: Madeline Perez, Madeline Perez MR #:  [id number] Study Date: 03/10/2013 Gender: F Age: 88 Height: 162.6cm Weight: 69.9kg BSA: 1.46m^2 Pt. Status: Room:  ATTENDING Nunzio Cobbs, Tim REFERRING Mariah Milling, Tim SONOGRAPHER Weston Settle, RDCS, RVT PERFORMING [facility], [place] cc:  ------------------------------------------------------------ LV EF: 60% - 65%  ------------------------------------------------------------ History: PMH: Acquired from  the patient and from the patient's chart. Dyspnea, murmur, and generalized edema. Risk factors: Hypertension. Dyslipidemia.  ------------------------------------------------------------ Study Conclusions  - Left ventricle: The cavity size was normal. There was moderate concentric hypertrophy. Systolic function was normal. The estimated ejection fraction was in the range of 60% to 65%. Wall motion was normal; there were no regional wall motion abnormalities. Features are consistent with a pseudonormal left ventricular filling pattern, with concomitant abnormal relaxation and increased filling pressure (grade 2 diastolic dysfunction). - Aortic valve: Moderately calcified annulus. Trileaflet; severely thickened, severely calcified leaflets. Cusp separation was severely reduced. There was severe stenosis. Mild regurgitation. Valve area: 0.86cm^2(VTI). Valve area: 0.91cm^2 (Vmax). - Mitral valve: Moderately calcified annulus. Mild regurgitation. Valve area by continuity equation (using LVOT flow): 1.7cm^2. - Left atrium: The atrium was mildly dilated. Impressions:  - When compared to report from 12/06/12, aortic stenosis has advanced from moderate to severe (prior peak velocity 3.75m/s). Transthoracic echocardiography. M-mode, complete 2D, spectral Doppler, and color Doppler. Height: Height: 162.6cm. Height: 64in. Weight: Weight: 69.9kg. Weight: 153.7lb. Body mass index: BMI: 26.4kg/m^2. Body surface area: BSA: 1.76m^2. Blood pressure: 140/70. Patient status: Outpatient.  ------------------------------------------------------------  ------------------------------------------------------------ Left ventricle: The cavity size was normal. There was moderate concentric hypertrophy. Systolic function was normal. The estimated ejection fraction was in the range of 60% to 65%. Wall motion was normal; there were no regional wall motion abnormalities. Features are consistent with  a pseudonormal left ventricular filling pattern, with concomitant abnormal relaxation and increased filling pressure (grade 2 diastolic dysfunction).  ------------------------------------------------------------ Aortic valve: Moderately calcified annulus. Trileaflet; severely thickened, severely calcified leaflets. Cusp separation was severely reduced. Doppler: There was severe stenosis. Mild regurgitation. VTI ratio of LVOT to aortic valve: 0.27. Valve area: 0.86cm^2(VTI). Indexed valve area: 0.49cm^2/m^2 (VTI). Peak velocity ratio of LVOT to aortic valve: 0.29. Valve area: 0.91cm^2 (Vmax). Indexed valve area: 0.52cm^2/m^2 (Vmax). Mean gradient: 46mm Hg (S). Peak gradient: 71mm Hg (S).  ------------------------------------------------------------ Aorta: The aorta was normal, not dilated, and non-diseased.  ------------------------------------------------------------ Mitral valve: Moderately calcified annulus. Doppler: Mild regurgitation. Valve area by pressure half-time: 2.78cm^2. Indexed valve area by pressure half-time: 1.59cm^2/m^2. Valve area by continuity equation (using LVOT flow): 1.7cm^2. Indexed valve area by continuity equation (using LVOT flow): 0.97cm^2/m^2. Mean gradient: 5mm Hg (D). Peak gradient: 13mm Hg (D).  ------------------------------------------------------------ Left atrium: The atrium was mildly dilated.  ------------------------------------------------------------ Right ventricle: The cavity size was normal. Wall thickness was normal. Systolic function was normal.  ------------------------------------------------------------ Pulmonic valve: Structurally normal valve. Cusp separation was normal. Doppler: Transvalvular velocity was within the normal range. Trivial regurgitation.  ------------------------------------------------------------ Tricuspid valve: Structurally normal valve. Leaflet separation was normal. Doppler: Transvalvular velocity  was within the normal range. Mild regurgitation.  ------------------------------------------------------------ Pulmonary artery: The main pulmonary artery was normal-sized.  ------------------------------------------------------------ Right atrium: The atrium was normal in size.  ------------------------------------------------------------ Pericardium: The pericardium was normal in appearance. There was no pericardial effusion.  ------------------------------------------------------------ Systemic veins: Inferior vena cava: The vessel was normal in size; the respirophasic diameter changes were blunted (< 50%); findings are consistent with elevated central venous pressure.  ------------------------------------------------------------ Post procedure conclusions Ascending Aorta:  - The aorta was normal, not dilated, and non-diseased.  ------------------------------------------------------------  2D measurements Normal Doppler measurements  Normal Left ventricle Main pulmonary LVID ED, 49.8 mm 43-52 artery chord, Pressure, 5 mm Hg =30 PLAX S LVID ES, 32.4 mm 23-38 LVOT chord, Peak vel, 121 cm/s ------ PLAX S FS, chord, 35 % >29 VTI, S 33.8 cm ------ PLAX Peak 6 mm Hg ------ LVPW, ED 13.7 mm ------ gradient, IVS/LVPW 1.12 <1.3 S ratio, ED Aortic valve Vol ED, 87 ml ------ Peak vel, 420 cm/s ------ MOD1 S Vol ES, 27 ml ------ Mean vel, 307 cm/s ------ MOD1 S EF, MOD1 69 % ------ VTI, S 124 cm ------ Vol index, 50 ml/m^2 ------ Mean 46 mm Hg ------ ED, MOD1 gradient, Vol index, 15 ml/m^2 ------ S ES, MOD1 Peak 71 mm Hg ------ Vol ED, 90 ml ------ gradient, MOD2 S Vol ES, 31 ml ------ VTI ratio 0.27 ------ MOD2 LVOT/AV EF, MOD2 66 % ------ Area, VTI 0.86 cm^2 ------ Stroke 59 ml ------ Area index 0.49 cm^2/m ------ vol, MOD2 (VTI) ^2 Vol index, 51 ml/m^2 ------ Peak vel 0.29 ------ ED, MOD2 ratio, Vol index, 18 ml/m^2 ------ LVOT/AV ES, MOD2 Area, Vmax 0.91 cm^2  ------ Stroke 33.7 ml/m^2 ------ Area index 0.52 cm^2/m ------ index, (Vmax) ^2 MOD2 Regurg PHT 421 ms ------ Ventricular septum Mitral valve IVS, ED 15.4 mm ------ Peak E vel 145 cm/s ------ LVOT Peak A vel 123 cm/s ------ Diam, S 20 mm ------ Mean vel, 102 cm/s ------ Area 3.14 cm^2 ------ D Aorta Decelerati 169 ms 150-23 Root diam, 30 mm ------ on time 0 ED Pressure 79 ms ------ Left atrium half-time AP dim 41 mm ------ Mean 5 mm Hg ------ AP dim 2.34 cm/m^2 <2.2 gradient, index D Vol, S 45 ml ------ Peak 13 mm Hg ------ Vol index, 25.7 ml/m^2 ------ gradient, S D Right ventricle Peak E/A 1.2 ------ RVID ED, 27.6 mm 19-38 ratio PLAX Area (PHT) 2.78 cm^2 ------ Area index 1.59 cm^2/m ------ (PHT) ^2 Area 1.7 cm^2 ------ (LVOT) continuity Area index 0.97 cm^2/m ------ (LVOT ^2 cont) Annulus 62.4 cm ------ VTI Tricuspid valve Regurg 300 cm/s ------ peak vel Peak RV-RA 36 mm Hg ------ gradient, S Max regurg 300 cm/s ------ vel Pulmonic valve Peak vel, 96.1 cm/s ------ S  ------------------------------------------------------------ Prepared and Electronically Authenticated by  Donato SchultzSkains, Mark 2015-01-15T21:44:[id number]    Cardiac Catheterization  Left heart catheterization performed 08/22/13 [facility] reveals severe three-vessel coronary artery disease. There is long segment 90% proximal stenosis of the right coronary artery with right dominant recirculation. There is heavily calcified 70% stenosis of the proximal left anterior descending coronary artery involving bifurcation with the first diagonal branch. There is 80% ostial stenosis of the diagonal branch.  There is moderate 50% stenosis in the left circumflex coronary artery. Distal target vessels appear suitable for coronary artery bypass grafting. Left ventricular function was not assessed. Right heart catheterization was not performed.    STS Risk Calculator  Procedure    AVR + CABG  Risk of  Mortality   12.5% Morbidity or Mortality  40.7% Prolonged LOS   22.9% Short LOS    5.3% Permanent Stroke   3.5% Prolonged Vent Support  30.8% DSW Infection    0.4% Renal Failure    19.3% Reoperation    12.7%    Impression:  Patient has severe aortic stenosis with three-vessel coronary artery disease. At the time of her echocardiogram performed this past January left ventricular systolic function remained normal with ejection fraction estimated 60-65%. She does have significant diastolic dysfunction, recurrent paroxysmal atrial fibrillation and a  long history of chronic fatigue and chronic diastolic congestive heart failure, with recent symptoms consistent with New York Heart Association functional class II at least.  She was hospitalized last week with an intertrochanteric fracture of her right hip for which she underwent ORIF on 05/12/2013. Postoperatively she developed symptoms of unstable angina and has ruled in for non-ST segment elevation myocardial infarction based on serial cardiac enzymes which developed in the setting of severe anemia.  I have personally reviewed the patient's most recent echocardiogram and left heart catheterization.  She clearly has severe aortic stenosis, although it's probably not critically tight.  Her coronary artery disease is certainly significant, particularly the high-grade stenosis in the right coronary artery. However, it's also clear that her cardiac symptoms were quite stable until she broke her hip and underwent ORIF last week.  Under elective circumstances risks associated with conventional surgical aortic valve replacement and coronary artery bypass grafting would be high but potentially not insurmountable. However, I would not consider this patient a candidate for conventional surgery under the current circumstances because of her acutely decompensated condition.    Plan:  I discussed matters at length with the patient and her family this afternoon.  Alternative treatment strategies a for the management of both severe symptomatic aortic stenosis and coronary artery disease were discussed at length. They understand that conventional surgical aortic valve replacement and coronary artery bypass grafting would be associated with extremely high risks under the present circumstances. We discussed alternative treatment strategies in detail including continued medical therapy versus PCI and stenting of her coronary artery disease with or without possible delayed transcatheter aortic valve replacement.  I agree with plans for repeat echocardiogram to reassess her aortic stenosis and left ventricular function.  I would recommend slowly transfusing the patient to achieve hemoglobin greater than 10.  PCI and stenting of the proximal right coronary artery may prove both reasonable and beneficial, particularly if the patient continues to experience symptoms of angina after her anemia has been treated.  If repeat catheterization is performed it might be reasonable to consider FloWire assessment of the left anterior descending coronary artery to assess the functional significance of its proximal disease.  I would favor defering any further workup for possible TAVR until after she has recovered from her hip fracture.  We would be happy to follow her through the multidisciplinary heart valve clinic.  All of their questions been addressed.   I spent in excess of 120 minutes during the conduct of this hospital consultation and >50% of this time involved direct face-to-face encounter for counseling and/or coordination of the patient's care.   Salvatore Decent. Cornelius Moras, MD 05/18/2013 4:12 PM

## 2013-05-18 NOTE — Progress Notes (Signed)
Patient complaining of chest pressure. Sated that it feel like she has a chest binder on. EKG obtained, VS, and MD made aware. MD gave order to give nitro SL. Patient states that chest pressure has decreased. Will continue to monitor and follow up with Margie Billet PA if pressure persists/worsens. Stanton Kidney R

## 2013-05-18 NOTE — Progress Notes (Signed)
ANTICOAGULATION CONSULT NOTE - follow up Pharmacy Consult for Heparin Indication: CAD awaiting CABG consult  No Known Allergies  Patient Measurements: Height: 5\' 4"  (162.6 cm) Weight: 158 lb 8.2 oz (71.9 kg) IBW/kg (Calculated) : 54.7 Heparin Dosing Weight: 70 kg  Vital Signs: Temp: 98.3 F (36.8 C) (03/25 0845) Temp src: Oral (03/25 0845) BP: 90/54 mmHg (03/25 0845) Pulse Rate: 83 (03/25 0433)  Labs:  Recent Labs  05/18/13 0300 05/18/13 0815  HGB  --  7.6*  HCT  --  22.7*  PLT  --  221  LABPROT  --  14.9  INR  --  1.20  HEPARINUNFRC 0.27* 0.39  CREATININE 1.43*  --     Estimated Creatinine Clearance: 27 ml/min (by C-G formula based on Cr of 1.43).    Assessment: 80 y.o. female w/ PMHx s/f HFpEF, severe AS, PAF, chronic Coumadin anticoagulation, CKD (stage III; baseline Cr 1.3-1.4), HTN, HLD and chronic LE edema who was admitted to [facility] 05/11/2013 w/ mechanical fall and resultant R hip fracture. S/p ORIF 3/19 developed chest pain, and underwent LHC 04/27/2013.  INR was 1.5 after receiving FFP.  Found with 3V CAD and given severe AS was txf to [facility] for TCTS evaluation.  Coumadin on hold for now.  Was receiving full dose Lovenox (70 mg q 24) prior to cath at [facility], it appears last dose was sometime 3/23 prior to 10 AM, but records not clear.  From [facility]: 3/23 H/H 8.4/25.6; Pltc 241 3/23 Scr 1.27 3/24 INR 1.5  She was started on heparin drip with no bolus 3/24 PM at 950 units/hr.  The first HL was drawn 6.5 hrs after start was 0.27 at 0300 am.  Repeat HL at 0815 am is therapeutic at 0.39 on rate of 950 units/hr.  INR 1.20. Hg low at 7.6, pltc 221.  No bleeding reported.   Goal of Therapy:  Heparin level 0.3-0.7 units/ml Monitor platelets by anticoagulation protocol: Yes   Plan:  1. Continue heparin at rate of 950 units/hr. 2. Daily heparin level and CBC. 3. F/u plans for surgery vs. Resuming Coumadin. Herby Abraham, Pharm.D. [phone] 05/18/2013 10:28 AM

## 2013-05-19 ENCOUNTER — Inpatient Hospital Stay (HOSPITAL_COMMUNITY): Payer: Medicare Other

## 2013-05-19 DIAGNOSIS — I359 Nonrheumatic aortic valve disorder, unspecified: Secondary | ICD-10-CM

## 2013-05-19 DIAGNOSIS — Z01810 Encounter for preprocedural cardiovascular examination: Secondary | ICD-10-CM

## 2013-05-19 DIAGNOSIS — N189 Chronic kidney disease, unspecified: Secondary | ICD-10-CM

## 2013-05-19 LAB — HEPARIN LEVEL (UNFRACTIONATED): Heparin Unfractionated: 0.4 IU/mL (ref 0.30–0.70)

## 2013-05-19 LAB — BASIC METABOLIC PANEL
BUN: 52 mg/dL — ABNORMAL HIGH (ref 6–23)
CHLORIDE: 96 meq/L (ref 96–112)
CO2: 23 mEq/L (ref 19–32)
Calcium: 8.4 mg/dL (ref 8.4–10.5)
Creatinine, Ser: 2.01 mg/dL — ABNORMAL HIGH (ref 0.50–1.10)
GFR calc Af Amer: 25 mL/min — ABNORMAL LOW (ref >90)
GFR calc non Af Amer: 21 mL/min — ABNORMAL LOW (ref >90)
GLUCOSE: 116 mg/dL — AB (ref 70–99)
POTASSIUM: 4.7 meq/L (ref 3.7–5.3)
SODIUM: 135 meq/L — AB (ref 137–147)

## 2013-05-19 LAB — TYPE AND SCREEN
ABO/RH(D): O POS
Antibody Screen: NEGATIVE
Unit division: 0
Unit division: 0

## 2013-05-19 LAB — CBC
HCT: 29.3 % — ABNORMAL LOW (ref 36.0–46.0)
HEMOGLOBIN: 9.9 g/dL — AB (ref 12.0–15.0)
MCH: 30.9 pg (ref 26.0–34.0)
MCHC: 33.8 g/dL (ref 30.0–36.0)
MCV: 91.6 fL (ref 78.0–100.0)
Platelets: 275 10*3/uL (ref 150–400)
RBC: 3.2 MIL/uL — AB (ref 3.87–5.11)
RDW: 15.2 % (ref 11.5–15.5)
WBC: 16.7 10*3/uL — ABNORMAL HIGH (ref 4.0–10.5)

## 2013-05-19 LAB — PROTIME-INR
INR: 1.28 (ref 0.00–1.49)
PROTHROMBIN TIME: 15.7 s — AB (ref 11.6–15.2)

## 2013-05-19 MED ORDER — POLYETHYLENE GLYCOL 3350 17 G PO PACK
17.0000 g | PACK | Freq: Every day | ORAL | Status: DC
  Administered 2013-05-19: 17 g via ORAL
  Filled 2013-05-19 (×5): qty 1

## 2013-05-19 MED ORDER — DOCUSATE SODIUM 100 MG PO CAPS
200.0000 mg | ORAL_CAPSULE | Freq: Two times a day (BID) | ORAL | Status: DC
  Administered 2013-05-19 – 2013-05-20 (×3): 200 mg via ORAL
  Filled 2013-05-19 (×10): qty 2

## 2013-05-19 MED ORDER — HEPARIN (PORCINE) IN NACL 100-0.45 UNIT/ML-% IJ SOLN
950.0000 [IU]/h | INTRAMUSCULAR | Status: DC
  Administered 2013-05-19 – 2013-05-20 (×2): 950 [IU]/h via INTRAVENOUS
  Filled 2013-05-19 (×3): qty 250

## 2013-05-19 MED ORDER — ALBUTEROL SULFATE (2.5 MG/3ML) 0.083% IN NEBU: 2.5000 mg | INHALATION_SOLUTION | Freq: Once | RESPIRATORY_TRACT | Status: DC

## 2013-05-19 MED ORDER — BISACODYL 10 MG RE SUPP
10.0000 mg | Freq: Every day | RECTAL | Status: DC | PRN
  Administered 2013-05-19: 10 mg via RECTAL
  Filled 2013-05-19: qty 1

## 2013-05-19 MED ORDER — GERHARDT'S BUTT CREAM
TOPICAL_CREAM | Freq: Two times a day (BID) | CUTANEOUS | Status: DC
  Administered 2013-05-19: 1 via TOPICAL
  Administered 2013-05-19 – 2013-05-20 (×3): via TOPICAL
  Administered 2013-05-21: 1 via TOPICAL
  Administered 2013-05-21 – 2013-05-23 (×4): via TOPICAL
  Filled 2013-05-19: qty 1

## 2013-05-19 MED ORDER — FUROSEMIDE 40 MG PO TABS
40.0000 mg | ORAL_TABLET | Freq: Every day | ORAL | Status: DC
  Administered 2013-05-20 – 2013-05-23 (×4): 40 mg via ORAL
  Filled 2013-05-19 (×4): qty 1

## 2013-05-19 MED ORDER — POTASSIUM CHLORIDE CRYS ER 20 MEQ PO TBCR
20.0000 meq | EXTENDED_RELEASE_TABLET | Freq: Every day | ORAL | Status: DC
  Administered 2013-05-20: 10 meq via ORAL
  Administered 2013-05-21 – 2013-05-23 (×3): 20 meq via ORAL
  Filled 2013-05-19 (×4): qty 1

## 2013-05-19 NOTE — Progress Notes (Signed)
Orthopedics Progress Note  Subjective: Patient complaining of hemorrhoids. No complaints of the hip.  Objective:  Filed Vitals:   05/19/13 0402  BP: 103/53  Pulse: 79  Temp: 98.5 F (36.9 C)  Resp: 20    General: Awake and alert  Musculoskeletal: right hip dressing clean and dry. Mod swelling. No erythema Neurovascularly intact  Lab Results  Component Value Date   WBC 16.7* 05/19/2013   HGB 9.9* 05/19/2013   HCT 29.3* 05/19/2013   MCV 91.6 05/19/2013   PLT 275 05/19/2013       Component Value Date/Time   NA 135* 05/19/2013 0325   K 4.7 05/19/2013 0325   CL 96 05/19/2013 0325   CO2 23 05/19/2013 0325   GLUCOSE 116* 05/19/2013 0325   BUN 52* 05/19/2013 0325   CREATININE 2.01* 05/19/2013 0325   CALCIUM 8.4 05/19/2013 0325   GFRNONAA 21* 05/19/2013 0325   GFRAA 25* 05/19/2013 0325    Lab Results  Component Value Date   INR 1.28 05/19/2013   INR 1.20 05/18/2013    Assessment/Plan:  s/p IM nail right hip fracture, stable from ortho standpoint NWB on right LE, mobilization once cleared to do so by cards.  Will follow  Madeline Perez. Madeline Patrick, MD 05/19/2013 7:14 AM

## 2013-05-19 NOTE — Progress Notes (Signed)
Pre-op Cardiac Surgery  Carotid Findings:  Bilateral:  1-39% ICA stenosis.  Vertebral artery flow is antegrade.      Upper Extremity Right Left  Brachial Pressures 110 115  Radial Waveforms Tri Tri  Ulnar Waveforms Tri Tri  Palmar Arch (Allen's Test) Decreases with radial compression, but not >50%. Normal with ulnar compression Decreases with radial compression, but not >50%. Normal with ulnar compression   Findings:      Lower  Extremity Right Left  Dorsalis Pedis    Anterior Tibial 142 160  Posterior Tibial 160 300  Ankle/Brachial Indices fasely elevated Non-compressible   Findings:  ABIs are most likely falsely elevated due to calcified vessels. Left PT is non compressible.   Farrel Demark, RDMS, RVT 05/19/2013

## 2013-05-19 NOTE — Progress Notes (Signed)
Pt vomited approximately 5 cc of clear emesis post water consumption. RN gave pt PRN zofran. Pt verbalized relief. Pt resting and call bell within reach. Will continue to monitor.   Genevive Bi, RN

## 2013-05-19 NOTE — Care Management Note (Unsigned)
    Page 1 of 1   05/19/2013     2:52:44 PM   CARE MANAGEMENT NOTE 05/19/2013  Patient:  Madeline Perez, Madeline Perez   Account Number:  [id number]  Date Initiated:  05/19/2013  Documentation initiated by:  Vivi Piccirilli  Subjective/Objective Assessment:   PT ADM ON 3/24 WITH NSTEMI, CAD.  PTA, PT RESIDED AT [facility] WHERE SHE WAS RECEIVING REHAB FOR RECENT HIP FX.     Action/Plan:   WILL CONSULT CSW TO FACILITATE RETURN TO SNF WHEN MEDICALLY STABLE.  WILL FOLLOW PROGRESS.   Anticipated DC Date:  05/22/2013   Anticipated DC Plan:  SKILLED NURSING FACILITY  In-house referral  Clinical Social Worker      DC Planning Services  CM consult      Choice offered to / List presented to:             Status of service:  In process, will continue to follow Medicare Important Message given?   (If response is "NO", the following Medicare IM given date fields will be blank) Date Medicare IM given:   Date Additional Medicare IM given:    Discharge Disposition:    Per UR Regulation:  Reviewed for med. necessity/level of care/duration of stay  If discussed at Long Length of Stay Meetings, dates discussed:    Comments:

## 2013-05-19 NOTE — Progress Notes (Signed)
Echo Lab  2D Echocardiogram completed.  Quentina Fronek L Milianna Ericsson, RDCS 05/19/2013 9:58 AM

## 2013-05-19 NOTE — Progress Notes (Signed)
ANTICOAGULATION CONSULT NOTE - follow up Pharmacy Consult for Heparin Indication: CAD s/p cath, possible OHS  No Known Allergies  Patient Measurements: Height: 5\' 4"  (162.6 cm) Weight: 156 lb 8.4 oz (71 kg) IBW/kg (Calculated) : 54.7 Heparin Dosing Weight: 70 kg  Vital Signs: Temp: 98.5 F (36.9 C) (03/26 0402) Temp src: Oral (03/26 0402) BP: 103/53 mmHg (03/26 0402) Pulse Rate: 79 (03/26 0402)  Labs:  Recent Labs  05/18/13 0300 05/18/13 0815 05/19/13 0325  HGB  --  7.6* 9.9*  HCT  --  22.7* 29.3*  PLT  --  221 275  LABPROT  --  14.9 15.7*  INR  --  1.20 1.28  HEPARINUNFRC 0.27* 0.39 0.40  CREATININE 1.43*  --  2.01*    Estimated Creatinine Clearance: 19.1 ml/min (by C-G formula based on Cr of 2.01).    Assessment: Madeline Perez on chronic coumadin continues on IV heparin while coumadin is on hold. Heparin level remains therapeutic at 0.4. INR is 1.28 and H/H improved after transfusion. Plts remains WNL. No bleeding noted.   Goal of Therapy:  Heparin level 0.3-0.7 units/ml Monitor platelets by anticoagulation protocol: Yes   Plan:  1. Continue heparin at rate of 950 units/hr. 2. Daily heparin level and CBC. 3. F/u plans for surgery vs restarting coumadin  Lysle Pearl, PharmD, BCPS Pager # [phone] 05/19/2013 8:29 AM

## 2013-05-19 NOTE — Progress Notes (Signed)
Pt vomited after taking po medication and jello. Pt vomited the jello back up. Pt was relieved. Pt resting with call bell within reach. Will continue to monitor.  Genevive Bi, RN

## 2013-05-19 NOTE — Progress Notes (Addendum)
Patient Name: Madeline Perez Date of Encounter: 05/19/2013  Principal Problem:   NSTEMI (non-ST elevated myocardial infarction) Active Problems:   Coronary atherosclerosis of native coronary artery   Aortic valve stenosis   Chronic diastolic CHF (congestive heart failure)   Atrial fibrillation   Chronic kidney disease   Intertrochanteric fracture of right hip   Compression fracture of lumbar spine, non-traumatic    SUBJECTIVE: + SOB, no chest pain, N&V this am, could not keep water down. Very constipated, hemorrhoids are bothering her a great deal. Hip pain pretty well controlled.   Pt says she is thinking seriously about no more procedures and going home, possibly w/ Hospice, to die a natural death. Her son will be in here later today.   OBJECTIVE Filed Vitals:   05/18/13 2358 05/19/13 0102 05/19/13 0402 05/19/13 0439  BP: 101/61 102/57 103/53   Pulse: 71 75 79   Temp: 98.5 F (36.9 C) 98.4 F (36.9 C) 98.5 F (36.9 C)   TempSrc: Oral Oral Oral   Resp: 20 20 20    Height:      Weight:    156 lb 8.4 oz (71 kg)  SpO2: 97% 95% 95%     Intake/Output Summary (Last 24 hours) at 05/19/13 0820 Last data filed at 05/19/13 0520  Gross per 24 hour  Intake 845.83 ml  Output    500 ml  Net 345.83 ml   Filed Weights   04/27/2013 1730 05/18/13 0433 05/19/13 0439  Weight: 153 lb 9.6 oz (69.673 kg) 158 lb 8.2 oz (71.9 kg) 156 lb 8.4 oz (71 kg)    PHYSICAL EXAM General: Well developed, well nourished, female in no acute distress. Head: Normocephalic, atraumatic.  Neck: Supple without bruits, JVD at 10 cm. Lungs:  Resp regular and unlabored, bibasilar crackles. Heart: RRR, S1, S2, no S3, S4, 2+ murmur; no rub. Abdomen: Soft, non-tender, non-distended, BS + x 4.  Extremities: No clubbing, cyanosis, no edema. Incision right hip without drainage, bandage not disturbed. Right groin cath site without ecchymosis/hematoma Neuro: Alert and oriented X 3. Moves all extremities  spontaneously. Psych: Normal affect.  LABS: CBC: Recent Labs  05/18/13 0815 05/19/13 0325  WBC 13.7* 16.7*  HGB 7.6* 9.9*  HCT 22.7* 29.3*  MCV 93.0 91.6  PLT 221 275   INR: Recent Labs  05/19/13 0325  INR 3.36   Basic Metabolic Panel: Recent Labs  04/26/2013 2033 05/18/13 0300 05/19/13 0325  NA  --  135* 135*  K  --  4.4 4.7  CL  --  96 96  CO2  --  25 23  GLUCOSE  --  122* 116*  BUN  --  38* 52*  CREATININE  --  1.43* 2.01*  CALCIUM  --  8.2* 8.4  MG 1.9  --   --    Fasting Lipid Panel: Recent Labs  05/18/13 0815  CHOL 124  HDL 41  LDLCALC 64  TRIG 96  CHOLHDL 3.0   Thyroid Function Tests: Recent Labs  05/11/2013 2033  TSH 0.818   TELE:  SR, 3 bts NSVT     Radiology/Studies: Dg Pelvis Portable 05/18/2013   CLINICAL DATA:  Postop right hip ORIF  EXAM: PORTABLE PELVIS 1-2 VIEWS  COMPARISON:  None.  FINDINGS: Right proximal femoral intra medullary nail with 2 cannulated interlocking screws transfixing a comminuted intertrochanteric fracture with medial displacement of the lesser trochanter. The alignment is near anatomic. There is no other fracture or dislocation.  IMPRESSION: ORIF right  intertrochanteric fracture.   Electronically Signed   By: Kathreen Devoid   On: 05/18/2013 15:58   Dg Hip Portable 1 View Right 05/18/2013   CLINICAL DATA:  ORIF right hip  EXAM: PORTABLE RIGHT HIP - 1 VIEW  COMPARISON:  None.  FINDINGS: Right proximal femoral intra medullary nail with 2 cannulated interlocking screws transfixing a comminuted right intertrochanteric fracture with medial displacement of the lesser trochanter. The overall alignment is near anatomic. There is no dislocation.  IMPRESSION: ORIF right intertrochanteric fracture.   Electronically Signed   By: Kathreen Devoid   On: 05/18/2013 16:06     Current Medications:  . aspirin EC  325 mg Oral Daily  . atorvastatin  20 mg Oral q1800  . ferrous sulfate  325 mg Oral BID WC  . furosemide  40 mg Oral Daily  . lactose  free nutrition  237 mL Oral TID WC  . metoprolol succinate  50 mg Oral Daily  . potassium chloride  20 mEq Oral Daily  . pramipexole  0.25 mg Oral Daily  . propafenone  225 mg Oral 3 times per day  . sodium chloride  3 mL Intravenous Q12H   . heparin 950 Units/hr (05/18/13 2248)    ASSESSMENT AND PLAN: Principal Problem:   NSTEMI (non-ST elevated myocardial infarction) - medical therapy for now w/ ASA, statin, BB. EF preserved by echo 02/2013; MD to discuss w/ pt and son, final decision on plan at that time.  Active Problems:   Coronary atherosclerosis of native coronary artery - see above    Aortic valve stenosis - med mgt for now,  Will hold Lasix for now with elevation in Cr. Will likely need IV Rx later    Chronic diastolic CHF (congestive heart failure) - will ck CXR    Atrial fibrillation - maintaining SR on Rhythmol and Toprol XL    Chronic kidney disease - BUN/Cr higher today, may be from dye load. Follow O2 sats and resp status    Intertrochanteric fracture of right hip - see ortho note, NWB RLE, but can increase activity when cleared by cards    Compression fracture of lumbar spine, non-traumatic - prn Rx    Constipation, hemorrhoid pain - add Colace BID, Dulcolax supp and PRN enemas. Daily Miralax. Discussed that we do not want her straining at bowel.    NCB - pt considering comfort care. She will discuss w/ son and make final decision. Call Hospice if that is her wish.  Signed, Rosaria Ferries , PA-C 8:20 AM 05/19/2013   I have personally seen and examined this patient with Rosaria Ferries, PA-C. I agree with the assessment and plan as outlined above.  1. NSTEMI/CAD: Pt found to have severe multivessel CAD by cath 05/01/2013 at [facility]. Her LAD has severe calcified stenosis at the bifurcation with the Diagonal branch which is moderate in caliber. Both OM branches are moderate in caliber with moderately severe disease and the dominant RCA has severe calcific proximal to mid  stenosis. She has had no angina at home but ruled in for MI post-op. She clearly needs revascularization but she will be very high risk for CABG with AVR. CT surgery has seen her and I have had a long talk with Dr. Roxy Manns. She is not a candidate for CABG or traditional AVR. The question really is if her CAD needs to be addressed at all during this admission. I suspect that she has chronic stable severe CAD. No evidence of plaque rupture. While  we can offer rotablator atherectomy of the RCA +/- LAD, she is currently saying she does not want this done.  This would be a high risk PCI in elderly pt with anemia, severe AS, recent hip fracture with renal insufficiency. For now, continue full-dose ASA, BB, statin, NTG SL PRN. Stop heparin infusion. I will discuss later with her son. 2. Severe Aortic stenosis: Echo pending today to assess aortic valve and LVEF. I have had a long discussion with her daughter and son about traditional AVR vs TAVR. She has also met with Dr. Roxy Manns with CT surgery. She is not a candidate for traditional AVR. She could be a candidate for TAVR in the future if she chose to do this.  3. Chronic diastolic CHF: Mild volume overload but renal function has worsened. Will hold Lasix today.  Monitor daily weights  4. PAF: maintaining NSR on propafenone. Coumadin held in the setting of recent procedure and potential need for repeat cath . Continue heparin. Continue propafenone  5. R hip fracture s/p ORIF 05/12/2013:Appreciate ortho input. OK to mobilize. PT consult.  6. CKD, stage III: Mild worsening or renal function. Hold Lasix. Follow. This could be contrast induced nephropathy.  7. Post-op ABL anemia: Hgb stable post transfusion.   Margaret Cockerill 05/19/2013 9:19 AM

## 2013-05-19 NOTE — Progress Notes (Addendum)
I am covering for portion of unit for unit CSW today. Received a handoff from unit CSW stating pt is from [facility] independent living and would like to go to their SNF for rehab upon discharge. Unit CSW sent clinicals to facility and asked me to call to verify this plan would be appropriate. I called facility and left a voicemail for rehab admissions requesting a call back confirming this care plan is acceptable.  AddendumShona Perez can take pt for rehab when she is medically ready for discharge; received call from admissions with facility stating they are ready for her.  Maryclare Labrador, MSW, [facility] Clinical Social Worker [phone]

## 2013-05-20 ENCOUNTER — Encounter (HOSPITAL_COMMUNITY): Payer: Medicare Other

## 2013-05-20 LAB — BASIC METABOLIC PANEL
BUN: 61 mg/dL — ABNORMAL HIGH (ref 6–23)
CHLORIDE: 95 meq/L — AB (ref 96–112)
CO2: 23 mEq/L (ref 19–32)
Calcium: 8.1 mg/dL — ABNORMAL LOW (ref 8.4–10.5)
Creatinine, Ser: 2.18 mg/dL — ABNORMAL HIGH (ref 0.50–1.10)
GFR calc non Af Amer: 19 mL/min — ABNORMAL LOW (ref >90)
GFR, EST AFRICAN AMERICAN: 22 mL/min — AB (ref >90)
Glucose, Bld: 103 mg/dL — ABNORMAL HIGH (ref 70–99)
POTASSIUM: 4.6 meq/L (ref 3.7–5.3)
SODIUM: 135 meq/L — AB (ref 137–147)

## 2013-05-20 LAB — CBC
HCT: 27.8 % — ABNORMAL LOW (ref 36.0–46.0)
HEMOGLOBIN: 9.4 g/dL — AB (ref 12.0–15.0)
MCH: 31.2 pg (ref 26.0–34.0)
MCHC: 33.8 g/dL (ref 30.0–36.0)
MCV: 92.4 fL (ref 78.0–100.0)
Platelets: 274 10*3/uL (ref 150–400)
RBC: 3.01 MIL/uL — AB (ref 3.87–5.11)
RDW: 15.6 % — ABNORMAL HIGH (ref 11.5–15.5)
WBC: 13.9 10*3/uL — AB (ref 4.0–10.5)

## 2013-05-20 LAB — HEPARIN LEVEL (UNFRACTIONATED): Heparin Unfractionated: 0.31 IU/mL (ref 0.30–0.70)

## 2013-05-20 LAB — PROTIME-INR
INR: 1.38 (ref 0.00–1.49)
PROTHROMBIN TIME: 16.6 s — AB (ref 11.6–15.2)

## 2013-05-20 MED ORDER — WARFARIN - PHARMACIST DOSING INPATIENT
Freq: Every day | Status: DC
  Administered 2013-05-22: 18:00:00

## 2013-05-20 MED ORDER — WARFARIN SODIUM 4 MG PO TABS
4.0000 mg | ORAL_TABLET | Freq: Once | ORAL | Status: AC
  Administered 2013-05-20: 4 mg via ORAL
  Filled 2013-05-20: qty 1

## 2013-05-20 MED ORDER — PRAMIPEXOLE DIHYDROCHLORIDE 0.25 MG PO TABS
0.2500 mg | ORAL_TABLET | Freq: Every day | ORAL | Status: DC
  Administered 2013-05-21 – 2013-05-22 (×3): 0.25 mg via ORAL
  Filled 2013-05-20 (×5): qty 1

## 2013-05-20 MED ORDER — HEPARIN SODIUM (PORCINE) 5000 UNIT/ML IJ SOLN
5000.0000 [IU] | Freq: Three times a day (TID) | INTRAMUSCULAR | Status: DC
  Administered 2013-05-20 – 2013-05-23 (×9): 5000 [IU] via SUBCUTANEOUS
  Filled 2013-05-20 (×12): qty 1

## 2013-05-20 NOTE — Progress Notes (Signed)
Chaplain received a request to visit the patient. Chaplain offered ministry of presence, emotional and spiritual support. Patient said a chaplain visited her on yesterday.   05/20/13 1200  Clinical Encounter Type  Visited With Patient  Visit Type Initial

## 2013-05-20 NOTE — Progress Notes (Signed)
PM Cardiology Note:   See full note this am for plan. I spoke to her son on the phone this am and outlined the plan and he is in agreement. She has many questions this afternoon so I came back by to discuss these issues. She wants TPN since she is feeling nauseous but I told her that this was not indicated at this time. She wants to discuss Hospice but I think at this time it is reasonable to still consider SNF to rehab from her hip and she agrees with this plan. She questions if she has a bowel obstruction but she is having bowel movements.   Continue current plan. Conservative management of 3V CAD and moderate AS. SNF next week.   MCALHANY,CHRISTOPHER 05/20/2013 4:15 PM

## 2013-05-20 NOTE — Discharge Instructions (Signed)
NWB on the left LE until follow up with Dr Kennedy Bucker with [facility]

## 2013-05-20 NOTE — Progress Notes (Signed)
Informed by [facility] that pt likely discharging Monday or Tuesday. Called [facility] to give them this update. Sent progress notes at facility's request. CSW will continue to follow case to provide support to pt/family and assist with discharge.   Maryclare Labrador, MSW, [facility] Clinical Social Worker [phone]

## 2013-05-20 NOTE — Progress Notes (Signed)
Patient expressed concern this morning about wanting to talk to the doctor. Patient stated that she felt that she was going to die today and wants to discuss hospice as well. Paged Clarisa Schools, PA and she stated that Dr, Sanjuana Kava wanted to wait and talk to patient when family was available today. Patient also requested to take her Mirapex in the evening and spoke with the pharmacist to get that changed. Will continue to monitor closely. Lajuana Matte, RN

## 2013-05-20 NOTE — Progress Notes (Signed)
During the morning med pass pt. stated having hard time taking meds due to nausea. She expressed that she thinks it's her time to die and after talking with her she feels like hospice could be beneficial. Chaplin came by to talk to her but since she hadn't asked for the chaplin she didn't know what the chaplin could do for her. She expressed wanting to speak more with her doctor but did not want to inconvenience him. Discussed with patient about being her own advocate. Made Dina RN aware.  Jheremy Boger,T, Student-RN

## 2013-05-20 NOTE — Progress Notes (Signed)
Subjective: Denies CP/SOB. N/V has resolved, but still with decreased appetite. She does not want any more procedures. She wants to work towards being discharged to a temporary rehab facility to build strength, before going home.   Objective: Vital signs in last 24 hours: Temp:  [98.1 F (36.7 C)-98.5 F (36.9 C)] 98.1 F (36.7 C) (03/27 0518) Pulse Rate:  [68-85] 74 (03/27 0518) Resp:  [18-20] 20 (03/27 0518) BP: (98-110)/(49-62) 98/52 mmHg (03/27 0518) SpO2:  [90 %-95 %] 90 % (03/27 0518) Weight:  [157 lb 6.5 oz (71.4 kg)-158 lb 15.2 oz (72.1 kg)] 158 lb 15.2 oz (72.1 kg) (03/27 0518) Last BM Date: 05/19/13  Intake/Output from previous day: 03/26 0701 - 03/27 0700 In: 360 [P.O.:360] Out: 800 [Urine:800] Intake/Output this shift:    Medications Current Facility-Administered Medications  Medication Dose Route Frequency Provider Last Rate Last Dose  . 0.9 %  sodium chloride infusion  250 mL Intravenous PRN Roger A Arguello, PA-C      . acetaminophen (TYLENOL) tablet 650 mg  650 mg Oral Q4H PRN Gery Pray, PA-C   650 mg at May 21, 2013 2048  . albuterol (PROVENTIL) (2.5 MG/3ML) 0.083% nebulizer solution 2.5 mg  2.5 mg Nebulization Once Purcell Nails, MD      . aspirin EC tablet 325 mg  325 mg Oral Daily Roger A Arguello, PA-C   325 mg at 05/19/13 1057  . atorvastatin (LIPITOR) tablet 20 mg  20 mg Oral q1800 Roger A Arguello, PA-C   20 mg at 05/19/13 1710  . bisacodyl (DULCOLAX) suppository 10 mg  10 mg Rectal Daily PRN Joline Salt Barrett, PA-C   10 mg at 05/19/13 1116  . docusate sodium (COLACE) capsule 200 mg  200 mg Oral BID Joline Salt Barrett, PA-C   200 mg at 05/19/13 2231  . furosemide (LASIX) tablet 40 mg  40 mg Oral Daily Rhonda G Barrett, PA-C      . Gerhardt's butt cream   Topical BID Rhonda G Barrett, PA-C      . heparin ADULT infusion 100 units/mL (25000 units/250 mL)  950 Units/hr Intravenous Continuous Drake Leach Rumbarger, RPH 9.5 mL/hr at 05/20/13 0354 950  Units/hr at 05/20/13 0354  . HYDROcodone-acetaminophen (NORCO/VICODIN) 5-325 MG per tablet 1 tablet  1 tablet Oral Q6H PRN Gery Pray, PA-C   1 tablet at 05/19/13 1832  . lactose free nutrition (BOOST PLUS) liquid 237 mL  237 mL Oral TID WC Nada Boozer, NP   237 mL at 05/19/13 1630  . lactulose (CHRONULAC) 10 GM/15ML solution 20 g  20 g Oral BID PRN Gery Pray, PA-C   20 g at 05/18/13 1843  . metoprolol succinate (TOPROL-XL) 24 hr tablet 50 mg  50 mg Oral Daily Roger A Arguello, PA-C   50 mg at 05/19/13 1057  . nitroGLYCERIN (NITROSTAT) SL tablet 0.4 mg  0.4 mg Sublingual Q5 Min x 3 PRN Roger A Arguello, PA-C   0.4 mg at 05/18/13 1353  . ondansetron (ZOFRAN) injection 4 mg  4 mg Intravenous Q6H PRN Roger A Arguello, PA-C   4 mg at 05/19/13 1104  . polyethylene glycol (MIRALAX / GLYCOLAX) packet 17 g  17 g Oral Daily Rhonda G Barrett, PA-C   17 g at 05/19/13 1000  . potassium chloride SA (K-DUR,KLOR-CON) CR tablet 20 mEq  20 mEq Oral Daily Rhonda G Barrett, PA-C      . pramipexole (MIRAPEX) tablet 0.25 mg  0.25 mg Oral Daily Roger  A Arguello, PA-C   0.25 mg at 05/19/13 1057  . propafenone (RYTHMOL) tablet 225 mg  225 mg Oral 3 times per day Gery Prayoger A Arguello, PA-C   225 mg at 05/20/13 [id number]  . sodium chloride 0.9 % injection 3 mL  3 mL Intravenous Q12H Roger A Arguello, PA-C   3 mL at 05/19/13 2232  . sodium chloride 0.9 % injection 3 mL  3 mL Intravenous PRN Gery Prayoger A Arguello, PA-C        PE: General appearance: alert, cooperative and no distress Lungs: clear to auscultation bilaterally Heart: regular rate and rhythm and 3/6 SM  Extremities: no LEE Pulses: 2+ and symmetric Skin: warm and dry Neurologic: Grossly normal  Lab Results:   Recent Labs  05/18/13 0815 05/19/13 0325 05/20/13 0237  WBC 13.7* 16.7* 13.9*  HGB 7.6* 9.9* 9.4*  HCT 22.7* 29.3* 27.8*  PLT 221 275 274   BMET  Recent Labs  05/18/13 0300 05/19/13 0325 05/20/13 0237  NA 135* 135* 135*  K 4.4 4.7  4.6  CL 96 96 95*  CO2 25 23 23   GLUCOSE 122* 116* 103*  BUN 38* 52* 61*  CREATININE 1.43* 2.01* 2.18*  CALCIUM 8.2* 8.4 8.1*   PT/INR  Recent Labs  05/18/13 0815 05/19/13 0325 05/20/13 0237  LABPROT 14.9 15.7* 16.6*  INR 1.20 1.28 1.38   Cholesterol  Recent Labs  05/18/13 0815  CHOL 124    Studies/Results:  2D echo 05/19/13  Study Conclusions  - Left ventricle: The cavity size was normal. Wall thickness was increased in a pattern of mild LVH. Systolic function was mildly to moderately reduced. The estimated ejection fraction was in the range of 40% to 45%. Probable moderate hypokinesis of the inferior myocardium. Doppler parameters are consistent with abnormal left ventricular relaxation (grade 1 diastolic dysfunction). - Aortic valve: There was moderate to severe stenosis. Trivial regurgitation. Valve area: 0.94cm^2(VTI). Valve area: 0.85cm^2 (Vmax). - Mitral valve: Calcified annulus. Mild regurgitation. - Left atrium: The atrium was moderately dilated. - Tricuspid valve: Moderate regurgitation.    Assessment/Plan  Principal Problem:   NSTEMI (non-ST elevated myocardial infarction) Active Problems:   Coronary atherosclerosis of native coronary artery   Aortic valve stenosis   Chronic diastolic CHF (congestive heart failure)   Atrial fibrillation   Chronic kidney disease   Intertrochanteric fracture of right hip   Compression fracture of lumbar spine, non-traumatic  Plan: 1. NSTEMI/CAD:  She denies CP/SOB. At this time, she wishes not to undergo any more procedures. For now, continue full-dose ASA, BB, statin, NTG SL PRN.   2. Severe Aortic stenosis: 2D echo yesterday demonstrated moderate to severe AS with valve area of 0.94 cm^2 (VTI). Valve area 0.85 cm ^ (Vmax). Mean gradient is 30mmHg. She could be a candidate for TAVR in the future if she chose to do this but based on current gradient, she does not have severe AS.    3. Chronic diastolic CHF: EF  of 40-45%. Mild volume overload but renal function has worsened. Weight has drifted up 1 lb a day for the last 2 days. Her lasix has been on hold. ? Giving a dose today.    4. PAF: maintaining NSR on propafenone. Can restart Coumadin today, as patient has opted against any further procedures. Continue heparin until INR is therapeutic.  Continue propafenone   5. R hip fracture s/p ORIF 05/12/2013:Appreciate ortho input. OK to mobilize. PT consult.   6. CKD, stage III: Mild worsening of renal function.  Scr is 2.18. This could be contrast induced nephropathy.  Continue to monitor.  7. Post-op ABL anemia: Hgb stable post transfusion.    MD to follow with further recommendations.    LOS: 3 days    Madeline Perez Islam 05/20/2013 8:35 AM  I have personally seen and examined this patient with Robbie Lis PA-C. I agree with the assessment and plan as outlined above. She is stable this am. She is slightly more dyspneic so will resume Lasix. I suspect her renal insufficiency is due to contrast nephropathy post cath at [facility]. She has severe calcific CAD by cath at [facility] and moderate AS by echo 05/19/13 (mean gradient 31mm Hg). After long discussion with pt and family over last few days, she has opted for conservative therapy with medical management of CAD only. She will be restarted on coumadin today as no further invasive procedures are planned. Will d/c heparin and allow her INR to come up with coumadin. She will be given heparin SQ for DVT prophylaxis. Continue progression on left hip. Will need SNF next week. This is being planned. Her long term cardiac follow up will be with Dr. Mariah Milling in the [place] office. If she develops angina that is not controlled with medical therapy, could consider complex PCI with rotablator atherectomy of the RCA and the LAD. Will need Ace-inh as part of her CHF/cardiomyopathy therapy when renal function normalizes.   Dailen Mcclish 05/20/2013 9:32  AM

## 2013-05-20 NOTE — Progress Notes (Addendum)
ANTICOAGULATION CONSULT NOTE - follow up Pharmacy Consult for Heparin/Warfarin Indication: CAD s/p cath, possible OHS, AFib  No Known Allergies  Patient Measurements: Height: 5\' 4"  (162.6 cm) Weight: 158 lb 15.2 oz (72.1 kg) IBW/kg (Calculated) : 54.7 Heparin Dosing Weight: 70 kg  Vital Signs: Temp: 98.1 F (36.7 C) (03/27 0518) Temp src: Oral (03/27 0518) BP: 98/52 mmHg (03/27 0518) Pulse Rate: 74 (03/27 0518)  Labs:  Recent Labs  05/18/13 0300  05/18/13 0815 05/19/13 0325 05/20/13 0237  HGB  --   < > 7.6* 9.9* 9.4*  HCT  --   --  22.7* 29.3* 27.8*  PLT  --   --  221 275 274  LABPROT  --   --  14.9 15.7* 16.6*  INR  --   --  1.20 1.28 1.38  HEPARINUNFRC 0.27*  --  0.39 0.40 0.31  CREATININE 1.43*  --   --  2.01* 2.18*  < > = values in this interval not displayed.  Estimated Creatinine Clearance: 17.7 ml/min (by C-G formula based on Cr of 2.18).   Assessment: 89 yof on chronic coumadin continues on IV heparin while coumadin is on hold. Heparin level is therapeutic, but at the low end of therapeutic range. INR is 1.38 and H/H improved after transfusion. Plts remains WNL. No bleeding noted.   Goal of Therapy:  Heparin level 0.3-0.7 units/ml Monitor platelets by anticoagulation protocol: Yes   Plan:  1. Continue heparin at rate of 950 units/hr. 2. Daily heparin level and CBC. 3. F/u plans for surgery vs restarting coumadin  Agapito Games, PharmD, BCPS Clinical Pharmacist Pager: [phone] 05/20/2013 8:39 AM   Update No further invasive procedures planned Heparin gtt discontinued Re-initiating warfarin PTA warfarin dose: 2 mg po daily  Plan Warfarin 4 mg po x1 Daily INR F/u dc heparin sq when INR > 2  Agapito Games, PharmD, BCPS Clinical Pharmacist Pager: [phone] 05/20/2013 9:56 AM

## 2013-05-20 NOTE — Progress Notes (Signed)
Orthopedics Progress Note  Subjective: I feel weak.  Objective:  Filed Vitals:   05/20/13 1207  BP: 106/60  Pulse: 83  Temp: 98.2 F (36.8 C)  Resp: 20    General: Awake and alert  Musculoskeletal: right hip dressing changed , wounds look great, no erythema and no drainage Neurovascularly intact  Lab Results  Component Value Date   WBC 13.9* 05/20/2013   HGB 9.4* 05/20/2013   HCT 27.8* 05/20/2013   MCV 92.4 05/20/2013   PLT 274 05/20/2013       Component Value Date/Time   NA 135* 05/20/2013 0237   K 4.6 05/20/2013 0237   CL 95* 05/20/2013 0237   CO2 23 05/20/2013 0237   GLUCOSE 103* 05/20/2013 0237   BUN 61* 05/20/2013 0237   CREATININE 2.18* 05/20/2013 0237   CALCIUM 8.1* 05/20/2013 0237   GFRNONAA 19* 05/20/2013 0237   GFRAA 22* 05/20/2013 0237    Lab Results  Component Value Date   INR 1.38 05/20/2013   INR 1.28 05/19/2013   INR 1.20 05/18/2013    Assessment/Plan:  s/p IM Nail right hip fracture Continue NWB, UE ROM and LE ROM with PT and nursing Will need long term SNF once stable Call for any questions  Viviann Spare R. Ranell Patrick, MD 05/20/2013 1:55 PM

## 2013-05-21 LAB — CBC
HCT: 29.8 % — ABNORMAL LOW (ref 36.0–46.0)
Hemoglobin: 9.9 g/dL — ABNORMAL LOW (ref 12.0–15.0)
MCH: 30.8 pg (ref 26.0–34.0)
MCHC: 33.2 g/dL (ref 30.0–36.0)
MCV: 92.8 fL (ref 78.0–100.0)
Platelets: 304 10*3/uL (ref 150–400)
RBC: 3.21 MIL/uL — ABNORMAL LOW (ref 3.87–5.11)
RDW: 15.7 % — AB (ref 11.5–15.5)
WBC: 14.1 10*3/uL — AB (ref 4.0–10.5)

## 2013-05-21 LAB — BASIC METABOLIC PANEL
BUN: 69 mg/dL — ABNORMAL HIGH (ref 6–23)
CO2: 24 mEq/L (ref 19–32)
Calcium: 8.3 mg/dL — ABNORMAL LOW (ref 8.4–10.5)
Chloride: 95 mEq/L — ABNORMAL LOW (ref 96–112)
Creatinine, Ser: 2.2 mg/dL — ABNORMAL HIGH (ref 0.50–1.10)
GFR calc Af Amer: 22 mL/min — ABNORMAL LOW (ref >90)
GFR, EST NON AFRICAN AMERICAN: 19 mL/min — AB (ref >90)
Glucose, Bld: 106 mg/dL — ABNORMAL HIGH (ref 70–99)
POTASSIUM: 4.5 meq/L (ref 3.7–5.3)
SODIUM: 134 meq/L — AB (ref 137–147)

## 2013-05-21 LAB — PROTIME-INR
INR: 1.45 (ref 0.00–1.49)
PROTHROMBIN TIME: 17.3 s — AB (ref 11.6–15.2)

## 2013-05-21 MED ORDER — WARFARIN SODIUM 4 MG PO TABS
4.0000 mg | ORAL_TABLET | Freq: Once | ORAL | Status: AC
  Administered 2013-05-21: 4 mg via ORAL
  Filled 2013-05-21: qty 1

## 2013-05-21 NOTE — Progress Notes (Signed)
ANTICOAGULATION CONSULT NOTE - follow up Pharmacy Consult for Warfarin Indication: AFib  No Known Allergies  Patient Measurements: Height: 5\' 4"  (162.6 cm) Weight: 159 lb 6.3 oz (72.3 kg) IBW/kg (Calculated) : 54.7 Heparin Dosing Weight: 70 kg  Vital Signs: Temp: 98.3 F (36.8 C) (03/28 0300) Temp src: Oral (03/28 0300) BP: 96/53 mmHg (03/28 0300) Pulse Rate: 70 (03/28 0300)  Labs:  Recent Labs  05/19/13 0325 05/20/13 0237 05/21/13 0400  HGB 9.9* 9.4* 9.9*  HCT 29.3* 27.8* 29.8*  PLT 275 274 304  LABPROT 15.7* 16.6* 17.3*  INR 1.28 1.38 1.45  HEPARINUNFRC 0.40 0.31  --   CREATININE 2.01* 2.18* 2.20*    Estimated Creatinine Clearance: 17.5 ml/min (by C-G formula based on Cr of 2.2).   Assessment: 89 yof on chronic coumadin for afib. Originally was on IV heparin for possible OHS, now plan is for conservative management of 3V CAD, coumadin resumed last night after d/c IV heparin. INR is 1.45. CBC stable, No bleeding noted per chart. Poor po intate.  PTA dose 2mg  daily  Goal of Therapy:  INR 2-3  Monitor platelets by anticoagulation protocol: Yes  Plan Warfarin 4 mg po x1 Daily INR F/u dc heparin sq when INR > 2  Bayard Hugger, PharmD, BCPS  Clinical Pharmacist  Pager: [phone]   05/21/2013 1:27 PM

## 2013-05-22 LAB — PULMONARY FUNCTION TEST
FEF 25-75 Pre: 0.43 L/sec
FEF2575-%Pred-Pre: 41 %
FEV1-%PRED-PRE: 41 %
FEV1-PRE: 0.7 L
FEV1FVC-%Pred-Pre: 94 %
FEV6-%PRED-PRE: 48 %
FEV6-Pre: 1.03 L
FEV6FVC-%PRED-PRE: 107 %
FVC-%PRED-PRE: 45 %
FVC-Pre: 1.03 L
Pre FEV1/FVC ratio: 68 %
Pre FEV6/FVC Ratio: 100 %

## 2013-05-22 LAB — BASIC METABOLIC PANEL
BUN: 68 mg/dL — AB (ref 6–23)
CHLORIDE: 94 meq/L — AB (ref 96–112)
CO2: 22 meq/L (ref 19–32)
CREATININE: 2.11 mg/dL — AB (ref 0.50–1.10)
Calcium: 8.2 mg/dL — ABNORMAL LOW (ref 8.4–10.5)
GFR calc non Af Amer: 20 mL/min — ABNORMAL LOW (ref >90)
GFR, EST AFRICAN AMERICAN: 23 mL/min — AB (ref >90)
GLUCOSE: 116 mg/dL — AB (ref 70–99)
Potassium: 4.4 mEq/L (ref 3.7–5.3)
Sodium: 131 mEq/L — ABNORMAL LOW (ref 137–147)

## 2013-05-22 LAB — PROTIME-INR
INR: 1.71 — AB (ref 0.00–1.49)
PROTHROMBIN TIME: 19.6 s — AB (ref 11.6–15.2)

## 2013-05-22 LAB — OCCULT BLOOD X 1 CARD TO LAB, STOOL: Fecal Occult Bld: NEGATIVE

## 2013-05-22 MED ORDER — METOCLOPRAMIDE HCL 5 MG PO TABS
5.0000 mg | ORAL_TABLET | Freq: Three times a day (TID) | ORAL | Status: DC
  Administered 2013-05-22 – 2013-05-23 (×3): 5 mg via ORAL
  Filled 2013-05-22 (×5): qty 1

## 2013-05-22 MED ORDER — WARFARIN SODIUM 2 MG PO TABS
2.0000 mg | ORAL_TABLET | Freq: Once | ORAL | Status: AC
  Administered 2013-05-22: 2 mg via ORAL
  Filled 2013-05-22: qty 1

## 2013-05-22 NOTE — Progress Notes (Signed)
Subjective: Denies CP/SOB. She has no appetite as she feels nauseous with food. She feels very weak.  Objective: Vital signs in last 24 hours: Temp:  [97.8 F (36.6 C)-98.5 F (36.9 C)] 98.5 F (36.9 C) (03/29 0329) Pulse Rate:  [72-75] 73 (03/29 0329) Resp:  [20] 20 (03/29 0329) BP: (90-108)/(50-56) 102/50 mmHg (03/29 0329) SpO2:  [93 %-96 %] 94 % (03/29 0329) Weight:  [153 lb 10.6 oz (69.7 kg)] 153 lb 10.6 oz (69.7 kg) (03/29 0357) Last BM Date: 05/21/13  Intake/Output from previous day: 03/28 0701 - 03/29 0700 In: 480 [P.O.:480] Out: 1602 [Urine:1600; Stool:2] Intake/Output this shift: Total I/O In: 120 [P.O.:120] Out: 1 [Stool:1]  Medications Current Facility-Administered Medications  Medication Dose Route Frequency Provider Last Rate Last Dose  . 0.9 %  sodium chloride infusion  250 mL Intravenous PRN Roger A Arguello, PA-C      . acetaminophen (TYLENOL) tablet 650 mg  650 mg Oral Q4H PRN Gery Pray, PA-C   650 mg at 2013/05/26 2048  . albuterol (PROVENTIL) (2.5 MG/3ML) 0.083% nebulizer solution 2.5 mg  2.5 mg Nebulization Once Purcell Nails, MD      . aspirin EC tablet 325 mg  325 mg Oral Daily Roger A Arguello, PA-C   325 mg at 05/22/13 1052  . atorvastatin (LIPITOR) tablet 20 mg  20 mg Oral q1800 Roger A Arguello, PA-C   20 mg at 05/21/13 1806  . bisacodyl (DULCOLAX) suppository 10 mg  10 mg Rectal Daily PRN Joline Salt Barrett, PA-C   10 mg at 05/19/13 1116  . docusate sodium (COLACE) capsule 200 mg  200 mg Oral BID Joline Salt Barrett, PA-C   200 mg at 05/20/13 1000  . furosemide (LASIX) tablet 40 mg  40 mg Oral Daily Rhonda G Barrett, PA-C   40 mg at 05/22/13 1052  . Gerhardt's butt cream   Topical BID Rhonda G Barrett, PA-C      . heparin injection 5,000 Units  5,000 Units Subcutaneous 3 times per day Kathleene Hazel, MD   5,000 Units at 05/22/13 [phone]  . HYDROcodone-acetaminophen (NORCO/VICODIN) 5-325 MG per tablet 1 tablet  1 tablet Oral Q6H PRN Gery Pray, PA-C   1 tablet at 05/20/13 1441  . lactose free nutrition (BOOST PLUS) liquid 237 mL  237 mL Oral TID WC Nada Boozer, NP   237 mL at 05/22/13 9604  . lactulose (CHRONULAC) 10 GM/15ML solution 20 g  20 g Oral BID PRN Gery Pray, PA-C   20 g at 05/18/13 1843  . metoprolol succinate (TOPROL-XL) 24 hr tablet 50 mg  50 mg Oral Daily Roger A Arguello, PA-C   50 mg at 05/22/13 1052  . nitroGLYCERIN (NITROSTAT) SL tablet 0.4 mg  0.4 mg Sublingual Q5 Min x 3 PRN Roger A Arguello, PA-C   0.4 mg at 05/18/13 1353  . ondansetron (ZOFRAN) injection 4 mg  4 mg Intravenous Q6H PRN Gery Pray, PA-C   4 mg at 05/20/13 1802  . polyethylene glycol (MIRALAX / GLYCOLAX) packet 17 g  17 g Oral Daily Rhonda G Barrett, PA-C   17 g at 05/19/13 1000  . potassium chloride SA (K-DUR,KLOR-CON) CR tablet 20 mEq  20 mEq Oral Daily Rhonda G Barrett, PA-C   20 mEq at 05/22/13 1052  . pramipexole (MIRAPEX) tablet 0.25 mg  0.25 mg Oral QHS Rollene Rotunda, MD   0.25 mg at 05/21/13 2246  . propafenone (RYTHMOL) tablet 225  mg  225 mg Oral 3 times per day Gery Pray, PA-C   225 mg at 05/22/13 7680  . sodium chloride 0.9 % injection 3 mL  3 mL Intravenous Q12H Roger A Arguello, PA-C   3 mL at 05/21/13 2135  . sodium chloride 0.9 % injection 3 mL  3 mL Intravenous PRN Roger A Arguello, PA-C      . Warfarin - Pharmacist Dosing Inpatient   Does not apply q1800 Rollene Rotunda, MD        PE: General appearance: alert, cooperative and no distress Lungs: clear to auscultation bilaterally Heart: regular rate and rhythm and 3/6 SM  Extremities: no LEE Pulses: 2+ and symmetric Skin: warm and dry Neurologic: Grossly normal  Lab Results:   Recent Labs  05/20/13 0237 05/21/13 0400  WBC 13.9* 14.1*  HGB 9.4* 9.9*  HCT 27.8* 29.8*  PLT 274 304   BMET  Recent Labs  05/20/13 0237 05/21/13 0400 05/22/13 0320  NA 135* 134* 131*  K 4.6 4.5 4.4  CL 95* 95* 94*  CO2 23 24 22   GLUCOSE 103* 106* 116*    BUN 61* 69* 68*  CREATININE 2.18* 2.20* 2.11*  CALCIUM 8.1* 8.3* 8.2*   PT/INR  Recent Labs  05/20/13 0237 05/21/13 0400 05/22/13 0320  LABPROT 16.6* 17.3* 19.6*  INR 1.38 1.45 1.71*   Cholesterol No results found for this basename: CHOL,  in the last 72 hours  Studies/Results:  2D echo 05/19/13  Study Conclusions  - Left ventricle: The cavity size was normal. Wall thickness was increased in a pattern of mild LVH. Systolic function was mildly to moderately reduced. The estimated ejection fraction was in the range of 40% to 45%. Probable moderate hypokinesis of the inferior myocardium. Doppler parameters are consistent with abnormal left ventricular relaxation (grade 1 diastolic dysfunction). - Aortic valve: There was moderate to severe stenosis. Trivial regurgitation. Valve area: 0.94cm^2(VTI). Valve area: 0.85cm^2 (Vmax). - Mitral valve: Calcified annulus. Mild regurgitation. - Left atrium: The atrium was moderately dilated. - Tricuspid valve: Moderate regurgitation.    Assessment/Plan  Principal Problem:   NSTEMI (non-ST elevated myocardial infarction) Active Problems:   Coronary atherosclerosis of native coronary artery   Aortic valve stenosis   Chronic diastolic CHF (congestive heart failure)   Atrial fibrillation   Chronic kidney disease   Intertrochanteric fracture of right hip   Compression fracture of lumbar spine, non-traumatic  Plan:  1. NSTEMI/CAD:  She denies CP/SOB. At this time, she wishes not to undergo any more procedures. For now, continue full-dose ASA, BB, statin, NTG SL PRN.   2. Severe Aortic stenosis: 2D echo yesterday demonstrated moderate to severe AS with valve area of 0.94 cm^2 (VTI). Valve area 0.85 cm ^ (Vmax). Mean gradient is . She could be a candidate for TAVR in the future if she chose to do this but based on current gradient, she does not have severe AS.    Continue current plan. Conservative management of 3V CAD and  moderate AS. SNF next week.   3. Chronic diastolic CHF: EF of 40-45%. Mild volume overload but renal function has worsened. Weight has drifted up 1 lb a day for the last 2 days. Her lasix has been on hold.     4. PAF: maintaining NSR on propafenone. Can restart Coumadin today, as patient has opted against any further procedures. Continue heparin until INR is therapeutic.  Continue propafenone   5. R hip fracture s/p ORIF 05/12/2013:Appreciate ortho input. OK to  mobilize. PT consult.   6. CKD, stage III: Mild worsening of renal function. Scr is 2.18. This could be contrast induced nephropathy.  Continue to monitor. 7. Deconditioning - up to the chair today and physical therapy.   LOS: 5 days    Lars MassonELSON, Irmalee Riemenschneider H 05/22/2013 10:56 AM

## 2013-05-22 NOTE — Progress Notes (Signed)
ANTICOAGULATION CONSULT NOTE - follow up Pharmacy Consult for Warfarin Indication: AFib  No Known Allergies  Patient Measurements: Height: 5\' 4"  (162.6 cm) Weight: 153 lb 10.6 oz (69.7 kg) IBW/kg (Calculated) : 54.7 Heparin Dosing Weight: 70 kg  Vital Signs: Temp: 98.5 F (36.9 C) (03/29 0329) Temp src: Oral (03/29 0329) BP: 102/50 mmHg (03/29 0329) Pulse Rate: 73 (03/29 0329)  Labs:  Recent Labs  05/20/13 0237 05/21/13 0400 05/22/13 0320  HGB 9.4* 9.9*  --   HCT 27.8* 29.8*  --   PLT 274 304  --   LABPROT 16.6* 17.3* 19.6*  INR 1.38 1.45 1.71*  HEPARINUNFRC 0.31  --   --   CREATININE 2.18* 2.20* 2.11*    Estimated Creatinine Clearance: 18 ml/min (by C-G formula based on Cr of 2.11).   Assessment: 89 yof on chronic coumadin for afib. Originally was on IV heparin for possible OHS, now plan is for conservative management of 3V CAD, coumadin resumed on 3/27 after d/c IV heparin. INR is 1.71, trending up. No new CBC, No bleeding noted per chart. Poor po intate.  PTA dose 2mg  daily  Goal of Therapy:  INR 2-3  Monitor platelets by anticoagulation protocol: Yes  Plan Warfarin 2 mg po x1 Daily INR F/u dc heparin sq when INR > 2  Bayard Hugger, PharmD, BCPS  Clinical Pharmacist  Pager: [phone]   05/22/2013 11:54 AM

## 2013-05-23 ENCOUNTER — Other Ambulatory Visit: Payer: Self-pay

## 2013-05-23 ENCOUNTER — Telehealth: Payer: Self-pay | Admitting: Internal Medicine

## 2013-05-23 DIAGNOSIS — S72141A Displaced intertrochanteric fracture of right femur, initial encounter for closed fracture: Secondary | ICD-10-CM | POA: Insufficient documentation

## 2013-05-23 LAB — PROTIME-INR
INR: 2.29 — ABNORMAL HIGH (ref 0.00–1.49)
Prothrombin Time: 24.5 seconds — ABNORMAL HIGH (ref 11.6–15.2)

## 2013-05-23 LAB — BASIC METABOLIC PANEL
BUN: 63 mg/dL — AB (ref 6–23)
CALCIUM: 8.1 mg/dL — AB (ref 8.4–10.5)
CO2: 20 mEq/L (ref 19–32)
Chloride: 97 mEq/L (ref 96–112)
Creatinine, Ser: 2.06 mg/dL — ABNORMAL HIGH (ref 0.50–1.10)
GFR calc Af Amer: 24 mL/min — ABNORMAL LOW (ref >90)
GFR, EST NON AFRICAN AMERICAN: 21 mL/min — AB (ref >90)
Glucose, Bld: 114 mg/dL — ABNORMAL HIGH (ref 70–99)
Potassium: 4.2 mEq/L (ref 3.7–5.3)
Sodium: 135 mEq/L — ABNORMAL LOW (ref 137–147)

## 2013-05-23 MED ORDER — FUROSEMIDE 20 MG PO TABS: 20.0000 mg | ORAL_TABLET | Freq: Every day | ORAL | Status: DC

## 2013-05-23 MED ORDER — METOPROLOL SUCCINATE ER 25 MG PO TB24: 25.0000 mg | ORAL_TABLET | Freq: Every day | ORAL | Status: DC

## 2013-05-23 MED ORDER — MORPHINE SULFATE 2 MG/ML IJ SOLN
2.0000 mg | INTRAMUSCULAR | Status: DC | PRN
  Administered 2013-05-23: 2 mg via INTRAVENOUS
  Filled 2013-05-23 (×2): qty 1
  Filled 2013-05-23: qty 2

## 2013-05-23 MED ORDER — SODIUM CHLORIDE 0.9 % IV SOLN
INTRAVENOUS | Status: DC
  Administered 2013-05-23: 50 mL/h via INTRAVENOUS

## 2013-05-23 MED ORDER — ONDANSETRON 4 MG PO TBDP
4.0000 mg | ORAL_TABLET | Freq: Three times a day (TID) | ORAL | Status: DC | PRN
  Administered 2013-05-23: 4 mg via ORAL
  Filled 2013-05-23: qty 1

## 2013-05-23 MED ORDER — WARFARIN SODIUM 2 MG PO TABS
2.0000 mg | ORAL_TABLET | Freq: Once | ORAL | Status: DC
  Filled 2013-05-23: qty 1

## 2013-05-23 MED ORDER — ASPIRIN EC 81 MG PO TBEC
81.0000 mg | DELAYED_RELEASE_TABLET | Freq: Every day | ORAL | Status: DC
  Filled 2013-05-23: qty 1

## 2013-05-23 MED ORDER — POLYETHYLENE GLYCOL 3350 17 G PO PACK
17.0000 g | PACK | Freq: Every day | ORAL | Status: DC | PRN
Start: 2013-05-23 — End: 2013-05-24
  Filled 2013-05-23: qty 1

## 2013-05-23 MED ORDER — SODIUM CHLORIDE 0.9 % IV BOLUS (SEPSIS)
250.0000 mL | Freq: Once | INTRAVENOUS | Status: AC
  Administered 2013-05-23: 250 mL via INTRAVENOUS

## 2013-05-23 MED ORDER — TRAMADOL HCL 50 MG PO TABS: 50.0000 mg | ORAL_TABLET | Freq: Four times a day (QID) | ORAL | Status: DC | PRN

## 2013-05-23 MED ORDER — DOCUSATE SODIUM 100 MG PO CAPS: 100.0000 mg | ORAL_CAPSULE | Freq: Every day | ORAL | Status: DC

## 2013-05-23 MED ORDER — POTASSIUM CHLORIDE CRYS ER 10 MEQ PO TBCR: 10.0000 meq | EXTENDED_RELEASE_TABLET | Freq: Every day | ORAL | Status: DC

## 2013-05-23 MED ORDER — MORPHINE SULFATE 10 MG/ML IJ SOLN
2.0000 mg/h | INTRAVENOUS | Status: DC
  Filled 2013-05-23: qty 10

## 2013-05-23 NOTE — Progress Notes (Addendum)
Rn contacted Donor Services per protocol.Per [name], pt not appropriate for organ donation due to age. Ref number [id number].   [name],MSN,RN [phone]

## 2013-05-23 NOTE — Progress Notes (Addendum)
Pt. Seen and examined. Agree with the NP/PA-C note as written.  She is desiring full comfort care - she is hypotensive and in significant pain. She is stating that "she is ready to die" and seems to understand her situation.  Her son was present and I spoke with him as well.  They understand that treating her pain may lead to lower blood pressures, respiratory depression and even death.  She does not want any further medical treatment other than pain control at this time. Will indicate she is full "comfort care". Palliative care consultation has been placed, but she has not yet been evaluated. At such time, they may suggest to discontinue other non-comfort medications as well.  Chrystie Nose, MD, [facility] Attending Cardiologist [facility]

## 2013-05-23 NOTE — Progress Notes (Addendum)
Called by RN re: Nausea, chest pain and hypotension. BP 77/39  Pulse 82  Temp(Src) 98.1 F (36.7 C) (Oral)  Resp 18  Ht 5\' 4"  (1.626 m)  Wt 156 lb 12 oz (71.1 kg)  BMI 26.89 kg/m2  SpO2 99%  The patient had been having problems with nausea off and on for several days. Today, the nausea became worse. She became hypotensive with a systolic blood pressure in the 70s. Then she developed chest pain. She had received Lasix 40 mg, Toprol-XL 50 mg, Rythmol 225 mg and Reglan 5 mg less than 2 hours before.  Additionally, she reiterates the desire to avoid further procedures and is considering comfort care.  Mrs. Edward Jolly started agrees to maintain IV access for now. Will give her low dose IV fluids for now as her by mouth intake is poor and she is nauseated. Will use a ODT or IV Zofran for the nausea. She has had loose stools so we'll decrease her bowel regimen. Will decrease metoprolol and Lasix doses, continue to follow volume carefully.  An ECG was performed and shows no significant changes from admission.  A palliative care consult has been called to discuss hospice options. Her son should be present, his name is Arrilla Armato and his phone number is [phone].  She was previously at [facility] assisted living but will explore options for inpatient hospice versus SNF at [facility].   3:44 PM Addendum: IVF has not been helpful and patient requesting pain meds.  BP 66/43  Pulse 91  Temp(Src) 97.4 F (36.3 C) (Axillary)  Resp 20  Ht 5\' 4"  (1.626 m)  Wt 156 lb 12 oz (71.1 kg)  BMI 26.89 kg/m2  SpO2 100%  Situation discussed w/patient and then with son.   She is requesting pain medications, saying she is done with all this and wants to die. She is alert and oriented, seems very clear in her wishes.   Situation was discussed with her son. He agrees that we should honor her wishes and that she is in her right mind. He was advised that pain medications might drop her BP more and will  decrease her level of consciousness. He is aware that it is likely her time is short but she is too unstable to transfer back to [facility].  Will start with low-dose IV morphine and increase as needed for pain control.   She is now full comfort care. Will hold all medications she does not wish to take, minimize BP checks, etc and discontinue blood draws.  Dr. Rennis Golden present and agrees with plan.  Theodore Demark, PA-C Jun 03, 2013 3:48 PM Beeper [phone]

## 2013-05-23 NOTE — Progress Notes (Signed)
Death Note  I was notified ~ 10:30ish pm, Jun 12, 2013 that Ms. Avelyn Blunck had stopped breathing. Ms. Ohrt had recently been made comfort care earlier today in line with her wishes. On my arrival, Ms. Melling had her son and daughter-in-law at her bedside. I looked for respirations, listened for heart sounds and felt for a pulse for 2 minutes without any evidence. Time of death 10:32 pm on 06-12-13. I spoke with Ms. Daws's family and expressed my condolences. Chaplain called and coming to bedside.   Time of death 10:32 Pm on 2013-06-12.  Underlying cause of death: multifactorial: underlying three vessel coronary artery disease and recent hip fracture Immediate cause of death: cardiopulmonary arrest   Leeann Must, MD

## 2013-05-23 NOTE — Progress Notes (Signed)
   SUBJECTIVE:  She is very weak.  No dyspnea.  She denies chest pain.    PHYSICAL EXAM Filed Vitals:   05/22/13 0329 05/22/13 0357 05/22/13 1333 05/06/2013 0455  BP: 102/50  98/52 106/51  Pulse: 73  81 82  Temp: 98.5 F (36.9 C)  98.1 F (36.7 C) 98.1 F (36.7 C)  TempSrc: Oral  Oral Oral  Resp: 20  20 18   Height:      Weight:  153 lb 10.6 oz (69.7 kg)  156 lb 12 oz (71.1 kg)  SpO2: 94%  95% 99%   General:  Very frail Lungs:  Bilateral crackles Heart:  RRR Abdomen:  Positive bowel sounds, no rebound no guarding Extremities:  Trace edema, right hip wound clean and dry. Neuro:  Nonfocal  LABS: No results found for this basename: TROPONINI   Results for orders placed during the hospital encounter of 05/26/2013 (from the past 24 hour(s))  BASIC METABOLIC PANEL     Status: Abnormal   Collection Time    05/19/2013  4:05 AM      Result Value Ref Range   Sodium 135 (*) 137 - 147 mEq/L   Potassium 4.2  3.7 - 5.3 mEq/L   Chloride 97  96 - 112 mEq/L   CO2 20  19 - 32 mEq/L   Glucose, Bld 114 (*) 70 - 99 mg/dL   BUN 63 (*) 6 - 23 mg/dL   Creatinine, Ser 5.03 (*) 0.50 - 1.10 mg/dL   Calcium 8.1 (*) 8.4 - 10.5 mg/dL   GFR calc non Af Amer 21 (*) >90 mL/min   GFR calc Af Amer 24 (*) >90 mL/min  PROTIME-INR     Status: Abnormal   Collection Time    05/12/2013  4:05 AM      Result Value Ref Range   Prothrombin Time 24.5 (*) 11.6 - 15.2 seconds   INR 2.29 (*) 0.00 - 1.49    Intake/Output Summary (Last 24 hours) at 05/07/2013 0745 Last data filed at 05/22/13 1700  Gross per 24 hour  Intake    480 ml  Output    553 ml  Net    -73 ml    ASSESSMENT AND PLAN:  NSTEMI/CAD:  Medical management.  See previous discussions.  AS:  Plan medical management.  AS is not critical/severe.  CHRONIC SYSTOLIC AND DIASTOIC HF:  Continue oral Lasix at current dose.    PAF:  Maintaining NSR.   INR therapeutic.    CKD III:  Creat down slightly.    RIGHT HIP FRACTURE/ORIF:  She is very weak.   Awaiting rehab placement.    Ortho has signed off.   I spoke with nursing and they will talk with PT today.  Her case will be discussed on progression rounds for SNF placement.   Fayrene Fearing [facility] 05/02/2013 7:45 AM

## 2013-05-23 NOTE — Evaluation (Signed)
Physical Therapy Evaluation Patient Details Name: Madeline Perez MRN: [id number] DOB: 10-28-1923 Today's Date: June 19, 2013   History of Present Illness  Admitted to [facility] with mecanical fall and resultant hip fracture  s/p IM nailing on the R.  She also suffered a NSTEMI and was transfered to [facility] for further evaluation.  In the meantime she has been in bed for up to six days without therapy.  Clinical Impression  Pt admitted with problems stated above.  Pt currently limited functionally due to the problems listed. ( See problems list.)   Pt will benefit from PT to maximize function and safety in order to get ready for next venue listed below.     Follow Up Recommendations SNF    Equipment Recommendations  Other (comment) (TBA at [facility])    Recommendations for Other Services       Precautions / Restrictions Precautions Precautions: Fall Precaution Comments: has had a recent compression fx that may cause some complications Restrictions Weight Bearing Restrictions: Yes      Mobility  Bed Mobility Overal bed mobility: Needs Assistance Bed Mobility: Supine to Sit     Supine to sit: Max assist (2 person assist would be helpful, not available)     General bed mobility comments: bridged to EOB with max and need light max truncal assist  Transfers Overall transfer level: Needs assistance Equipment used: None Transfers: Sit to/from [employer] Sit to Stand: Max assist (2 person generally required)   Squat pivot transfers: Max assist     General transfer comment: Needs 2 persons for safety;  pt stood times 4 for pericar and transfered bed to [facility] and [facility] to recliner during course of treatment  Ambulation/Gait                Stairs            Wheelchair Mobility    Modified Rankin (Stroke Patients Only)       Balance Overall balance assessment: Needs assistance Sitting-balance support: Feet supported Sitting balance-Leahy Scale:  Fair Sitting balance - Comments: sits in post pelvic tilt, but maintains balance.   Standing balance support: Bilateral upper extremity supported Standing balance-Leahy Scale: Zero                       Pertinent Vitals/Pain C/o c/p and all over body soreness after the session;  RN notified for pain medicine    Home Living Family/patient expects to be discharged to:: Other (Comment) (Hopefully to [facility])                      Prior Function                 Hand Dominance        Extremity/Trunk Assessment   Upper Extremity Assessment: Defer to OT evaluation (3/30 weak but functional bil)           Lower Extremity Assessment: Generalized weakness;RLE deficits/detail RLE Deficits / Details: stiff with limited aarom; pt hold leg in IR and is tight in ER       Communication   Communication: No difficulties  Cognition Arousal/Alertness: Awake/alert Behavior During Therapy: WFL for tasks assessed/performed Overall Cognitive Status: Within Functional Limits for tasks assessed                      General Comments      Exercises General Exercises - Lower Extremity Ankle Circles/Pumps:  AROM;Both;20 reps Quad Sets: AROM;Both;10 reps;Supine Heel Slides: AAROM;Both;15 reps;Supine Hip ABduction/ADduction: AAROM;Right;10 reps      Assessment/Plan    PT Assessment Patient needs continued PT services  PT Diagnosis Difficulty walking;Generalized weakness;Acute pain   PT Problem List Decreased strength;Decreased range of motion;Decreased activity tolerance;Decreased balance;Decreased mobility;Decreased knowledge of use of DME;Decreased knowledge of precautions;Cardiopulmonary status limiting activity;Pain  PT Treatment Interventions DME instruction;Gait training;Functional mobility training;Therapeutic activities;Patient/family education;Therapeutic exercise   PT Goals (Current goals can be found in the Care Plan section) Acute Rehab PT  Goals Patient Stated Goal: Get back on my feet; I'd like to get back home PT Goal Formulation: With patient/family Time For Goal Achievement: 06/06/13 Potential to Achieve Goals: Good    Frequency Min 5X/week   Barriers to discharge        End of Session Equipment Utilized During Treatment: Oxygen Activity Tolerance: Patient tolerated treatment well Patient left: in chair;with call bell/phone within reach;with family/visitor present         Time: [id number] PT Time Calculation (min): 64 min   Charges:   PT Evaluation $Initial PT Evaluation Tier I: 1 Procedure PT Treatments $Therapeutic Exercise: 8-22 mins $Therapeutic Activity: 23-37 mins $Self Care/Home Management: 8-22   PT G Codes:          Sakshi Sermons, Eliseo GumKenneth V 05/09/2013, 12:13 PM 05/17/2013  [place]ague BingKen Terrilynn Postell, PT [phone]  (pager)

## 2013-05-23 NOTE — Progress Notes (Signed)
ANTICOAGULATION CONSULT NOTE - follow up Pharmacy Consult for Warfarin Indication: AFib  No Known Allergies  Patient Measurements: Height: 5\' 4"  (162.6 cm) Weight: 156 lb 12 oz (71.1 kg) IBW/kg (Calculated) : 54.7 Heparin Dosing Weight: 70 kg  Vital Signs: Temp: 98.1 F (36.7 C) (03/30 0455) Temp src: Oral (03/30 0455) BP: 77/39 mmHg (03/30 1300) Pulse Rate: 82 (03/30 0455)  Labs:  Recent Labs  05/21/13 0400 05/22/13 0320 05/12/2013 0405  HGB 9.9*  --   --   HCT 29.8*  --   --   PLT 304  --   --   LABPROT 17.3* 19.6* 24.5*  INR 1.45 1.71* 2.29*  CREATININE 2.20* 2.11* 2.06*    Estimated Creatinine Clearance: 18.6 ml/min (by C-G formula based on Cr of 2.06).   Assessment: 89 yof on chronic coumadin for afib. Originally was on IV heparin for possible OHS, now plan is for conservative management of 3V CAD, coumadin resumed on 3/27 after d/c IV heparin. CHADS-VASc score of 5 (CHF, HTN, Age, Female). INR has trended up today from 1.71>>2.29 and is therapeutic. No new CBC, No bleeding noted per chart. Poor po intake with worsening N/V.  PTA dose 2mg  daily  Goal of Therapy:  INR 2-3  Monitor platelets by anticoagulation protocol: Yes  Plan Repeat warfarin 2 mg po x1 Stop heparin [place] injections Daily INR   Vinnie Level, PharmD.  Clinical Pharmacist Pager [phone]

## 2013-05-23 NOTE — Progress Notes (Signed)
Pt called and wanted to die.  She states she is ready and is tired of being here.  I called Theodore Demark to make aware of pt wishes.  Pt continues to have low bp with IVF.  She no longer has nausea.  Says she wants something for pain to ease her suffering.  Dr. Rennis Golden called and pt requested I get her son. Pt resting with call bell within reach.  Will continue to monitor.  Thomas Hoff, RN

## 2013-05-23 NOTE — Progress Notes (Signed)
Chaplain offered ministry of presence to patient's family. Patient expired. Family at bedside. Chaplain offered emotional and grief support.   06/07/13 2300  Clinical Encounter Type  Visited With Family  Visit Type Initial;Spiritual support;Death  Referral From Nurse

## 2013-05-23 NOTE — Progress Notes (Signed)
CSW awaiting PT evaluation and will then send information to [facility]. CSW continuing to follow.  Maree Krabbe, MSW, Theresia Majors [phone]

## 2013-05-23 NOTE — Progress Notes (Signed)
Rn notified at 2200 that pt's QRS widening. RN assessed pt at bedside, patient agonally breathing, ekg assessed sb 26. Pt's family request comfort measures for agonal breathing. MD notified, per Dr. Tresa Endo new orders for morphine gtt. Rn pulled 4 mg morphine prn from pyxis, patient deceased prior to administration of drug. Rn reassessed pt, no pulse noted, breathing ceased. Charge RN Byrd Hesselbach verified patient deceased. MD notified.   Madeline Manns Beatrix Breece,MSN,RN [phone]

## 2013-05-24 ENCOUNTER — Encounter: Payer: Self-pay | Admitting: Physician Assistant

## 2013-05-24 LAB — CULTURE, BLOOD (ROUTINE X 2)
CULTURE: NO GROWTH
Culture: NO GROWTH

## 2013-05-25 DEATH — deceased

## 2013-06-05 NOTE — Discharge Summary (Signed)
Death Summary  Patient ID: Madeline Perez MRN: [id number] DOB/AGE: 1923/08/02 80 y.o.  Admit date: 13-Jun-2013 Date of Death: 2013/06/19  Admission/Discharge Diagnoses:  Principal Problem:   NSTEMI (non-ST elevated myocardial infarction) Active Problems:   Coronary atherosclerosis of native coronary artery   Aortic valve stenosis   Chronic diastolic CHF (congestive heart failure)   Atrial fibrillation   Chronic kidney disease   Intertrochanteric fracture of right hip   Compression fracture of lumbar spine, non-traumatic   Hospital Course:   [name] is an 81 yo woman with known severe AS, HfpEF, atrial fibrillation, chronic anticoagulation with warfarin, stage III CKD, hypertension, dyslipidemia who was initially admitted to [facility] 05/11/13 after a mechanical fall that resulted in a right hip fracture. Cardiology was consulted for pre-operatively evaluation. She had had a plan to see Drs. Cooper/Owen for consideration of TAVR later this year. Orthopaedics saw [name] and recommended ORIF; warfarin was held and she underwent right ORIF without difficulty until POD 2 when she developed some substernal CP with mild elevation of troponins (peak 0.36 per records) with [facility] 06/13/2013 revealing 3v CAD and she was transferred to [facility] for further cardiothoracic surgery consultation for potential surgery versus medical therapy. TCTS was consulted and spoke at length with [name] and her family and her surgery risk was quite high and given her decompensated state TCTS thought medical therapy and or cardiac catheterization was her current best option for her CAD and surgical AVR/TAVR could be considered in a more compensated state. [name] and her family ultimately preferred medical therapy and after family discussions moved towards palliative care and was officially a DNR. On the evening of June 19, 2013, she had a respiratory arrest and the physician/Dr. Tresa Endo was notified. I  looked for respirations, listened for heart sounds and felt for a pulse for 2 minutes without evidence of heartbeat/respiration/pulse. Time of death 10:32 pm 19-Jun-2013. The family was at the bedside, chaplain was notified and all questions answered. The primary physician was called in the morning to sign the death certificate.   Underlying cause of death: multifactorial: underlying three vessel coronary artery disease and recent hip fracture   Immediate cause of death: cardiopulmonary arrest   Disposition: 20-Expired  Signed: Leeann Must 06/05/2013, 6:11 PM

## 2014-05-19 IMAGING — NM NM LUNG SCAN
2 series · 16 of 16 positions shown · non-contrast
Comparison: none

REASON FOR EXAM: pleuritic chest pain
COMMENTS:

[Series 1000: lung perfusion · 1.95mm/px · 4 acquisitions, 8 frames shown]
[im 1/4]
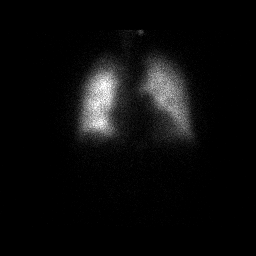
[im 1/4]
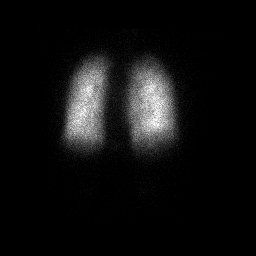
[im 2/4]
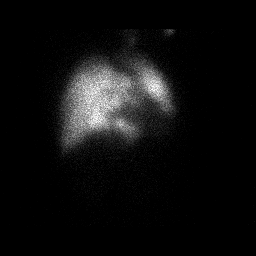
[im 2/4]
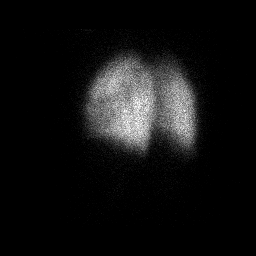
[im 3/4]
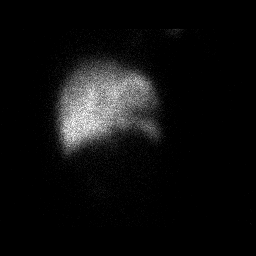
[im 3/4]
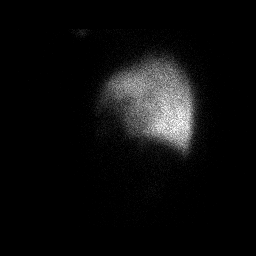
[im 4/4]
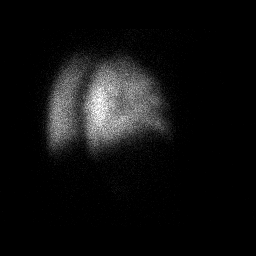
[im 4/4]
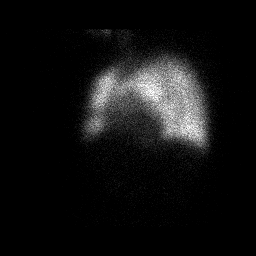

[Series 1000: lung ventilation · 3.90mm/px · 4 acquisitions, 8 frames shown]
[im 1/4]
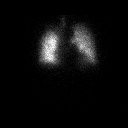
[im 1/4]
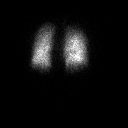
[im 2/4]
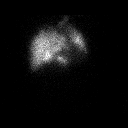
[im 2/4]
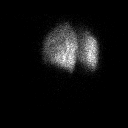
[im 3/4]
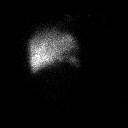
[im 3/4]
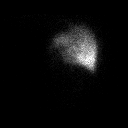
[im 4/4]
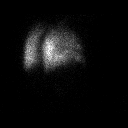
[im 4/4]
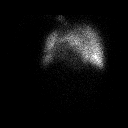

[16 of 16 positions shown; findings below may reference images not displayed]

PROCEDURE:     NM  - NM VQ LUNG SCAN  - [DATE] [DATE] [DATE] [DATE]

RESULT:     [DATE] mCi of technetium 99 M DTPA were placed in the nebulizer
for ventilation portion of the exam. The patient received an injection of
4.025 mCi of technetium 99 M MAA for the perfusion images. Correlation is
made with the chest radiograph dated 17 July, 2011.

Radiographically the cardiac silhouette is enlarged. There is
atherosclerotic calcification of the aorta without definite focal
infiltrate, significant effusion or pneumothorax. Compression fractures are
noted in the lower thoracic region.

Ventilation images show some mild heterogeneity subsegmental decreased
localization in the right middle lobe region. Otherwise ventilation is
grossly normal.

Perfusion images also show minimal decrease localization in the right middle
lobe as seen on RPO projection. No other significant perfusion defect is
IMPRESSION: Matched tiny area of decreased ventilation and perfusion in
the right middle lobe low probability of pulmonary embolism.

[REDACTED]

## 2014-06-13 NOTE — Consult Note (Signed)
General Aspect PCP: Dr. Derrel Nip.  Cardiologist: Dr. Rockey Situ.   [name] is a 81 year old woman with history of paroxysmal atrial fibrillation, moderate aortic valve stenosis, chronic lower extremity edema, chronic renal insufficiency, chronic diastolic heart failure and pulmonary hypertension .  she presented with progressive worsening of dyspnea, orthopnea and lower extremity edema. She is supposed to be taking Lasix regularly but does not take it in a consistent basis. She denies any chest pain or palpitations. The patient was noted to be significantly hypoxic with pulmonary edema on chest x-ray. BNP was elevated. Subsequent troponin was slightly elevated.   Physical Exam:   GEN critically ill appearing    NECK supple    RESP postive use of accessory muscles  crackles  1/3 of both lungs.    CARD Regular rate and rhythm  Murmur    Murmur Systolic    Systolic Murmur Out flow    ABD denies tenderness    EXTR positive edema    PSYCH alert, A+O to time, place, person   Review of Systems:   Subjective/Chief Complaint dyspnea and lower extremity edema    General: Fatigue    Skin: No Complaints    ENT: No Complaints    Eyes: No Complaints    Neck: No Complaints    Respiratory: Short of breath    Cardiovascular: Dyspnea  Orthopnea  Edema    Gastrointestinal: No Complaints    Genitourinary: No Complaints    Vascular: No Complaints    Neurologic: No Complaints    Hematologic: No Complaints    Endocrine: No Complaints    Psychiatric: No Complaints    Review of Systems: All other systems were reviewed and found to be negative   Home Medications: Medication Instructions Status  Metoprolol 50 mg one day.  Active  Warfarin 2 mg or as directed.   once a day DO NOT TAKE UNTIL LABS ARE DRAWN AS OUTPATIENT AND INSTRUCTED TO DO SO BY PHYSICIAN Active  amlodipine 5 mg tablet 1 tab(s) orally once a day  Active  Vitamin D3 1000 intl units oral tablet 1 cap(s) orally  once a day  Active  propafenone 225 mg oral tablet 1 tab(s) orally 3 times a day  Active  Ocuvite PreserVision  orally once a day Active  Crestor 10 mg oral tablet 1 tab(s) orally once a day (at bedtime) Active  Lasix 40 mg oral tablet 1 tab(s) orally once a day Active   Lab Results: Cardiology:  14-Oct-13 07:55    Echo Doppler  Interpretation Summary   Left ventricular systolic function is normal. Ejection Fraction =  >55%. The transmitral spectral Doppler flow pattern is suggestive of  pseudonormalization. There is mild concentric left ventricular  hypertrophy. The left atrium is mildly dilated. There is mild mitral  stenosis. There is mild mitral regurgitation. There is moderate  tricuspid regurgitation. Right ventricular systolic pressure is  elevated at 50-76mHg. Moderate valvular aortic stenosis with a mean  gradient of 31 mm Hg and valve area of 1.22.  Mild aortic  regurgitation. Moderate size left pleural effusion.  Procedure:   A two-dimensional transthoracic echocardiogram with color flow and  Doppler was performed.  Left Ventricle   No regional wall motion abnormalities noted.   Ejection Fraction = >55%.   The transmitral spectral Doppler flow pattern is abnormal for age.   The transmitral spectral Doppler flow pattern is suggestive of  pseudonormalization.   The left ventricle is normal in size.   There is  mild concentric left ventricular hypertrophy.   Left ventricular systolic function is normal.  Right Ventricle   The right ventricle is normal size.   There is normal right ventricular wall thickness.   The right ventricular systolic function is normal.  Atria   The left atrium is mildly dilated.   Right atrial size is normal.   The interatrial septum is intact with no evidence for an atrial  septal defect.  Mitral Valve   Calcified mitral apparatus causing mitral stenosis.   There is moderate mitral annular calcification.   There is mild mitral  regurgitation.   There is mild mitral stenosis.  Tricuspid Valve   The tricuspid valve is not well visualized, but is grossly normal.   No significant tricuspid stenosis.   There is moderate tricuspid regurgitation.   Right ventricular systolic pressure is elevated at 50-73mHg.  Aortic Valve   Mild aortic regurgitation.   Moderate valvular aortic stenosis.   Moderately calcified aortic valve.  Pulmonic Valve   The pulmonic valve is not well visualized.   Trace pulmonic valvular regurgitation.  Great Vessels   The aortic root is normal size.  Pericardium/Pleural   Moderate size left pleural effusion.   No pericardial effusion.  MMode 2D Measurements and Calculations   RVDd: 2.8 cm   IVSd: 1.3 cm   LVIDd: 5.1 cm   LVIDs: 3.5 cm   LVPWd: 1.4 cm   FS: 32 %   EF(Teich): 60 %   Ao root diam: 2.6 cm   LA dimension: 4.2 cm   LVOT diam: 2.1 cm  Doppler Measurementsand Calculations   MV E point: 167 cm/sec   MV A point: 164 cm/sec   MV E/A: 1.0    MV V2 max: 214 cm/sec   MV max PG: 18 mmHg   MV V2 mean: 125 cm/sec   MV mean PG: 7.0 mmHg   MV V2 VTI: 60 cm   MV P1/2t max vel: 175 cm/sec   MV P1/2t: 56 msec   MVA(P1/2t): 3.9 cm2   MV dec slope: 916 cm/sec2   MV dec time: 0.16 sec   Ao V2 max: 339 cm/sec   Ao max PG: 46 mmHg   Ao V2 mean: 246 cm/sec   Ao mean PG: 29 mmHg   Ao V2 VTI: 85 cm   AVA(I,D): 1.4 cm2   AVA(V,D): 1.2 cm2   LV max PG: 6.0 mmHg   LV mean PG: 3.0 mmHg   LV V1 max: 120 cm/sec   LV V1 mean: 84 cm/sec   LV V1 VTI: 35 cm   SV(LVOT): 123 ml   PA V2 max: 122 cm/sec   PA max PG: 6.0 mmHg   TR Max vel: 337 cm/sec   TR Max PG: 45 mmHg   RVSP: 50 mmHg   RAP systole: 5.0 mmHg  Reading Physician: AKathlyn Sacramento Sonographer: HSherrie SportInterpreting Physician:  MKathlyn Sacramento  electronically signed on  12-08-2011 09:31:37 Requesting Physician: AKathlyn Sacramento Routine Chem:  14-Oct-13 01:05    Result Comment troponin - RESULTS VERIFIED BY  REPEAT TESTING.  - prev c/ @ 1830 12/07/11 mpg  Result(s) reported on 08 Dec 2011 at 01:48AM.   Glucose, Serum  126   BUN  37   Creatinine (comp)  1.47   Sodium, Serum 140   Potassium, Serum 3.5   Chloride, Serum 103   CO2, Serum 27   Calcium (Total), Serum  8.1   Anion Gap 10   Osmolality (  calc) 290   eGFR (African American)  37   eGFR (Non-African American)  32 (eGFR values <25m/min/1.73 m2 may be an indication of chronic kidney disease (CKD). Calculated eGFR is useful in patients with stable renal function. The eGFR calculation will not be reliable in acutely ill patients when serum creatinine is changing rapidly. It is not useful in  patients on dialysis. The eGFR calculation may not be applicable to patients at the low and high extremes of body sizes, pregnant women, and vegetarians.)  Cardiac:  14-Oct-13 01:05    CK, Total 124   CPK-MB, Serum 3.2 (Result(s) reported on 08 Dec 2011 at 01:46AM.)   Troponin I  0.54 (0.00-0.05 0.05 ng/mL or less: NEGATIVE  Repeat testing in 3-6 hrs  if clinically indicated. >0.05 ng/mL: POTENTIAL  MYOCARDIAL INJURY. Repeat  testing in 3-6 hrs if  clinically indicated. NOTE: An increase or decrease  of 30% or more on serial  testing suggests a  clinically important change)  Routine Coag:  14-Oct-13 01:05    Prothrombin  33.1   INR 3.2 (INR reference interval applies to patients on anticoagulant therapy. A single INR therapeutic range for coumarins is not optimal for all indications; however, the suggested range for most indications is 2.0 - 3.0. Exceptions to the INR Reference Range may include: Prosthetic heart valves, acute myocardial infarction, prevention of myocardial infarction, and combinations of aspirin and anticoagulant. The need for a higher or lower target INR must be assessed individually. Reference: The Pharmacology and Management of the Vitamin K  antagonists: the seventh ACCP Conference on Antithrombotic  and Thrombolytic Therapy. CVCBSW.9675Sept:126 (3suppl): [id number] A HCT value >55% may artifactually increase the PT.  In one study,  the increase was an average of 25%. Reference:  "Effect on Routine and Special Coagulation Testing Values of Citrate Anticoagulant Adjustment in Patients with High HCT Values." American Journal of Clinical Pathology 2006;126:400-405.)  Routine Hem:  14-Oct-13 01:05    WBC (CBC)  16.3   RBC (CBC)  3.58   Hemoglobin (CBC)  11.2   Hematocrit (CBC)  33.4   Platelet Count (CBC) 173   MCV 93   MCH 31.3   MCHC 33.5   RDW  14.9   Neutrophil % 90.7   Lymphocyte % 2.6   Monocyte % 5.7   Eosinophil % 0.1   Basophil % 0.9   Neutrophil #  14.8   Lymphocyte #  0.4   Monocyte # 0.9   Eosinophil # 0.0   Basophil # 0.1 (Result(s) reported on 08 Dec 2011 at 01:17AM.)   EKG:   EKG NSR   Radiology Results: XRay:    14-Oct-13 08:26, Chest PA and Lateral   Chest PA and Lateral    REASON FOR EXAM:    respiaratory failure  COMMENTS:       PROCEDURE: DXR - DXR CHEST PA (OR AP) AND LATERAL  - Dec 08 2011  8:26AM     RESULT: Comparison is made to the most recent study of 07 December 2010.    There is patchy increased density in both lungs is an underlying pattern   of possible COPD. There small bilateral effusions. Asymmetric edema   versus bilateral pneumonia are the most likely differential   considerations. There is significant worsening since May 2013 and   worsening since the study of December 07, 2011.    IMPRESSION:  Worsening appearance of the chest findings most suggestive   of bilateral pneumonia or asymmetric edema. Correlate  with clinical and     laboratory data.    Dictation Site: 2          Verified By: Sundra Aland, M.D., MD  Cardiology:    14-Oct-13 07:55, Echo Doppler   Echo Doppler    Interpretation Summary    Left ventricular systolic function is normal. Ejection Fraction =   >55%. The transmitral spectral Doppler flow  pattern is suggestive of   pseudonormalization. There is mild concentric left ventricular   hypertrophy. The left atrium is mildly dilated. There is mild mitral   stenosis. There is mild mitral regurgitation. There is moderate   tricuspid regurgitation. Right ventricular systolic pressure is   elevated at 50-73mHg. Moderate valvular aortic stenosis with a mean   gradient of 31 mm Hg and valve area of 1.22.  Mild aortic   regurgitation. Moderate size left pleural effusion.    Procedure:    A two-dimensional transthoracic echocardiogram with color flow and   Doppler was performed.    Left Ventricle    No regional wall motion abnormalities noted.    Ejection Fraction = >55%.    The transmitral spectral Doppler flow pattern is abnormal for age.    The transmitral spectral Doppler flow pattern is suggestive of   pseudonormalization.    The left ventricle is normal in size.    There is mild concentric left ventricular hypertrophy.    Left ventricular systolic function is normal.    Right Ventricle    The right ventricle is normal size.    There is normal right ventricular wall thickness.    The right ventricular systolic function is normal.    Atria    The left atrium is mildly dilated.    Right atrial size is normal.    The interatrial septum is intact with no evidence for an atrial   septal defect.    Mitral Valve    Calcified mitral apparatus causing mitral stenosis.    There is moderate mitral annular calcification.    There is mild mitral regurgitation.    There is mild mitral stenosis.    Tricuspid Valve    The tricuspid valve is not well visualized, but is grossly normal.    No significant tricuspid stenosis.    There is moderate tricuspid regurgitation.    Right ventricular systolic pressure is elevated at 50-635mg.    Aortic Valve    Mild aortic regurgitation.    Moderate valvular aortic stenosis.    Moderately calcified aortic valve.    Pulmonic Valve    The  pulmonic valve is not well visualized.    Trace pulmonic valvular regurgitation.    Great Vessels    The aortic root is normal size.    Pericardium/Pleural    Moderate size left pleural effusion.    No pericardial effusion.    MMode 2D Measurements and Calculations    RVDd: 2.8 cm    IVSd: 1.3 cm    LVIDd: 5.1 cm    LVIDs: 3.5 cm    LVPWd: 1.4 cm    FS: 32 %    EF(Teich): 60 %    Ao root diam: 2.6 cm    LA dimension: 4.2 cm    LVOT diam: 2.1 cm    Doppler Measurementsand Calculations    MV E point: 167 cm/sec    MV A point: 164 cm/sec    MV E/A: 1.0     MV V2 max: 214 cm/sec    MV max  PG: 18 mmHg    MV V2 mean: 125 cm/sec    MV mean PG: 7.0 mmHg    MV V2 VTI: 60 cm    MV P1/2t max vel: 175 cm/sec    MV P1/2t: 56 msec    MVA(P1/2t): 3.9 cm2    MV dec slope: 916 cm/sec2    MV dec time: 0.16 sec    Ao V2 max: 339 cm/sec    Ao max PG: 46 mmHg    Ao V2 mean: 246 cm/sec    Ao mean PG: 29 mmHg    Ao V2 VTI: 85 cm    AVA(I,D): 1.4 cm2    AVA(V,D): 1.2 cm2    LV max PG: 6.0 mmHg    LV mean PG: 3.0 mmHg    LV V1 max: 120 cm/sec    LV V1 mean: 84 cm/sec    LV V1 VTI: 35 cm    SV(LVOT): 123 ml    PA V2 max: 122 cm/sec    PA max PG: 6.0 mmHg    TR Max vel: 337 cm/sec    TR Max PG: 45 mmHg    RVSP: 50 mmHg    RAP systole: 5.0 mmHg    Reading Physician: Kathlyn Sacramento   Sonographer: Sherrie Sport  Interpreting Physician:  Kathlyn Sacramento,  electronically signed on   12-08-2011 09:31:37  Requesting Physician: Kathlyn Sacramento    No Known Allergies:   Vital Signs/Nurse's Notes: **Vital Signs.:   14-Oct-13 10:55   Vital Signs Type Routine   Temperature Temperature (F) 98.4   Celsius 36.8   Temperature Source Oral   Pulse Pulse 71   Pulse source if not from Vital Sign Device per Telemetry Clerk   Respirations Respirations 20   Systolic BP Systolic BP 161   Diastolic BP (mmHg) Diastolic BP (mmHg) 61   Mean BP 88   Pulse Ox % Pulse Ox % 97   Pulse Ox Activity  Level  At rest   Oxygen Delivery High Flow Nasal Cannula     Impression 1. Acute on chronic diastolic heart failure 2. Moderate aortic stenosis. 3. Paroxysmal atrial fibrillation on long-term anticoagulation. 4. Mildly elevated cardiac enzymes likely supply demand ischemia.    Plan the patient appears to be still significantly fluid overloaded. she is still hypoxic. I agree with Lasix 40 mg twice daily. Continue other cardiac medications. she underwent an echocardiogram which showed normal LV systolic function, grade 2 diastolic dysfunction, moderate aortic stenosis and moderate pulmonary hypertension. I suspect that the elevated cardiac enzymes are likely due to supply demand ischemia. Given her age, comorbidities and lack of angina, I would not pursue ischemic cardiac evaluation at this time.   Electronic Signatures: Kathlyn Sacramento (MD)  (Signed 14-Oct-13 13:11)  Authored: General Aspect/Present Illness, History and Physical Exam, Review of System, Home Medications, Labs, EKG , Radiology, Allergies, Vital Signs/Nurse's Notes, Impression/Plan   Last Updated: 14-Oct-13 13:11 by Kathlyn Sacramento (MD)

## 2014-06-13 NOTE — H&P (Signed)
PATIENT NAME:  Madeline Perez, Madeline Perez MR#:  [id number] DATE OF BIRTH:  July 28, 1923  DATE OF ADMISSION:  12/07/2011  PRIMARY CARE PHYSICIAN: Duncan Dull, MD  ED REFERRING PHYSICIAN: Julien Nordmann, MD  CHIEF COMPLAINT: Nausea, vomiting, diarrhea, and acute respiratory failure.   HISTORY OF PRESENT ILLNESS: The patient is an 81 year old white female with history of hyperlipidemia, hypertension, history of atrial fibrillation, questionable history of congestive heart failure, chronic renal failure, and history of aortic stenosis who reports that she started feeling very weak since yesterday. She was very tired and did not have an appetite. She had some intermittent wheezing. Then starting this morning she started developing diarrhea and nausea and had dry heaves. She had a few episodes of the diarrhea and also started becoming very short of breath. When she arrived in the ED, she was saturating 70% and was tachypneic. She had to be placed on a full-face mask. The patient reports that she does have a history of shortness of breath, but it has been very mild. There is also questionable history of congestive heart failure. She reports that she is unable to lie flat at nighttime and has to use three pillows. She also has noticed some swelling in her legs. She has not had any fevers or chills, has not had any chest pains or palpitations, denies any abdominal pain, no hematemesis or hematochezia, and denies any urinary symptoms.   PAST MEDICAL HISTORY:  1. Hyperlipidemia.  2. Hypertension.  3. History of atrial fibrillation.  4. History of congestive heart failure.  5. Chronic renal failure, creatinine is usually around 1.3 to 1.4.   PAST SURGICAL HISTORY: Status post cholecystectomy.   ALLERGIES: No known drug allergies.   CURRENT MEDICATIONS:  1. Amlodipine 5 mg daily.  2. Crestor 10 mg at bedtime.  3. Lasix 40 mg daily.  4. Metoprolol 50 mg one tab p.o. daily.  5. Ocuvite PreserVision 1 tab p.o.  daily. 6. Propafenone 225 mg 1 tab p.o. three times daily. 7. Vitamin D3 1000 international units daily.  8. Warfarin 2 mg daily.   SOCIAL HISTORY: She does not smoke, does not drink, no drugs. There is family history of coronary artery disease.   REVIEW OF SYSTEMS: CONSTITUTIONAL: Complains of generalized weakness and fatigue. No weight gain. No weight loss. HEENT: Denies any visual blurriness. No double vision. No discharge from her eyes. Denies any epistaxis. No nasal drainage. Denies any difficulty with swallowing. NECK: Denies any thyromegaly or neck pain. CARDIOVASCULAR: Has chronic atrial fibrillation, on chronic anticoagulation. Denies any syncope or palpitations. GI: Complains nausea and dry heaves and diarrhea. No hematemesis. No hematochezia. No abdominal pain. GENITOURINARY: Denies any frequency, urgency, or hesitancy. ENDOCRINE: Denies any polyuria or nocturia. No problems with her thyroid. SKIN: Denies any rash. LYMPHATICS: Denies any lymph node enlargements. NEUROLOGIC: Denies any CVA or transient ischemic attack or seizures. PSYCHIATRIC: Denies any anxiety or depression. HEME: Denies history of anemia, easy bruisability, or bleeding.   PHYSICAL EXAMINATION:   VITAL SIGNS: Temperature 99.7, pulse 69, respirations 22, blood pressure 106/49, and O2 99% on full-face mask.   GENERAL: The patient is an elderly 81 year old white female critically-appearing in mild respiratory distress.   HEENT: Head atraumatic, normocephalic. Pupils are equally round and reactive to light and accommodation. Extraocular movements intact. There is no conjunctival pallor. No scleral icterus. Nasal exam shows no drainage or ulceration. Oropharynx is clear without any exudates.   NECK: No thyromegaly. No carotid bruits. The patient is propped up, hard to  appreciate any JVD.   CARDIOVASCULAR: Regular rate and rhythm. There is a systolic murmur at the left sternal border. No gallops appreciated.   PULMONARY:  The patient is using accessory muscles. Has occasional wheezing, also some crackles at the bases.   ABDOMEN: Soft, nontender, and nondistended. Positive bowel sounds x4.   EXTREMITIES: She has 1 to 2+ edema.   SKIN: No rash.   LYMPHATICS: No lymph nodes palpable.   VASCULAR: Good DP and PT pulses.   NEUROLOGICAL: The patient is currently awake and alert, oriented x3.   PSYCHIATRIC: Not anxious or depressed.   EVALUATIONS: Blood glucose 135. BNP 8492. BUN 34, creatinine 1.56, sodium 139, potassium 4.4, chloride 106, and CO2 23. CPK 68. CK-MB 1.0. Troponin 0.03. TSH 1.94. WBC 15.5, hemoglobin 11.8, and platelet count 189. INR is 2.7.   Urinalysis showed 3+ bacteria, leukocytes negative, and nitrites negative.   Chest x-ray showed bilateral diffuse infiltrates.   BNP 8900.  ASSESSMENT AND PLAN: The patient is an 81 year old white female with history of possible congestive heart failure and what sounds like aortic stenosis, atrial fibrillation, hypertension, and hyperlipidemia who has not felt well since yesterday. She developed diarrhea and nausea and became short of breath.   1. Acute respiratory failure likely due to acute congestive heart failure, type unknown. At this time, I will treat her with IV Lasix. She reports she had an echocardiogram about six months ago. We will go ahead and repeat an echocardiogram to evaluate her valves as well as systolic or diastolic dysfunction. We will also ask Dr. Mariah Milling who has seen her in the past to come see the patient. The patient may also have atypical pneumonia with elevated WBC count, so we will treat her with IV ceftriaxone and azithromycin. I will also place her on nebulizers due to wheezing.  2. Acute renal failure, on chronic renal failure. We will follow her renal function.  3. Diarrhea, nausea, and vomiting. Possible gastroenteritis. Symptoms have improved. We will check stool Clostridium difficile.  4. Possible urinary tract infection or  could be just urine contamination from diarrhea. At this time, we will check urine cultures. The patient is already on IV antibiotics for possible atypical pneumonia. That should cover her urinary tract infection.  5. History of hypertension. In light of her acute illness and borderline blood pressure I will hold her antihypertensives.  6. Atrial fibrillation. We will continue her home regimen of Coumadin and propafenone. I will hold metoprolol for the time being in light of low borderline blood pressure.  7. Hyperlipidemia. Continue Crestor as taking at home.   CODE STATUS: I discussed with the patient. She wishes a PARTIAL CODE. She wishes no intubation but does want to be resuscitated with cardiac medications and chest compressions and defibrillation.   CRITICAL CARE TIME SPENT: 50 minutes.  ____________________________ Lacie Scotts Allena Katz, MD shp:slb D: 12/07/2011 13:01:00 ET T: 12/07/2011 13:22:38 ET JOB#: [id number]  cc: Duncan Dull, MD Charise Carwin MD ELECTRONICALLY SIGNED 12/11/2011 12:08

## 2014-06-13 NOTE — Discharge Summary (Signed)
PATIENT NAME:  ZANETA, ORENSTEIN MR#:  [id number] DATE OF BIRTH:  [birth year]  DATE OF ADMISSION:  12/07/2011 DATE OF DISCHARGE:  12/12/2011  For a detailed note, please take a look at the history and physical done on admission by Dr. Auburn Bilberry.   DIAGNOSES AT DISCHARGE:  1. Acute respiratory failure secondary to congestive heart failure.  2. Congestive heart failure, likely acute on chronic diastolic dysfunction.  3. Acute renal failure.  4. Chronic atrial fibrillation.  5. Hyperlipidemia.  6. Urinary tract infection.   DIET: The patient is being discharged on a low sodium diet, low fat diet.   ACTIVITY: As tolerated.   FOLLOW-UP: Follow-up with Dr. Darrick Huntsman and Dr. Julien Nordmann in the next 1 to 2 weeks.   DISCHARGE MEDICATIONS:  1. Warfarin 2 mg daily.  2. Toprol 50 mg daily.  3. Amlodipine 5 mg daily.  4. Vitamin D3 1000 international units daily.  5. Propafenone 225 mg t.i.d.  6. Ocuvite 1 tab daily.  7. Crestor 10 mg daily.  8. Lasix 40 mg daily.  9. Ceftin 250 mg b.i.d. x3 days. 10. Potassium 20 mEq daily.   CONSULTANT DURING THE HOSPITAL COURSE: Dr. Julien Nordmann from Cardiology     PERTINENT STUDIES DONE DURING THE HOSPITAL COURSE:  1. Chest x-ray done on admission showing findings representing pulmonary edema.   2. Two-dimensional echocardiogram done showing left ventricular systolic function to be normal, EF 55%, mild concentric left ventricular hypertrophy, left atrium mildly dilated, mild mitral stenosis, mild mitral regurgitation, moderate tricuspid regurgitation, right ventricular systolic pressure to be elevated at 50 to 60 mmHg, moderate valvular aortic stenosis. Valve area of 1.22. Mild aortic regurgitation. Moderate size left pleural effusion.   HOSPITAL COURSE: This is an 81 year old female with medical problems as mentioned above who presented to the hospital on 12/07/2011 secondary to shortness of breath, nausea, vomiting, and diarrhea and noted to be in  acute respiratory failure.  1. Acute respiratory failure. This was likely secondary to acute congestive heart failure. The patient presented with chest x-ray findings suggestive of pulmonary edema as she also had been noncompliant with taking her Lasix. The patient was started on aggressive IV diuresis with Lasix. She responded well to diuresis. She has been about 8 liters negative since admission. She was weaned off from high flow nasal cannula down to just 2 to 3 liters nasal cannula. She was ambulated on room air and did desaturate to the mid 80's, therefore, she is being arranged for home oxygen prior to discharge. Although her clinical symptoms from heart failure have significantly improved, she is being discharged on some p.o. beta-blockers and Lasix as mentioned with close follow-up with Cardiology as an outpatient.  2. Acute on chronic heart failure. This is likely secondary to diastolic dysfunction. Again, the patient was aggressively diuresed with IV Lasix and responded well to it. Her oxygen requirements and clinical symptoms have improved. She was seen in consultation by Dr. Mariah Milling from Cardiology. Her echocardiogram showed normal LV function with EF 55% with LVH and mild to moderate aortic stenosis. She will be discharged on her maintenance meds including Lasix, beta-blockers, and close follow-up with Cardiology as an outpatient. She was not given an ACE inhibitor given some acute renal failure.  3. Acute renal failure. This was likely secondary to some overdiuresis. Although creatinine has remained stable, she probably has some underlying CKD. Her renal function further can be followed as an outpatient.  4. Chronic atrial fibrillation. The patient remained rate  controlled on her propafenone and Toprol which she will continue. She was on Coumadin. INR remained therapeutic which she will resume upon discharge.  5. Hyperlipidemia. The patient was maintained on her Crestor. She will resume that.   6. Urinary tract infection. The patient was treated with IV ceftriaxone here in the hospital. She is being discharged on some p.o. Ceftin for the next few days to treat her urinary tract infection. Her urine culture grew out Escherichia coli which was pansensitive.  7. Hypokalemia. This was secondary to the diuresis from Lasix. Her potassium was supplemented which improved and she currently is being discharged on potassium supplements.   CODE STATUS: The patient is a LIMITED CODE.   TIME SPENT WITH THE DISCHARGE: 40 minutes.   ____________________________ Rolly Pancake. Cherlynn Kaiser, MD vjs:drc D: 12/12/2011 15:40:11 ET T: 12/14/2011 14:28:03 ET JOB#: [id number]  cc: Rolly Pancake. Cherlynn Kaiser, MD, <Dictator> Duncan Dull, MD Antonieta Iba, MD Houston Siren MD ELECTRONICALLY SIGNED 12/14/2011 21:57

## 2014-06-16 NOTE — H&P (Signed)
PATIENT NAME:  Madeline Perez, Madeline Perez MR#:  [id number] DATE OF BIRTH:  1924/01/21  DATE OF ADMISSION:  07/17/2012  PRIMARY CARE PHYSICIAN:  Dr. Derrel Nip.  CARDIOLOGIST:  Dr. Rockey Situ.   CHIEF COMPLAINT:  Shortness of breath.   HISTORY OF PRESENT ILLNESS:  The patient is a pleasant 81 year old Caucasian female with history of diastolic CHF, chronic kidney disease and a-fib. Apparently the patient has been living by herself for the past month as her husband has been recently transferred to long-term care. The patient is in resorting to TV dinners and canned foods. She was also seen by a PA at her PCP's office and was told that she was dehydrated so perhaps she is drinking a little more fluid also. She is gained about 5 pounds in the last week with increased dyspnea on exertion and shortness of breath. She had dry cough this morning and has had wheezing as well. She presented to the hospital where she was found to have severe hypoxemia with O2 sat of about 68% on room air, and she was also placed on oxygen with sats currently in the 90 range. The patient feels short of breath and is having audible wheezing. She was given a dose of Lasix as well. Hospitalist services were contacted for further evaluation and management.   PAST MEDICAL HISTORY:  Hypertension, hyperlipidemia, chronic a-fib on Coumadin, history of diastolic CHF, sees Dr. Rockey Situ for that. CKD, baseline creatinine of about 1.3 to 1.4. Denies history of COPD.   PAST SURGICAL HISTORY:  Cholecystectomy.   ALLERGIES:  No known drug allergies.   FAMILY HISTORY:  Several family members with CAD and early MRI.   SOCIAL HISTORY:  Currently lives by herself as her husband has gone to long-term care. No smoking, alcohol or drug use.   OUTPATIENT MEDICATIONS:  Lasix 40 mg twice a day, allopurinol 100 mg daily, amlodipine 5 mg daily, Crestor 10 mg daily, Klor-Con 20 mEq extended-release 1 tab once a day, metoprolol succinate 50 mg once a day, Ocuvite 1 tab  daily, pramipexole 0.25 mg at bedtime, propafenone 225 mg 3 times a day, trazodone 25 mg at bedtime as needed for sleep, warfarin 2 mg daily.  REVIEW OF SYSTEMS:  CONSTITUTIONAL:  No fever. Positive fatigue, weakness and weight gain.  EYES:  No blurry vision or double vision.  ENT:  No tinnitus, hearing loss or snoring.  RESPIRATORY:  Positive for cough, wheezing, shortness of breath, dyspnea on exertion. The patient also complains of rib pain on the left side as she was stretching to obtain something on the shelf about several weeks ago and heard a pop.  CARDIOVASCULAR:  No chest pain. Positive orthopnea, 3-pillows, as well as increased lower extremity edema. Has a history of a-fib and diastolic CHF.  GASTROINTESTINAL:  No nausea, vomiting, diarrhea, abdominal pain, hematemesis, melena or ulcers.  GENITOURINARY:  Denies dysuria, hematuria. HEMATOLOGIC AND LYMPHATIC:  No anemia or easy bruising.  SKIN:  No rashes.  MUSCULOSKELETAL:  Denies arthritis.  NEUROLOGIC:  No numbness or weakness, stroke or TIA> PSYCHIATRIC:  No anxiety or depression.   PHYSICAL EXAMINATION:  VITAL SIGNS:  Temperature on arrival 98.5, pulse rate 76, respiratory rate 28, blood pressure 141/70, O2 sat on arrival 98%, last O2 sat 92% on 4 L of oxygen.  GENERAL:  The patient is an elderly Caucasian female lying in bed in no obvious distress.  HEENT:  Normocephalic, atraumatic. Pupils are equal and reactive. Anicteric sclerae. Extraocular muscles intact. Moist mucous membranes.  NECK:  Supple. No thyroid tenderness. Mild JVD.  CARDIOVASCULAR:  Irregularly irregular. No murmurs, rubs, or gallops.  LUNGS:  Decreased breath sounds at the bases but coarse bilaterally with wheezing, both lungs, with scattered crackles.  ABDOMEN:  Soft, nontender, nondistended. Positive bowel sounds in all quadrants.  EXTREMITIES:  2+ lower extremity edema to mid shins.  NEUROLOGIC:  Cranial nerves II through XII grossly intact. Strength is 5/5  in all extremities. Sensation is intact to light touch.  PSYCHIATRIC:  Awake, alert and oriented x 3. Pleasant and cooperative.   LABORATORY, DIAGNOSTIC AND RADIOLOGICAL DATA:  BNP is 5824, glucose 120, BUN 23, creatinine 1.24, sodium 137, potassium 4. LFTs:  Albumin is 3.3, bilirubin 1.1, alk phos 142. Troponin negative. CK-MB less than 0.5, CK total 56. WBC 15.5, hemoglobin 10.7, and platelets 219. INR not done. EKG shows normal sinus rhythm of a rate 72, some LVH, elevated QRS duration, no acute ST elevations or depressions on the EKG. X-ray of the chest, PA and lateral, showing findings of pulmonary edema with small bilateral pleural effusion, small nodular density overlying the right upper lung, likely artifactual.   ASSESSMENT AND PLAN:  We have a pleasant 81 year old female with a history of diastolic congestive heart failure, chronic atrial fibrillation on Coumadin, chronic kidney disease, stage III, who presents with acute onset of shortness of breath and dyspnea on exertion in the setting of a weight gain of about 5 pounds in the last few days, with nonproductive dry cough with severe hypoxemia with oxygen saturation of 60% on arrival, likely secondary to acute on chronic diastolic congestive heart failure. Would admit the patient to the hospital. Although she has elevated WBC count, I doubt she is having a pneumonia. She was not significantly symptomatic prior to yesterday. Her symptoms including resorting to TV dinners and some increased fluid intake in the recent pass also just suggests congestive heart failure. The patient has no fever as well to make me concerned for pneumonia at this point. I would track the white blood cell count at this point, but diuresis the patient, monitor ins and outs and start the patient on IV Lasix, increase oxygen to keep sats of greater than 95% and consider BiPAP if needed as it could help with acute flash edema causing her congestive heart failure. I would monitor  her kidney function. She does have stage III chronic kidney disease, which is stable. I would hold the amlodipine and decrease the beta blocker at this point to allow for diuresis as the pressure is not significantly elevated. In regards to her atrial fibrillation, appears to be sinus now, continue the Coumadin, beta blocker at  lower dose.  I would continue cholesterol medicine. I would consult Cardiology, Dr. Rockey Situ, as well as obtain an echocardiogram for the congestive heart failure,  monitor ins and outs and monitor her clinically. She has no chest pains. I will check an INR as well to better gauge the dosage of the Coumadin.   CODE STATUS:  The patient is LIMITED CODE, NO INTUBATION or MECHANICAL VENTILATION.   TOTAL TIME SPENT:  65 minutes.   ____________________________ Vivien Presto, MD sa:jm D: 07/17/2012 14:52:45 ET T: 07/17/2012 15:34:30 ET JOB#: [id number]  cc: Vivien Presto, MD, <Dictator> Minna Merritts, MD Deborra Medina, MD Vivien Presto MD ELECTRONICALLY SIGNED 07/27/2012 20:06

## 2014-06-16 NOTE — Discharge Summary (Signed)
PATIENT NAME:  Madeline Perez, Madeline Perez MR#:  [id number] DATE OF BIRTH:  March 31, 1923  DATE OF ADMISSION:  07/17/2012 DATE OF DISCHARGE:  07/21/2012     ADMITTING DIAGNOSES:  1.  Acute on chronic diastolic congestive heart failure.  2.  Hypertension.  3.  Hyperlipidemia.  4.  Chronic atrial fibrillation.  5.  Chronic kidney disease.  6.  Electrolyte imbalances, status post replacement.   CONSULTANTS: Dr. Mariah Milling.  PERTINENT LABORATORIES AND EVALUATIONS: EKG on admission showed normal sinus rhythm, left axis deviation, LVH. Urinalysis showed 1+ leukocytes, 1+ bacteria. INR 2.2. Troponin less than 0.02. BNP was 5821. Glucose 120, BUN 23, creatinine 1.24, sodium 137, potassium 4.0, chloride 104; CO2 was 27. LFTs showed a bilirubin total of 1.1; alkaline phosphatase was 142. WBC count was 15.5, hemoglobin 10.7, platelet count 209. Chest x-ray showed findings consistent with pulmonary edema, bilateral small pleural effusions. Most recent WBC on May 26 was 9.0. Creatinine today is 1.37, which is close to her baseline. INR today is 2.2.   HOSPITAL COURSE: Please refer to H and P done by the admitting physician. The patient is an 81 year old white female with history of diastolic CHF, chronic kidney disease, A. fib, who presented with shortness of breath and 5-pound weight gain. The patient was seen in the ED and had a chest x-ray, which was suggestive of congestive heart failure. The patient was admitted and started on IV Lasix. She started slowly improving. She is doing much better. She has been very anxious to go home from the day she was admitted. Her breathing is improved. She is continuing to require oxygen. At this time, arrangements for home oxygen are made. She was also followed by her cardiologist during hospitalization, who agreed with our current management. At this time, she is stable for discharge. Discharge instructions for CHF given.   DISCHARGE MEDICATIONS: Propafenone 225 one tab p.o. t.i.d.,  allopurinol 100 daily, Ocuvite PreserVision 1 tab p.o. daily, Crestor 10 daily, Klor-Con 20 mEq daily, metoprolol succinate 50 mg daily, pramipexole 0.25 at bedtime, trazodone 25 mg at bedtime, warfarin 2 mg at bedtime, Lasix 40 one tab p.o. b.i.d. x 4 days, then p.r.n. as using before for swelling.   HOME HEALTH SERVICES: With physical therapy and nurse.   HOME OXYGEN: Two liters nasal cannula.   DIET: Low-sodium, low-fat, low-cholesterol.   ACTIVITY: As tolerated.   FOLLOWUP: With primary MD, Dr. Darrick Huntsman, in 1 to 2 weeks and Dr. Mariah Milling in 1 to 2 weeks.   TIME SPENT: 35 minutes spent on the discharge.    ____________________________ Lacie Scotts. Allena Katz, MD shp:jm D: 07/21/2012 13:32:44 ET T: 07/21/2012 15:18:08 ET JOB#: [id number]  cc: Khila Papp H. Allena Katz, MD, <Dictator> Charise Carwin MD ELECTRONICALLY SIGNED 07/22/2012 14:57

## 2014-06-16 NOTE — Consult Note (Signed)
General Aspect 81 year old Caucasian female with history of diastolic CHF, chronic kidney disease and a-fib, moderate aortic valve stenosis,  moderate pulmonary HTN on previous echo,  living by herself for the past month as her husband has been recently transferred to long-term care, presenting with SOB, diastolic CHF, acute on chronic.  Weight has been trending upwards over the past few weeks.  The patient is eating TV dinners and canned foods. She was  seen by a PA at her PCP's office and told to stay on same lasix regimen.  She is gained about 5 pounds in the last week with increased dyspnea on exertion and shortness of breath. She had dry cough this morning and has had wheezing as well.   She presented to the hospital where she was found to have severe hypoxemia with O2 sat of about 68% on room air, and she was also placed on oxygen with sats currently in the 90 range. The patient feels short of breath and is having audible wheezing. She was given a dose of Lasix x 2 so far and starting to feel better.   Physical Exam:  GEN well developed, well nourished, no acute distress   HEENT hearing intact to voice, moist oral mucosa   NECK supple   RESP normal resp effort  rhonchi  b/l   CARD Irregular rate and rhythm  Murmur   Murmur Systolic   ABD denies tenderness  soft   LYMPH negative neck   EXTR negative edema   SKIN normal to palpation   NEURO motor/sensory function intact   PSYCH alert, A+O to time, place, person, good insight   Review of Systems:  Subjective/Chief Complaint SOB,   General: No Complaints   Skin: No Complaints   ENT: No Complaints   Eyes: No Complaints   Neck: No Complaints   Respiratory: Short of breath   Cardiovascular: Dyspnea   Gastrointestinal: No Complaints   Genitourinary: No Complaints   Vascular: No Complaints   Musculoskeletal: No Complaints   Neurologic: No Complaints   Hematologic: No Complaints   Endocrine: No Complaints    Psychiatric: No Complaints   Review of Systems: All other systems were reviewed and found to be negative   Medications/Allergies Reviewed Medications/Allergies reviewed     COPD:    Hypercholesterolemia:    Hypertension:    Atrial Fibrillation:    Cholecystectomy: 2002  Home Medications: Medication Instructions Status  propafenone 225 mg oral tablet 1 tab(s) orally 3 times a day  Active  allopurinol 100 mg oral tablet 1 tab(s) orally once a day Active  amLODIPine 10 mg oral tablet 0.5 tab (38m) orally once a day. Active  Ocuvite PreserVision 1 tab(s) orally once a day Active  Crestor 10 mg oral tablet 1 tab(s) orally once a day Active  furosemide 40 mg oral tablet 1 tab(s) orally 2 times a day as needed. Active  Klor-Con M20 20 mEq oral tablet, extended release 1 tab(s) orally once a day Active  metoprolol succinate 50 mg oral tablet, extended release 1 tab(s) orally once a day Active  pramipexole 0.25 mg oral tablet 1 tab(s) orally once a day (at bedtime) Active  traZODone 50 mg oral tablet 0.5 tab (223m orally once a day (at bedtime) as needed for sleep. Active  warfarin 2 mg oral tablet 1 tab(s) orally once a day (at bedtime) Active   Lab Results:  Thyroid:  25-May-14 04:04   Thyroid Stimulating Hormone 0.772 (0.45-4.50 (International Unit)  ----------------------- Pregnant patients  have  different reference  ranges for TSH:  - - - - - - - - - -  Pregnant, first trimetser:  0.36 - 2.50 uIU/mL)  Routine Chem:  25-May-14 04:04   Result Comment TROPONIN - RESULTS VERIFIED BY REPEAT TESTING.  - PREV CALLED @ 2052 07-17-12 BY [facility]  Result(s) reported on 18 Jul 2012 at 05:41AM.  Glucose, Serum 98  BUN  23  Creatinine (comp)  1.41  Sodium, Serum 136  Potassium, Serum  3.3  Chloride, Serum 100  CO2, Serum 28  Calcium (Total), Serum  8.4  Anion Gap 8  Osmolality (calc) 276  eGFR (African American)  39  eGFR (Non-African American)  34 (eGFR values  <49m/min/1.73 m2 may be an indication of chronic kidney disease (CKD). Calculated eGFR is useful in patients with stable renal function. The eGFR calculation will not be reliable in acutely ill patients when serum creatinine is changing rapidly. It is not useful in  patients on dialysis. The eGFR calculation may not be applicable to patients at the low and high extremes of body sizes, pregnant women, and vegetarians.)  Magnesium, Serum  1.5 (1.8-2.4 THERAPEUTIC RANGE: 4-7 mg/dL TOXIC: > 10 mg/dL  -----------------------)  Hemoglobin A1c ([facility]) 4.7 (The American Diabetes Association recommends that a primary goal of therapy should be <7% and that physicians should reevaluate the treatment regimen in patients with HbA1c values consistently >8%.)  Cholesterol, Serum 123  Triglycerides, Serum 85  HDL (INHOUSE) 60  VLDL Cholesterol Calculated 17  LDL Cholesterol Calculated 46 (Result(s) reported on 18 Jul 2012 at 05:30AM.)  Cardiac:  25-May-14 04:04   CK, Total 86  CPK-MB, Serum 0.8 (Result(s) reported on 18 Jul 2012 at 05:38AM.)  Troponin I  0.10 (0.00-0.05 0.05 ng/mL or less: NEGATIVE  Repeat testing in 3-6 hrs  if clinically indicated. >0.05 ng/mL: POTENTIAL  MYOCARDIAL INJURY. Repeat  testing in 3-6 hrs if  clinically indicated. NOTE: An increase or decrease  of 30% or more on serial  testing suggests a  clinically important change)  Routine Hem:  25-May-14 04:04   WBC (CBC) 11.0  RBC (CBC)  3.52  Hemoglobin (CBC)  11.4  Hematocrit (CBC)  32.7  Platelet Count (CBC) 173  MCV 93  MCH 32.4  MCHC 34.9  RDW 14.4  Neutrophil % 84.3  Lymphocyte % 9.0  Monocyte % 6.0  Eosinophil % 0.2  Basophil % 0.5  Neutrophil #  9.3  Lymphocyte # 1.0  Monocyte # 0.7  Eosinophil # 0.0  Basophil # 0.0 (Result(s) reported on 18 Jul 2012 at 05:14AM.)   Radiology Results: XRay:    24-May-14 12:58, Chest PA and Lateral  Chest PA and Lateral   REASON FOR EXAM:    hypoxic,  sob  COMMENTS:       PROCEDURE: DXR - DXR CHEST PA (OR AP) AND LATERAL  - Jul 17 2012 12:58PM     RESULT: Comparison: 12/08/2009    Findings:  The heart and mediastinum are stable. Mild hilar prominence is likely   secondary to enlarged central pulmonary vasculature. There are diffuse   heterogeneous interstitial opacities. There are small bilateral pleural   effusions. There is a small nodular opacity overlying the periphery of   the right upper lung which is likely related to a confluence of the   interstitial opacities. Mild anterior wedge compression deformity in the   lower thoracic spine is similar to prior.  IMPRESSION:   1. Findings of pulmonary edema, with small bilateral  pleural effusions.  2. Small nodular density overlying the right upper lung is likely   artifactual secondary to a confluence of the interstitial opacities.   However, followup PA and lateral chest radiograph is recommended.        Verified By: Gregor Hams, M.D., MD    No Known Allergies:   Vital Signs/Nurse's Notes: **Vital Signs.:   25-May-14 08:00  Vital Signs Type Routine  Temperature Temperature (F) 100.1  Celsius 37.8  Temperature Source oral  Pulse Pulse 67  Respirations Respirations 20  Systolic BP Systolic BP 940  Diastolic BP (mmHg) Diastolic BP (mmHg) 44  Mean BP 68  Pulse Ox % Pulse Ox % 91  Pulse Ox Activity Level  At rest  Oxygen Delivery 6L    Impression 81 year old Caucasian female with history of diastolic CHF, chronic kidney disease and a-fib, moderate aortic valve stenosis,  moderate pulmonary HTN on previous echo,  living by herself for the past month as her husband has been recently transferred to long-term care, presenting with SOB, diastolic CHF, acute on chronic.  1) Respiratory distress: From Acute on chronic diastolic CHF, pulmonary HTN poor diet Would continue lasix BID No need for echo as one done recently Moderate left ear, moderate pulmonary HTN, normal  EF --Wean oxygen as tolerated.   2) Moderate aortic valve stenosis nonsurgical at this time,  contributing to diastolic CHF  3) Atrial fib: maintaining NSR   Electronic Signatures: Ida Rogue (MD)  (Signed 25-May-14 12:42)  Authored: General Aspect/Present Illness, History and Physical Exam, Review of System, Past Medical History, Home Medications, Labs, Radiology, Allergies, Vital Signs/Nurse's Notes, Impression/Plan   Last Updated: 25-May-14 12:42 by Ida Rogue (MD)

## 2014-06-17 NOTE — H&P (Signed)
PATIENT NAME:  Madeline Perez, Madeline Perez MR#:  [id number] DATE OF BIRTH:  [birth year]  DATE OF ADMISSION:  05/10/2013  ADMITTING PHYSICIAN: Gladstone Lighter, M.D.   PRIMARY CARE PHYSICIAN: Dr. Derrel Nip.   PRIMARY CARDIOLOGIST: Dr. Ida Rogue.   CHIEF COMPLAINT: Fall and right hip fracture.   HISTORY OF PRESENT ILLNESS: Madeline Perez is an 81 year old Caucasian female with past medical history significant for chronic diastolic congestive heart failure with recent admission 1 week ago for CHF exacerbation, severe aortic stenosis and in the process of getting aortic valve replacement surgery done,  paroxysmal Afib  on Coumadin therapy, CKD stage III, hypertension, hyperlipidemia, presented to the hospital after she had a fall, which is more mechanical in nature resulting in a right hip fracture. The patient states she lives usually at [facility] independent facility, but since her discharge last week after CHF exacerbation treatment, she was sent over to the rehab part of [facility]. Usually, she has been steady at baseline using a cane as needed, but she was trying to get a pillow from underneath her when she was sitting down, and stood up to put the pillow back in and accidentally had a fall. Since her last discharge, she denies any chest pain or difficulty breathing. Her breathing has been doing fine. She is not on any home oxygen and denies any weight gain at this time.   PAST MEDICAL HISTORY: 1.  Hypertension.  2.  History of diastolic congestive heart failure.  3.  Severe aortic stenosis.  4.  Chronic atrial fibrillation on Coumadin.  5.  Hyperlipidemia.   PAST SURGICAL HISTORY: Significant for cholecystectomy.   ALLERGIES: No known drug allergies.   CURRENT HOME MEDICATIONS:  1.  Allopurinol 100 mg p.o. daily.  2.  Norvasc 5 mg p.o. daily.  3.  Crestor 10 mg p.o. daily.  4.  Klor-Con 20 mEq p.o. daily.  5.  Lasix 40 mg p.o. daily.  6.  Metoprolol 50 mg p.o. daily.  7.  Ocuvite PreserVision  1 tablet p.o. daily.  8.  Pramipexole 0.25 mg at bedtime.  9.  Propafenone 225 mg p.o. 3 times a day.  10.  Tramadol 50 mg 3 times a day as needed for pain.  11.  Trazodone 25 mg at bedtime.  12.  Warfarin 2 mg daily.   SOCIAL HISTORY: As mentioned above, the patient is a resident at [facility] independent living facility, but since the past week, since the last discharge, she has been at the rehab portion. No history of smoking or drinking.   FAMILY HISTORY: Significant for heart disease in the family.    REVIEW OF SYSTEMS:   CONSTITUTIONAL: No fever, fatigue or weakness.  EYES: No blurred vision, double vision, inflammation or glaucoma. Uses reading glasses.  ENT: No tinnitus, ear pain, hearing loss, epistaxis or discharge.  RESPIRATORY: No cough, wheeze, hemoptysis or COPD.  CARDIOVASCULAR: No chest pain, orthopnea, edema, arrhythmia. Positive for occasional palpitations.  GASTROINTESTINAL: No nausea, vomiting, diarrhea, abdominal pain, hematemesis or melena.  GENITOURINARY: No dysuria, hematuria, renal calculus, frequency or incontinence.  ENDOCRINE: No polyuria, nocturia, thyroid problems, heat or cold intolerance.  HEMATOLOGY: No anemia, easy bruising or bleeding.  SKIN: No acne, rash or lesions.  MUSCULOSKELETAL: Positive for right hip pain since the fracture.  NEUROLOGIC: No numbness, weakness, CVA, TIA or seizures.  PSYCHOLOGICAL: No anxiety, insomnia, depression.   PHYSICAL EXAMINATION: VITAL SIGNS: Temperature 97.8 degrees Fahrenheit, pulse 62, respirations 18, blood pressure 129/67, pulse ox 94% on room  air.  GENERAL: Well-built, well-nourished female lying in bed, not in any acute distress.  HEENT: Normocephalic, atraumatic. Pupils equal, round, reacting to light. Anicteric sclerae. Extraocular movements intact. Oropharynx is clear without erythema, mass or exudates.  NECK: Supple. No thyromegaly, JVD or carotid bruits. No lymphadenopathy.  LUNGS: Moving air bilaterally.  Fine bibasilar crackles present. No use of accessory muscles for breathing.  CARDIOVASCULAR: S1, S2, regular rate and rhythm. Very loud 4/6 systolic murmur in the aortic area.  No rubs or gallops.  ABDOMEN: Soft, nontender, nondistended. No hepatosplenomegaly. Normal bowel sounds.  EXTREMITIES: No pedal edema. No clubbing or cyanosis, 2+ dorsalis pedis pulses palpable bilaterally. The right leg is externally rotated and abducted.  SKIN: No acne, rash or lesions.  LYMPHATICS: No cervical lymphadenopathy.  NEUROLOGIC: Cranial nerves intact. No focal motor or sensory deficits.  PSYCHOLOGICAL:  The patient is awake, alert and oriented x 3.   LABORATORY DATA: WBC 9.2, hemoglobin 12.3, hematocrit 37.9, platelet count 207.   Sodium 139, potassium 3.9, chloride 106, bicarb 28, BUN 37, creatinine 1.72, glucose 137 and calcium of 8.6.   ALT 16, AST 18, alk phos 152, total bili 0.4 and albumin of 3.2. INR is 2.1. Right hip x-ray showing the displaced comminuted and angulated intertrochanteric fracture of the right femur. Chest x-ray showing persistent but improving congestive heart failure and interstitial pulmonary edema. URINALYSIS: Negative for any infection.    EKG showing normal sinus rhythm, left axis deviation and LVH pattern. Heart rate of 66. No acute ST-T wave abnormalities.   ASSESSMENT AND PLAN: An 81 year old female with history of severe aortic stenosis, diastolic congestive heart failure with recent admission for congestive heart failure exacerbation, chronic kidney disease stage III, paroxysmal atrial fib on Coumadin, comes in after fall and right hip fracture.  1.  Fall and right hip fracture. Preop eval for hip fracture. The patient is a high risk for surgery due to her severe aortic stenosis and CHF with recent exacerbation. Would advise against surgery at this time until cardiology evaluates her. She is not a good candidate for surgery at all at this time. Ortho has been consulted.  We  will give pain medications.  2.  Paroxysmal atrial fibrillation on Coumadin. We will hold Coumadin at this time. No need to reverse anticoagulation, as she probably will not have surgery tomorrow. She is rate controlled on metoprolol. We will continue that. The patient is also on propafenone, which she will continue at this time.  3.  Diastolic congestive heart failure with recent exacerbation. Hold off on the IV fluids and continue Lasix at this time. Chest x-ray shows improvement, but still has some interstitial findings. Cardiology has been consulted.  4.  Chronic kidney disease  stage III, continue to monitor.  5. Severe aortic stenosis. The patient is in the process of discussing about aortic valve replacement surgery due to her symptoms. She is a high risk for any surgery and will need cardiology evaluation.  6.  Hyperlipidemia, on Crestor.   CODE STATUS: FULL CODE.   TIME SPENT ON ADMISSION: 50 minutes.     ____________________________ Gladstone Lighter, MD rk:dmm D: 05/10/2013 21:36:00 ET T: 05/10/2013 22:29:09 ET JOB#: [id number]  cc: Gladstone Lighter, MD, <Dictator> Minna Merritts, MD Deborra Medina, MD Gladstone Lighter MD ELECTRONICALLY SIGNED 06/02/2013 13:52

## 2014-06-17 NOTE — H&P (Signed)
PATIENT NAME:  Madeline Perez, Madeline Perez MR#:  [id number] DATE OF BIRTH:  10/06/1923  DATE OF ADMISSION:  05/02/2013  PRIMARY CARE PHYSICIAN: Dr. Darrick Huntsman.   EMERGENCY ROOM PHYSICIAN: Dr. Ethelda Chick.   CHIEF COMPLAINT:   Shortness of breath.  HISTORY OF PRESENT ILLNESS: An 81 year old female patient with history of diastolic heart failure who comes in because of shortness of breath, wheezing and she checked her O2 sats at home; it was 84% on room air. Because of that, she came to the hospital. In the ER, the patient was found to have a BNP of 5030, and we are admitting her for acute on chronic diastolic heart failure. The patient says that she is having some trouble breathing for 2 to 3 days, also associated with wheezing and also some cough. The patient denies any chest pain. The patient denies any orthopnea or PND. The patient has other complaints of low back pain going on for about 10 days. The patient was having back pain for about 1 week. Seen in the Emergency Room on March 4th. Discharged home with pain medications. The patient has an appointment with Dr. Darrick Huntsman on Wednesday to get an orthopedic appointment.  The patient right now has back pain in lower back, around 7/10 in severity and not radiating to the legs. No numbness in the legs, and the patient denies any history of fall or trauma to the back. The patient did have a hip x-rays  on March 4th but did not have any x-rays of the back. The patient denies any other complaints and she says her back pain is a little better.   PAST MEDICAL HISTORY: Significant for hypertension, history of diastolic heart failure, aortic stenosis, chronic atrial fibrillation. The patient also h as hyperlipidemia. Follows up with Dr. Mariah Milling for CHF and atrial fibrillation.    PAST SURGICAL HISTORY: Significant for cholecystectomy.   ALLERGIES: No allergies.   FAMILY HISTORY: Significant for family members with coronary artery disease.   SOCIAL HISTORY: The patient denies  any smoking or drinking.   MEDICATIONS AT HOME:  Allopurinol 100 mg p.o. daily, amlodipine 5 mg p.o. daily, Crestor 10 mg p.o. daily, Lasix 40 mg p.o. daily, KCl 20 mEq extended release daily, metoprolol  succinate 50 mg p.o. daily, Ocuvite, PreserVision 1 tablet p.o. daily, oxycodone 5 mg p.o. every 6 hours, ( pramipixole  0.25 mg once a day, trazodone 50 mg half tablet at bedtime, propafenone 225 mg p.o. t.i.d., warfarin 2 mg p.o. daily.   REVIEW OF SYSTEMS:   CONSTITUTIONAL: No fever. No fatigue.  EYES: No blurred vision.  ENT: No tinnitus. No epistaxis. No difficulty swallowing.  RESPIRATORY: Has some cough and trouble breathing and wheezing.  CARDIOVASCULAR: Has no chest pain. No orthopnea. No PND. No pedal edema.  GASTROINTESTINAL: No nausea. No vomiting. No diarrhea.  GENITOURINARY: No dysuria.  ENDOCRINE: No polyuria or nocturia.  INTEGUMENTARY: No skin rashes.  MUSCULOSKELETAL: Complains of back pain.  NEUROLOGIC: No numbness or weakness in legs. PSYCHIATRIC:  No anxiety,  PHYSICAL EXAMINATION: VITAL SIGNS: Temperature is 98.6, heart rate 72, blood pressure 167/72, sats 94%. Right now, she is on 2 liters, sats are around 95%, but they quickly dropped down to 88% to 89% without oxygen. HEAD: Atraumatic, normocephalic.  EYES: Pupils equally reacting to light. Extraocular movements are intact.  NOSE: No turbinate hypertrophy. No drainage.  EARS: No drainage. No external lesions.  MOUTH: No lesions. No exudates.  NECK: Supple. No JVD. No carotid bruit.  RESPIRATORY: The patient  has been bilateral crepitations present.  Also expiratory wheeze present in all lung fields. Good respiratory effort.    CARDIOVASCULAR: S1, S2 regular. Ejection systolic murmur heard in the aortic area, and the patient right now is in sinus rhythm.  GASTROINTESTINAL: Abdomen is soft, nontender, nondistended. Bowel sounds present.  MUSCULOSKELETAL: The patient's extremities move x 4. The patient is able to  raise her legs. Denies any back pain when she does straight leg raising.  SKIN: Inspection is normal.  NEUROLOGIC: Alert, awake, oriented. No focal neurological deficits.  PSYCHIATRIC: Mood and affect are within normal limits.   LABORATORY DATA:  EKG: Normal sinus rhythm with no ST-T changes. UA is clear.   INR is 2.1. Troponin less than 0.02.   WBC 8.1, hemoglobin 12.1, hematocrit 35.8, platelets 195.   ELECTROLYTES: Sodium is 140, potassium 4.5, chloride 106, bicarb 30, BUN 39, creatinine 1.43, glucose 87.   BNP 5030.   Chest x-ray showed moderate cardiomegaly with emphysema and also interstitial edema.   ASSESSMENT AND PLAN: The patient is an 81 year old female patient with:  1. Acute on chronic diastolic heart failure and possible mild chronic obstructive pulmonary disease flare. The patient will be admitted to hospitalist service on telemetry. Will continue IV Lasix at 60 mg q.12 hours, along with KCL and follow daily weights. The patient to follow up with Dr. Dossie Arbour. We will get cardiology consult.  2.  History of aortic stenosis. The patient is on beta blockers.  3.  History of atrial fibrillation. INR is therapeutic. Continue Coumadin at 2 mg.  4.  The patient has acute low back pain. The patient did not have x-ray of the lumbosacral spine when she was here last time. I am going to order an x-ray of lumbosacral spine, continue her pain medication with oxycodone and also obtain orthopedic consult for her back pain.  5.  Insomnia. Continue trazodone.   TIME SPENT ON HISTORY AND PHYSICAL: 60 minutes.   ____________________________ Katha Hamming, MD sk:dmm D: 05/02/2013 19:55:00 ET T: 05/02/2013 20:18:28 ET JOB#: [id number]  cc: Katha Hamming, MD, <Dictator> Duncan Dull, MD Katha Hamming MD ELECTRONICALLY SIGNED 05/09/2013 11:55

## 2014-06-17 NOTE — Discharge Summary (Signed)
PATIENT NAME:  Madeline Perez, TABOR MR#:  [id number] DATE OF BIRTH:  [birth year]  DATE OF ADMISSION:  05/10/2013 DATE OF DISCHARGE: 06-12-2013   The patient is being transferred to [facility] for further evaluation of her coronary artery disease and aortic stenosis. Please review interim discharge summary dictated by Dr. Cherlynn Kaiser on 05/15/2013.   FINAL DISCHARGE DIAGNOSES:  1.  Fall with right hip fracture, status post pinning, postoperative day 5.  2.  Chest pain due to severe coronary artery disease and tight aortic stenosis. The patient is status post cardiac catheterization. Cardiology recommends coronary artery bypass graft with AVR when feasible.  3.  Paroxysmal atrial fibrillation. The patient will be started on heparin tonight at [facility].  4.  Diastolic congestive heart failure, appears well compensated.  5.  Chronic kidney disease, stage III.  6.  Severe tight aortic stenosis.  7.  Hyperlipidemia.   CODE STATUS: NO CODE, DO NOT RESUSCITATE.   PROCEDURES: Cardiac cath done by Dr. Mariah Milling showed severe proximal RCA disease, estimated at 90%, severe proximal to mid LAD after diagonal takeoff estimated 80%, moderate to severe ostial diagnosed disease, moderate to severe OM2 and OM3 disease. Glucose is 102, BUN is 32, creatinine is 1.27, sodium is 137, potassium is 3.8, chloride is 102, bicarbonate is 31, calcium is 8.1. PT/INR 17.4 and 1.5. This was all on March 24.   Please review interim discharge summary dictated by Dr. Cherlynn Kaiser on March 22. In addition to that addendum for brief hospital course, Ms. Lago continued to have some on and off chest pain and shortness of breath. She was evaluated by cardiology, Dr. Mariah Milling, who recommended a cardiac cath in the setting of her aortic stenosis to rule out CAD. The patient did undergo cardiac catheter on March 24 and was found to have severe coronary artery disease with tight EF and is recommended by cardiology to get CABG with AVR when feasible  after work-up and further evaluation is done at tertiary care center, which is [facility]. The patient has been accepted by Dr. Antoine Poche of cardiology. The patient will be discharged to [facility] later today. Heparin drip will be started at [facility] later tonight for anticoagulation. This recommendation was given by Dr. Mariah Milling. We will hold off on Coumadin in anticipation for further workup and procedures. Remaining of the medical problems remained stable. The patient is improving with physical therapy slowly.   CODE STATUS: NO CODE, DO NOT RESUSCITATE.   FINAL MEDICATION LIST: 1.  Tylenol 650 mg p.o. q.4 hours p.r.n.  2 . Norco 5/325, 1 to 2 q.4 to 6 hours p.r.n.  3.  Bisacodyl suppository 10 mg rectal at bedtime p.r.n.  4.  Docusate 240 mg at bedtime.  5.  Ferrous sulfate 325 mg p.o. b.i.d.  6.  Lasix 40 mg daily.  7.  Metoprolol XL 50 mg at bedtime.  8.  Morphine 2 mg IV push q.4 hours p.r.n.  9.  Potassium chloride 20 mg p.o. daily.  10. Mirapex 0.25 mg p.o. daily at nighttime.  11. Propafenone 225 mg t.i.d.  12. Crestor 10 mg daily.  13. Lactulose 30 mL b.i.d.  14. Flomax 0.4 mg daily.  15. Heparin drip to be started for anticoagulation tonight March 24 at [facility].  16. Oxygen 2 to 4 Ls per minute to keep sats greater than 92%.  17. Physical therapy as tolerated.   DIET: 2 gram sodium diet.   TIME SPENT: 40 minutes.  ____________________________ Wylie Hail Allena Katz,  MD sap:aw D: June 01, 2013 12:47:03 ET T: 2013-06-01 13:18:06 ET JOB#: [id number]  cc: Samreen Seltzer A. Allena Katz, MD, <Dictator> Willow Ora MD ELECTRONICALLY SIGNED 06/02/2013 16:21

## 2014-06-17 NOTE — Op Note (Signed)
PATIENT NAME:  Madeline Perez, Madeline Perez MR#:  [id number] DATE OF BIRTH:  [birth year]  DATE OF PROCEDURE:  05/12/2013  PREOPERATIVE DIAGNOSIS: Right unstable intertrochanteric hip fracture.   POSTOPERATIVE DIAGNOSIS: Right unstable intertrochanteric hip fracture.   PROCEDURE: Open reduction and internal fixation right intertrochanteric hip fracture.   DESCRIPTION OF PROCEDURE: The patient was brought to the operating room and after general anesthesia was obtained, the patient was placed on the operative table. The left leg was placed in a well legholder, the right leg in the traction boot after flexion and traction applied. The leg was brought out into extension and x-ray showed acceptable alignment in AP, lateral projections, although on the lateral view the distal fragment was slightly displaced posteriorly. Hip was prepped and draped using the barrier drape method. After patient identification and timeout procedures were completed, a proximal incision was made and the soft tissue spread, guidewire was inserted into the trochanter. Proximal reaming carried out and a guidewire was inserted down the canal. Measurements were made off of this and reaming was carried out with a 9-mm reamer. The 9 x 360-mm right 130-degree Affixus nail was inserted to the appropriate level. Because of the unstable fracture pattern, an antirotation screw was inserted initially with a 100-mm antirotation screw placed after placing the initial lag screw lag screw guidewire. Reaming was carried out over that guidewire and a 110-mm lag screw was inserted. Traction was released at this point and compression applied across the fracture site. The insertion handle was removed after AP, lateral images obtained proximally. With the leg slightly abducted, a distal interlocking screw was then placed through the slotted hole to provide rotational stability. A small stab incision was made laterally, drilling, measuring, and then placing a 5.0 cortical  screw. At this point, the fracture appeared to be stable. The wounds were irrigated. Deep fascia repaired using 0 Vicryl, 2-0 Vicryl subcutaneously, and skin staples. Xeroform, 4 x 4's, ABD, and tape applied.   ESTIMATED BLOOD LOSS: 250.   COMPLICATIONS: None.   SPECIMEN: None.   IMPLANTS:  [employer]omet Affixus hip fracture nail system, right 130 degrees, 9 x 360 mm with 110-mm lag screw, 100-mm antirotation screw, and 44-mm distal cortical screw.   CONDITION: To recovery room stable.  ____________________________ Leitha Schuller, MD mjm:am D: 05/12/2013 19:43:14 ET T: 05/13/2013 02:17:40 ET JOB#: [id number]  cc: Leitha Schuller, MD, <Dictator> Leitha Schuller MD ELECTRONICALLY SIGNED 05/13/2013 8:54

## 2014-06-17 NOTE — Consult Note (Signed)
General Aspect Madeline Perez is a 81yo female w/ PMHx s/f chronic diastolic CHF, severe aortic stenosis, PAF, chronic Coumadin anticoagulation, CKD (stage III; baseline Cr 1.3-1.4), HTN, HLD and chronic LE edema who was admitted to [facility] yesterday w/ mechanical fall and resultant R hip fracture. Cardiology consulted for pre-op eval in this setting.  She is well known to [place]. Last seen in follow-up 03/31/13. Euvolemic weight ~151-154 lbs. CHF well-compensated at that time. Maintaining NSR. Majority of visit dedicated to discussing AV replacement after echo in 02/2013 revealed AS had advanced from moderate in 2013 to severe. Echo- preserved EF, moderate concentric LVH, grade 2 diastolic dysfunction, severe AS- Valve area: 0.86cm^2(VTI), Valve area: 0.91cm^2 (Vmax), mild AI, mild MR, mild LAE. She expressed an interest in TAVR. Plan was for careful symptom monitoring and referral to Drs. Excell Seltzer and Ivins later this year. Again, well-compensated that visit.   She has had multiple admissions for decompensated CHF in the setting of diuretic withdrawal and salt indiscretion including her most recent admission 3/9 to 3/11. She diuresed appropriately on IV Lasix, then was transitioned back to PO Lasix and discharged to [facility] rehab. Incidental vertebral fx identified that admission. Pain management and outpatient ortho referral recommended.   She had been doing well. No SOB/DOE, PND, orthopnea, LE edema, abdominal distention or weight gain. She was attempting to move a pillow from underneath her. When she stood to do this, she lost her balance and stumbled backwards thereby falling on her R side. She experienced considerable R leg pain. She was subsequently transferred to [facility] ED.   Present Illness There, R hip radiograph revealed displaced comminuted and angulated intertrochanteric fracture right femur on a background of osseos demineralization. CXR- persistent but improving congestive heart failure and  interstitial pulmonary edema. Weight 154 lbs. EKG revealed NSR, RBBB, IVCD, LAD downlsoping ST depressions w/ TWIs I, aVL c/w prior tracings. Labwork- BUN 37/Cr 1.72, albumin 3.2, INR 2.1. CBC & U/a unremarkable.  She was admitted by the medicine service. Ortho was consulted who recommended ORIF pending pre-op evaluation.   Labwork today reveals BUN 31/1.41, Hgb 10.4/Hct 30.7. INR is 2.3 despite holding Coumadin.   PAST MEDICAL HISTORY: Significant for hypertension, history of diastolic heart failure, aortic stenosis, chronic atrial fibrillation. The patient also h as hyperlipidemia. Follows up with Dr. Mariah Milling for CHF and atrial fibrillation.    PAST SURGICAL HISTORY: Significant for cholecystectomy.   ALLERGIES: No allergies.   FAMILY HISTORY: Significant for family members with coronary artery disease.   SOCIAL HISTORY: The patient denies any smoking or drinking.   Physical Exam:  GEN well developed, well nourished, thin   HEENT hearing intact to voice, moist oral mucosa   NECK JVP 6cm, no bruits   RESP normal resp effort  clear BS  no use of accessory muscles   CARD Regular rate and rhythm  Normal, S1, S2  III/VI systolic ejection murmur   ABD denies tenderness  soft  normal BS   EXTR negative cyanosis/clubbing, negative edema   SKIN normal to palpation, limited RLE ROM, compression wrap appreciated   NEURO follows commands, motor/sensory function intact, unable to move right LE   PSYCH alert, A+O to time, place, person   Review of Systems:  Subjective/Chief Complaint right leg pain   General: No Complaints    Skin: No Complaints    ENT: No Complaints    Eyes: No Complaints    Neck: No Complaints    Respiratory: No Complaints  Cardiovascular: No Complaints    Gastrointestinal: No Complaints    Genitourinary: No Complaints    Vascular: No Complaints    Musculoskeletal: right leg pain with movement    Neurologic: No Complaints    Hematologic: No  Complaints    Endocrine: No Complaints    Psychiatric: No Complaints    Review of Systems: All other systems were reviewed and found to be negative    Medications/Allergies Reviewed Medications/Allergies reviewed        COPD:    Hypercholesterolemia:    Hypertension:    Atrial Fibrillation:    Cholecystectomy: 2002  Home Medications: Medication Instructions Status  amLODIPine 5 mg oral tablet 1 tab(s) orally once a day (8 am) Active  Klor-Con 20 mEq oral tablet, extended release 1 tab(s) orally once a day (8 am) Active  metoprolol succinate 50 mg oral tablet, extended release 1 tab(s) orally once a day (at bedtime) (8 pm) Active  warfarin 2 mg oral tablet 1 tab(s) orally once a day (in the evening) (5 pm) Active  Crestor 10 mg oral tablet 1 tab(s) orally once a day (in the evening) (5 pm) Active  Lasix 40 mg oral tablet 1 tab(s) orally once a day (8 am) Active  Ocuvite PreserVision Antioxidant Multiple Vitamins and Minerals oral tablet 1 tab(s) orally once a day (at bedtime) (8 pm) Active  traMADol 50 mg oral tablet 1 tab(s) orally 3 times a day, As Needed - for Pain Active  propafenone 225 mg oral tablet 1 tab(s) orally 3 times a day (8 am, 2 pm, 8 pm) Active  allopurinol 100 mg oral tablet 1 tab(s) orally once a day (8 am) Active  pramipexole 0.25 mg oral tablet 1 tab(s) orally once a day (at bedtime) (8 pm) Active  traZODone 50 mg oral tablet 0.5 tab(s) orally once a day (at bedtime), As Needed for sleep Active   EKG:  Interpretation EKG shows NSR, RBBB, IVCD, LAD, downsloping ST depressions w/ associated TWIs I, aVL   Rate 61   EKG Comparision Not changed from  prior office tracings   Radiology Results: XRay:    17-Mar-15 18:14, Chest 1 View AP or PA  Chest 1 View AP or PA   REASON FOR EXAM:    Preop  COMMENTS:       PROCEDURE: DXR - DXR CHEST 1 VIEWAP OR PA  - May 10 2013  6:14PM     CLINICAL DATA:  Shortness of breath.    EXAM:  CHEST - 1  VIEW    COMPARISON:  DG CHEST 2V dated 05/02/2013    FINDINGS:  Mediastinum and hilar structures normal. Cardiomegaly with pulmonary  vascular prominence and mild interstitial prominence noted  consistent with mild congestive heart failure. These findings at  improved from prior exam. Pleural effusions have improved. No  pneumothorax.     IMPRESSION:  Persistent but improving congestive heart failure and interstitial  pulmonary edema.      Electronically Signed    By: Maisie Fus  Register    On: 05/10/2013 18:20         Verified By: Gwynn Burly, M.D., MD    17-Mar-15 18:14, Hip Right Complete  Hip Right Complete   REASON FOR EXAM:    pain/injury  COMMENTS:       PROCEDURE: DXR - DXR HIP RIGHT COMPLETE  - May 10 2013  6:14PM     CLINICAL DATA:  Injury, pain    EXAM:  RIGHT HIP - COMPLETE  2+ VIEW    COMPARISON:  04/27/2013    FINDINGS:  Osseous demineralization.  Comminuted displaced intertrochanteric fracture right femur with  varus angulation.    No dislocation.    Visualized pelvis intact.    Extensive atherosclerotic calcifications.     IMPRESSION:  Osseous demineralization.    Displaced comminuted and angulated intertrochanteric fracture right  femur.  Electronically Signed    By: Ulyses Southward M.D.    On: 05/10/2013 18:15         Verified By: Lollie Marrow, M.D.,    No Known Allergies:   Vital Signs/Nurse's Notes: **Vital Signs.:   18-Mar-15 04:56  Vital Signs Type Q 4hr  Temperature Temperature (F) 98.5  Celsius 36.9  Temperature Source oral  Pulse Pulse 63  Respirations Respirations 18  Systolic BP Systolic BP 146  Diastolic BP (mmHg) Diastolic BP (mmHg) 57  Mean BP 86  Pulse Ox % Pulse Ox % 94  Pulse Ox Activity Level  At rest  Oxygen Delivery Room Air/ 21 %    Impression 81yo female w/ PMHx s/f chronic diastolic CHF, severe aortic stenosis, PAF, chronic Coumadin anticoagulation, CKD (stage III; baseline Cr 1.3-1.4), HTN, HLD and  chronic LE edema who was admitted to [facility] yesterday w/ mechanical fall and resultant R hip fracture. Cardiology consulted for pre-op eval in this set  1. Mechanical fall w/ R hip fracture Ortho following.  -- ORIF recommended -- Pain control  2. Pre-op evaluation Known severe aortic valve stenosis, no recent angina Diastolic CHF has been an issue secondary to severe left ear and intolerance of lasix (incontinence) Stable from a cardiac standpoint otherwise.   She is maintaining NSR. Weight 154 lbs within euvolemic range.  Acceptable risk for surgery. No further cardiac intervention planned prior to ORIF. -- Continue BB and Lasix -- Minimize IVF peri-operatively -- Keep weight 151-154 lbs -- FFP or vitamin K to reverse INR. Maintaining SR. No need for heparin bridge   3. chronic diastolic CHF Patient euvolemic and at baseline (NYHA class III). -- Recommendations per above  3. Vertebral fracture Old compression fractures of T10 and T11, subacute L3 per radiograph last admissino. -- Pain control  4. Severe aortic valve stenosis Asymptomatic at this time.  -- continue b-blocker -- Manage volume status, BP outpt workup  5. PAF Maintaining NSR. INR therapeutic yesterday and today.  -- Continue Rythmol, Toprol-XL -- Hold Coumadin, vitamin K or FFP to reverse for surgery; restart once ok with ortho -- When INR < 2.0, will need DVT prophylaxis heparin/Lovenox   Plan 6. Hypertension Normotensive -- Continue antihypertensives.  7. CKD, stage III -- Avoid nephrotoxic meds -- Keep euvolemic  8. HLD -- Continue statin   Electronic Signatures: Ketrick Matney A (PA-C)  (Signed 18-Mar-15 08:49)  Authored: General Aspect/Present Illness, History and Physical Exam, Home Medications, EKG , Radiology, Allergies, Vital Signs/Nurse's Notes, Impression/Plan Julien Nordmann (MD)  (Signed 18-Mar-15 09:19)  Authored: History and Physical Exam, Review of System, Past Medical History, EKG ,  Impression/Plan  Co-Signer: General Aspect/Present Illness, History and Physical Exam, Home Medications, EKG , Radiology, Allergies, Vital Signs/Nurse's Notes, Impression/Plan   Last Updated: 18-Mar-15 09:19 by Julien Nordmann (MD)

## 2014-06-17 NOTE — Consult Note (Signed)
Brief Consult Note: Diagnosis: Low back pain.   Patient was seen by consultant.   Recommend further assessment or treatment.   Orders entered.   Comments: 81 year old female has had low back pain for about 10-12 days without injury.  Seen in Emergency Room 04/27/13 and X-rays of pelvis and right hip were normal. Admitted for pulmonary issues.  low back pain some better the last few days.  No radiation of pain.  Hips comfortable. Able to ambulate to bathroom.    Exam:  son present. Alert and cooperative.  Mild low back pain to palpation.  circulation/sensation/motor function good distally and straight leg raise negative. Hips have good range of motion.    X-rays: old L3 and T11 compression fractures.  No acute changes noted.    Imp: low back pain   Rx:   lumbosacral corset         mild heat         Tramadol prn          return to clinic 2 weeks if symptomatic.  Electronic Signatures: Valinda Hoar (MD)  (Signed 10-Mar-15 13:50)  Authored: Brief Consult Note   Last Updated: 10-Mar-15 13:50 by Valinda Hoar (MD)

## 2014-06-17 NOTE — Consult Note (Signed)
General Aspect [name] is a 81yo female w/ PMHx s/f chronic diastolic CHF, severe aortic stenosis, PAF, chronic Coumadin anticoagulation, CKD (stage III), HTN, HLD and chronic LE edema who was admitted to [facility] yesterday w/ A/C diastolic CHF. Cardiology consulted for the same.   She is well known to [place]. Last seen in follow-up 03/31/13. Euvolemic weight ~151-154 lbs. CHF well-compensated at that time. Maintaining NSR. Majority of visit dedicated to discussing AV replacement after echo in 02/2013 revealed AS had advanced from moderate in 2013 to severe. Echo- preserved EF, moderate concentric LVH, grade 2 diastolic dysfunction, severe AS- Valve area: 0.86cm^2(VTI), Valve area: 0.91cm^2 (Vmax), mild AI, mild MR, mild LAE. She expressed an interest in TAVR. Plan was for careful symptom monitoring and referral to Drs. Excell Seltzer and Pierron later this year. Again well-compensated that visit.   She has had multiple admissions for decompensated CHF in the setting of diuretic withdrawal and salt indiscretion.  She has been experiencing 7/10 lumbar pain for which he PCP is managing. She was in her USOH until yesterday when she developed sudden onset of SOB, wheezing and hypoxia (84% by home pulse ox). She reports chronic unchanged LE edema. Mild nonproductive cough. She has noticed a mild weight increase over the past few days (154 lbs recently). Denies chest pain, PND, orthopnea, abdominal distention, palpitations or syncope. She does note liberal diet including microwave dinners and canned food. She did not increase Lasix given urinary incontinence with this. She had been stable prior to this episode. Again, she has had significant low back pain over the past couple weeks.   Present Illness In the ED, BNP returned elevated at 5088. TnI WNL. EKG revealed SR, LBBB, unchanged from prior tracings. CXR- moderate cardiomegaly with emphysema and possible superimposed early interstitial edema. A lumbar spine radiograph was  also obtained revealing- new benign appearing compression fracture of the superior endplate of L3. Old compression fractures of T10 and T11. BUN 39/Cr 1.43. INR therapeutic at 2.1. CBC largely WNL. BP elevated, but not markedly (SBP 160s).   She was determined to be volume overloaded, diagnosed with A/C diastolic CHF, started on Lasix 60mg  IV BID and admitted by the medicine team.   Cardiology has been consulted to assist in management. Total I/O -1540 mL. Admission weight 154 lbs, 151 lbs this AM. BUN 34/Cr 1.48 today.   PAST MEDICAL HISTORY: Significant for hypertension, history of diastolic heart failure, aortic stenosis, chronic atrial fibrillation. The patient also h as hyperlipidemia. Follows up with Dr. Mariah Milling for CHF and atrial fibrillation.    PAST SURGICAL HISTORY: Significant for cholecystectomy.   ALLERGIES: No allergies.   FAMILY HISTORY: Significant for family members with coronary artery disease.   SOCIAL HISTORY: The patient denies any smoking or drinking.   Physical Exam:  GEN no acute distress, thin   HEENT pink conjunctivae, PERRL, hearing intact to voice   NECK supple  No masses  trachea midline  JVP 7 cm, no bruits   RESP normal resp effort  no use of accessory muscles  fine bibasilar rales, no wheezes or rhonchi   CARD Regular rate and rhythm  III/VI mid peaking systolic crescendo-decrescendo murmur, soft S2, no S3 or S4   ABD denies tenderness  soft  normal BS   EXTR negative cyanosis/clubbing, trace bilateral pretibial edema, low back pain on active lumbar flexion   SKIN normal to palpation   NEURO follows commands, motor/sensory function intact   PSYCH alert, A+O to time, place, person  Review of Systems:  Subjective/Chief Complaint shortness of breath and back pain   General: Fatigue    Skin: No Complaints    ENT: No Complaints    Eyes: No Complaints    Neck: No Complaints    Respiratory: Short of breath  Wheezing   Cardiovascular:  Dyspnea  Edema   Gastrointestinal: No Complaints    Genitourinary: Frequent urination  incontinence   Vascular: No Complaints    Musculoskeletal: Muscle or Joint Stiffness  low back pain   Neurologic: No Complaints    Hematologic: No Complaints    Endocrine: No Complaints    Psychiatric: No Complaints    Review of Systems: All other systems were reviewed and found to be negative   Medications/Allergies Reviewed Medications/Allergies reviewed    Home Medications: Medication Instructions Status  furosemide 40 mg oral tablet 1 tab(s) orally once a day Active  Ocuvite PreserVision Antioxidant Multiple Vitamins and Minerals oral tablet 1 tab(s) orally once a day Active  amLODIPine 5 mg oral tablet 1 tab(s) orally once a day Active  oxyCODONE 5 mg oral tablet 1 tab(s) orally every 6 hours Active  Crestor 10 mg oral tablet 1 tab(s) orally once a day (at bedtime) Active  propafenone 225 mg oral tablet 1 tab(s) orally 3 times a day  Active  allopurinol 100 mg oral tablet 1 tab(s) orally once a day Active  Klor-Con M20 20 mEq oral tablet, extended release 1 tab(s) orally once a day Active  metoprolol succinate 50 mg oral tablet, extended release 1 tab(s) orally once a day Active  pramipexole 0.25 mg oral tablet 1 tab(s) orally once a day (at bedtime) Active  traZODone 50 mg oral tablet 0.5 tab (25 mg) orally once a day (at bedtime) as needed for sleep Active  warfarin 2 mg oral tablet 1 tab(s) orally once a day (at bedtime) Active   Lab Results:  Routine Chem:  09-Mar-15 17:32   B-Type Natriuretic Peptide [facility])  5030 (Result(s) reported on 02 May 2013 at 06:07PM.)  Cardiac:  09-Mar-15 17:32   Troponin I < 0.02 (0.00-0.05 0.05 ng/mL or less: NEGATIVE  Repeat testing in 3-6 hrs  if clinically indicated. >0.05 ng/mL: POTENTIAL  MYOCARDIAL INJURY. Repeat  testing in 3-6 hrs if  clinically indicated. NOTE: An increase or decrease  of 30% or more on serial  testing suggests a   clinically important change)   EKG:  Interpretation NSR, LBBB, poor R wave progression, LAD   Rate 73   EKG Comparision Not changed from  03/31/13 tracing   Radiology Results:  XRay:    09-Mar-15 17:53, Chest PA and Lateral  Chest PA and Lateral   REASON FOR EXAM:    dyspnea  COMMENTS:       PROCEDURE: DXR - DXR CHEST PA (OR AP) AND LATERAL  - May 02 2013  5:53PM     CLINICAL DATA:  Shortness of breath    EXAM:  CHEST  2 VIEW    COMPARISON:  DG CHEST 2V dated 07/20/2012; DG CHEST 2V dated  07/17/2012    FINDINGS:  Hyperinflation and prominence of the reticular markings compatible  with emphysema reidentified. Heart size is mildly enlarged with  prominence of the main pulmonary arteries which may suggest  pulmonary arterial hypertension. Trace if any right pleural  effusion. There is a suggestion of a few Kerley B-lines at the lung  bases. No acute osseous abnormality.     IMPRESSION:  Moderate cardiomegaly with emphysema and  possible superimposed early  interstitial edema.      Electronically Signed    By: Christiana Pellant M.D.    On: 05/02/2013 18:02     Verified By: Harrel Lemon, M.D.,    09-Mar-15 20:32, Lumbar Spine AP and Lateral  Lumbar Spine AP and Lateral   REASON FOR EXAM:    back pain  COMMENTS:       PROCEDURE: DXR - DXR LUMBAR SPINE AP AND LATERAL  - May 02 2013  8:32PM     CLINICAL DATA:  Low back pain.    EXAM:  LUMBAR SPINE - 2-3 VIEW    COMPARISON:  Chest x-rays dated 07/17/2012 and 12/09/2011    FINDINGS:  There is a new slight compression fracture of the superior endplate  of L3. No bone destruction. There are old compression fractures of  T10 and T11, stable since 12/09/2011.    There is no disc space narrowing or spondylolisthesis. Extensive  calcification is present in the abdominal aorta and common iliac  arteries.     IMPRESSION:  New benign appearing compression fracture of the superior endplate  of L3. Old compression  fractures of T10 and T11.      Electronically Signed    By: Geanie Cooley M.D.    On: 05/02/2013 22:58     Verified By: Gwynn Burly, M.D.,    No Known Allergies:   Vital Signs/Nurse's Notes:  **Vital Signs.:   10-Mar-15 11:19  Vital Signs Type Routine  Temperature Temperature (F) 98  Celsius 36.6  Temperature Source oral  Pulse Pulse 64  Respirations Respirations 18  Systolic BP Systolic BP 127  Diastolic BP (mmHg) Diastolic BP (mmHg) 54  Mean BP 78  Pulse Ox % Pulse Ox % 94  Pulse Ox Activity Level  At rest  Oxygen Delivery Room Air/ 21 %  *Intake and Output.:   Shift 10-Mar-15 15:00  Grand Totals Intake:   Output:  700    Net:  -700 24 Hr.:  -700  Urine ml     Out:  700  Length of Stay Totals Intake:  360 Output:  1900    Net:  -1540    Impression 81yo female w/ PMHx s/f chronic diastolic CHF, severe aortic stenosis, PAF, chronic Coumadin anticoagulation, CKD (stage III), HTN, HLD and chronic LE edema who was admitted to [facility] yesterday w/ A/C diastolic CHF. Cardiology consulted for the same.  1. Acute on chronic diastolic CHF NYHA class III symptoms at baseline, progressing to NYHA class IV- resting dyspnea, hypoxic, weight increase. Suspect low back pain +/- hypertension, salt indiscretion (microwave dinners) and lack of extra Lasix PRN contributing to decompensation.  CXR w/ interstitial edema. BNP elevated yesterday. Responded well to Lasix 60mg  IV BID. Total I/O - 1.5 L. Small Cr bump this AM. Weight 151 lbs today.  -- Transition back to Lasix PO + KCl -- Needs to watch salt intake. Would encourage Lasix PRN use for SOB, wheezing, weight gain, hypoxia. Issues with urinary incontinence with this.  -- Control BP -- See below for AS  2. Vertebral fracture New benign appearing compression fracture of the superior endplate of L3. Old compression fractures of T10 and T11 per radiograph. -- PCP to coordinate ortho referral  3. Severe aortic valve  stenosis Asymptomatic at this time. Alternative factors contributing to decompensation.  -- Continue to monitor AV.  -- If continues to have CHF decompensation despite sodium restriction, pain control from compression fx, will need  to refer earlier for possible TAVR. -- Discuss holding BB in the settiong of severe AS w/ MD --Will arrange outpt cardiac cath after follow up with me in clinic as part of preop for TAVR  4. PAF Maintaining NSR. INR therapeutic yesterday and today.  -- Continue Rythmol and Coumadin\ -- Discuss continuing Toprol-XL w/ MD given severe AS  5. Hypertension SBP 160s in the ED yesterday. Low back pain, anxiety likely contributing. Better controlled this AM.  -- Continue antihypertensives.   Plan 6. CKD, stage III Mild Cr bump this AM.  -- Transition to Lasix PO w/ renal function monitoring -- Avoid nephrotoxic meds  7. HLD -- Continue statin   Electronic Signatures: Josepha Barbier A (PA-C)  (Signed 10-Mar-15 10:26)  Authored: General Aspect/Present Illness, History and Physical Exam, Review of System, Home Medications, Labs, EKG , Radiology, Allergies, Vital Signs/Nurse's Notes, Impression/Plan Julien Nordmann (MD)  (Signed 10-Mar-15 17:21)  Authored: Review of System, Radiology, Vital Signs/Nurse's Notes, Impression/Plan  Co-Signer: General Aspect/Present Illness, History and Physical Exam, Review of System, Home Medications, Labs, EKG , Radiology, Allergies, Vital Signs/Nurse's Notes, Impression/Plan   Last Updated: 10-Mar-15 17:21 by Julien Nordmann (MD)

## 2014-06-17 NOTE — Consult Note (Signed)
Brief Consult Note: Diagnosis: right intertrochanteric fracture.   Patient was seen by consultant.   Recommend to proceed with surgery or procedure.   Orders entered.   Comments: rec ORIF tomorrow if medically ok.  Electronic Signatures: Leitha Schuller (MD)  (Signed 17-Mar-15 18:56)  Authored: Brief Consult Note   Last Updated: 17-Mar-15 18:56 by Leitha Schuller (MD)

## 2014-06-17 NOTE — Discharge Summary (Signed)
Dates of Admission and Diagnosis:  Date of Admission 02-May-2013   Date of Discharge 04-May-2013   Admitting Diagnosis ac on ch diastolic heart failure   Final Diagnosis Ac on Ch Diastolic CHF A fib Aortic valve stenosis L3 compression fracture    Chief Complaint/History of Present Illness An 81 year old female patient with history of diastolic heart failure who comes in because of shortness of breath, wheezing and she checked her O2 sats at home; it was 84% on room air. Because of that, she came to the hospital. In the ER, the patient was found to have a BNP of 5030, and we are admitting her for acute on chronic diastolic heart failure. The patient says that she is having some trouble breathing for 2 to 3 days, also associated with wheezing and also some cough. The patient denies any chest pain. The patient denies any orthopnea or PND. The patient has other complaints of low back pain going on for about 10 days. The patient was having back pain for about 1 week. Seen in the Emergency Room on March 4th. Discharged home with pain medications. The patient has an appointment with Dr. Derrel Nip on Wednesday to get an orthopedic appointment.  The patient right now has back pain in lower back, around 7/10 in severity and not radiating to the legs. No numbness in the legs, and the patient denies any history of fall or trauma to the back. The patient did have a hip x-rays  on March 4th but did not have any x-rays of the back. The patient denies any other complaints and she says her back pain is a little better.   Allergies:  No Known Allergies:   Routine Chem:  09-Mar-15 17:32   Glucose, Serum 87  BUN  39  Creatinine (comp)  1.43  Sodium, Serum 140  Potassium, Serum 4.5  Chloride, Serum 106  CO2, Serum 30  Calcium (Total), Serum 9.4  Anion Gap  4  Osmolality (calc) 288  eGFR (African American)  38  eGFR (Non-African American)  33 (eGFR values <58m/min/1.73 m2 may be an indication of  chronic kidney disease (CKD). Calculated eGFR is useful in patients with stable renal function. The eGFR calculation will not be reliable in acutely ill patients when serum creatinine is changing rapidly. It is not useful in  patients on dialysis. The eGFR calculation may not be applicable to patients at the low and high extremes of body sizes, pregnant women, and vegetarians.)  B-Type Natriuretic Peptide ([facility]  5030 (Result(s) reported on 02 May 2013 at 06:07PM.)  10-Mar-15 05:24   Glucose, Serum 74  BUN  34  Creatinine (comp)  1.48  Sodium, Serum 143  Potassium, Serum 3.8  Chloride, Serum 107  CO2, Serum 30  Calcium (Total), Serum  8.3  Anion Gap  6  Osmolality (calc) 291  eGFR (African American)  37  eGFR (Non-African American)  32 (eGFR values <652mmin/1.73 m2 may be an indication of chronic kidney disease (CKD). Calculated eGFR is useful in patients with stable renal function. The eGFR calculation will not be reliable in acutely ill patients when serum creatinine is changing rapidly. It is not useful in  patients on dialysis. The eGFR calculation may not be applicable to patients at the low and high extremes of body sizes, pregnant women, and vegetarians.)  Cardiac:  09-Mar-15 17:32   Troponin I < 0.02 (0.00-0.05 0.05 ng/mL or less: NEGATIVE  Repeat testing in 3-6 hrs  if clinically indicated. >0.05 ng/mL: POTENTIAL  MYOCARDIAL INJURY. Repeat  testing in 3-6 hrs if  clinically indicated. NOTE: An increase or decrease  of 30% or more on serial  testing suggests a  clinically important change)  Routine Coag:  09-Mar-15 17:32   Prothrombin  22.7  INR 2.1 (INR reference interval applies to patients on anticoagulant therapy. A single INR therapeutic range for coumarins is not optimal for all indications; however, the suggested range for most indications is 2.0 - 3.0. Exceptions to the INR Reference Range may include: Prosthetic heart valves, acute myocardial  infarction, prevention of myocardial infarction, and combinations of aspirin and anticoagulant. The need for a higher or lower target INR must be assessed individually. Reference: The Pharmacology and Management of the Vitamin K  antagonists: the seventh ACCP Conference on Antithrombotic and Thrombolytic Therapy. PRFFM.3846 Sept:126 (3suppl): [id number]. A HCT value >55% may artifactually increase the PT.  In one study,  the increase was an average of 25%. Reference:  "Effect on Routine and Special Coagulation Testing Values of Citrate Anticoagulant Adjustment in Patients with High HCT Values." American Journal of Clinical Pathology 2006;126:400-405.)  10-Mar-15 05:24   Prothrombin  23.1  INR 2.1 (INR reference interval applies to patients on anticoagulant therapy. A single INR therapeutic range for coumarins is not optimal for all indications; however, the suggested range for most indications is 2.0 - 3.0. Exceptions to the INR Reference Range may include: Prosthetic heart valves, acute myocardial infarction, prevention of myocardial infarction, and combinations of aspirin and anticoagulant. The need for a higher or lower target INR must be assessed individually. Reference: The Pharmacology and Management of the Vitamin K  antagonists: the seventh ACCP Conference on Antithrombotic and Thrombolytic Therapy. [id number] Sept:126 (3suppl): [id number]. A HCT value >55% may artifactually increase the PT.  In one study,  the increase was an average of 25%. Reference:  "Effect on Routine and Special Coagulation Testing Values of Citrate Anticoagulant Adjustment in Patients with High HCT Values." American Journal of Clinical Pathology 2006;126:400-405.)  Routine Hem:  09-Mar-15 17:32   WBC (CBC) 8.1  RBC (CBC)  3.78  Hemoglobin (CBC) 12.1  Hematocrit (CBC) 35.8  Platelet Count (CBC) 195  MCV 95  MCH 32.0  MCHC 33.9  RDW  14.6  Neutrophil % 76.8  Lymphocyte % 12.4  Monocyte % 6.5   Eosinophil % 3.3  Basophil % 1.0  Neutrophil # 6.3  Lymphocyte # 1.0  Monocyte # 0.5  Eosinophil # 0.3  Basophil # 0.1 (Result(s) reported on 02 May 2013 at 05:47PM.)  10-Mar-15 05:24   WBC (CBC) 6.9  RBC (CBC)  3.41  Hemoglobin (CBC)  11.2  Hematocrit (CBC)  32.2  Platelet Count (CBC) 187  MCV 94  MCH 32.8  MCHC 34.7  RDW  15.0  Neutrophil % 68.0  Lymphocyte % 17.7  Monocyte % 8.5  Eosinophil % 4.4  Basophil % 1.4  Neutrophil # 4.7  Lymphocyte # 1.2  Monocyte # 0.6  Eosinophil # 0.3  Basophil # 0.1 (Result(s) reported on 03 May 2013 at 05:57AM.)   PERTINENT RADIOLOGY STUDIES: XRay:    09-Mar-15 17:53, Chest PA and Lateral  Chest PA and Lateral   REASON FOR EXAM:    dyspnea  COMMENTS:       PROCEDURE: DXR - DXR CHEST PA (OR AP) AND LATERAL  - May 02 2013  5:53PM     CLINICAL DATA:  Shortness of breath    EXAM:  CHEST  2 VIEW    COMPARISON:  DG  CHEST 2V dated 07/20/2012; DG CHEST 2V dated  07/17/2012    FINDINGS:  Hyperinflation and prominence of the reticular markings compatible  with emphysema reidentified. Heart size is mildly enlarged with  prominence of the main pulmonary arteries which may suggest  pulmonary arterial hypertension. Trace if any right pleural  effusion. There is a suggestion of a few Kerley B-lines at the lung  bases. No acute osseous abnormality.     IMPRESSION:  Moderate cardiomegaly with emphysema and possible superimposed early  interstitial edema.      Electronically Signed    By: Conchita Paris M.D.    On: 05/02/2013 18:02     Verified By: Arline Asp, M.D.,   Pertinent Past History:  Pertinent Past History PAST MEDICAL HISTORY: Significant for hypertension, history of diastolic heart failure, aortic stenosis, chronic atrial fibrillation. The patient also h as hyperlipidemia. Follows up with Dr. Rockey Situ for CHF and atrial fibrillation.    PAST SURGICAL HISTORY: Significant for cholecystectomy.   Hospital Course:   Hospital Course 1. Acute on chronic diastolic heart failure and possible mild chronic obstructive pulmonary disease flare. on telemetry.  IV Lasix at 60 mg q.12 hours, now will swich to home dose as stable. appreciated consult with Dr. Chryl Heck from cardiology point. 2.  History of aortic stenosis. The patient is on beta blockers.need to folow in [place] with Dr. Rockey Situ for further work up or treatment.  3.  History of atrial fibrillation. INR is therapeutic. Continue Coumadin at 2 mg.  4.  The patient has acute low back pain.  x-ray of lumbosacral spine- compression fracture of L3, continue her pain medication with oxycodone and appreciated ortho consult. 5.  Insomnia. Continue trazodone.   Condition on Discharge Stable   Code Status:  Code Status Full Code   DISCHARGE INSTRUCTIONS HOME MEDS:  Medication Reconciliation: Patient's Home Medications at Discharge:     Medication Instructions  propafenone 225 mg oral tablet  1 tab(s) orally 3 times a day    allopurinol 100 mg oral tablet  1 tab(s) orally once a day   klor-con m20 20 meq oral tablet, extended release  1 tab(s) orally once a day   metoprolol succinate 50 mg oral tablet, extended release  1 tab(s) orally once a day   pramipexole 0.25 mg oral tablet  1 tab(s) orally once a day (at bedtime)   trazodone 50 mg oral tablet  0.5 tab (25 mg) orally once a day (at bedtime) as needed for sleep   warfarin 2 mg oral tablet  1 tab(s) orally once a day (at bedtime)   amlodipine 5 mg oral tablet  1 tab(s) orally once a day   crestor 10 mg oral tablet  1 tab(s) orally once a day (at bedtime)   ocuvite preservision antioxidant multiple vitamins and minerals oral tablet  1 tab(s) orally once a day   tramadol 50 mg oral tablet  1 tab(s) orally 1 to 3 times a day, As Needed, pain , As needed, pain   furosemide 40 mg oral tablet  1 tab(s) orally once a day    STOP TAKING THE FOLLOWING MEDICATION(S):    oxycodone 5 mg oral tablet: 1  tab(s) orally every 6 hours  Physician's Instructions:  Home Health? Yes   [place] [facility]  Nurse   Diet Low Sodium   Activity Limitations As tolerated   Return to Work Not Applicable   Time frame for Follow Up Appointment 1-2 weeks  Dr. Rockey Situ   Time frame for Follow Up Appointment 1-2 weeks  Ortho clinic.   Electronic Signatures: Vaughan Basta (MD)  (Signed 14-Mar-15 04:39)  Authored: ADMISSION DATE AND DIAGNOSIS, CHIEF COMPLAINT/HPI, Allergies, PERTINENT LABS, PERTINENT RADIOLOGY STUDIES, PERTINENT PAST HISTORY, HOSPITAL COURSE, DISCHARGE INSTRUCTIONS HOME MEDS, PATIENT INSTRUCTIONS   Last Updated: 14-Mar-15 04:39 by Vaughan Basta (MD)

## 2014-06-17 NOTE — Consult Note (Signed)
PATIENT NAME:  Madeline, Perez MR#:  [id number] DATE OF BIRTH:  [birth year]  DATE OF CONSULTATION:  05/10/2013  REFERRING PHYSICIAN:   CONSULTING PHYSICIAN:  Leitha Schuller, MD  REASON FOR CONSULT: Right hip fracture.   HISTORY OF PRESENT ILLNESS: The patient is an 81 year old female who suffered a fall the day of admission. She was then unable to bear weight and was brought to the Emergency Room, where she was found to have a significant comminuted fracture. She has a comminuted fracture with the lesser trochanter fractured. She has been fairly active, walking without assistive device. She has a history of CHF as well as significant aortic stenosis and cardiac disease.   ON EXAM: She is neurovascularly intact to the right foot. She is able to flex and extend the toes. Skin is intact about the hip. Leg is shortened and externally rotated.   X-rays again, comminuted, unstable hip fracture.   PLAN: For ORIF when medically cleared. Right now, she has been on Coumadin and has significant cardiac risks for operative intervention. Will await medical clearance prior to consideration of ORIF with intramedullary device. I discussed the procedure with her at length.   ____________________________ Leitha Schuller, MD mjm:cs D: 05/10/2013 20:44:56 ET T: 05/10/2013 20:57:43 ET JOB#: [id number]  cc: Leitha Schuller, MD, <Dictator> Leitha Schuller MD ELECTRONICALLY SIGNED 05/10/2013 23:05

## 2014-10-09 IMAGING — CR DG CHEST 1V PORT
1 series · 1 of 1 positions shown · non-contrast
Comparison: none

REASON FOR EXAM: low SAT
COMMENTS:

PROCEDURE:     DXR - DXR PORTABLE CHEST SINGLE VIEW  - December 07, 2011 [DATE]
RESULT:     Comparison: 07/17/2011

[ap]
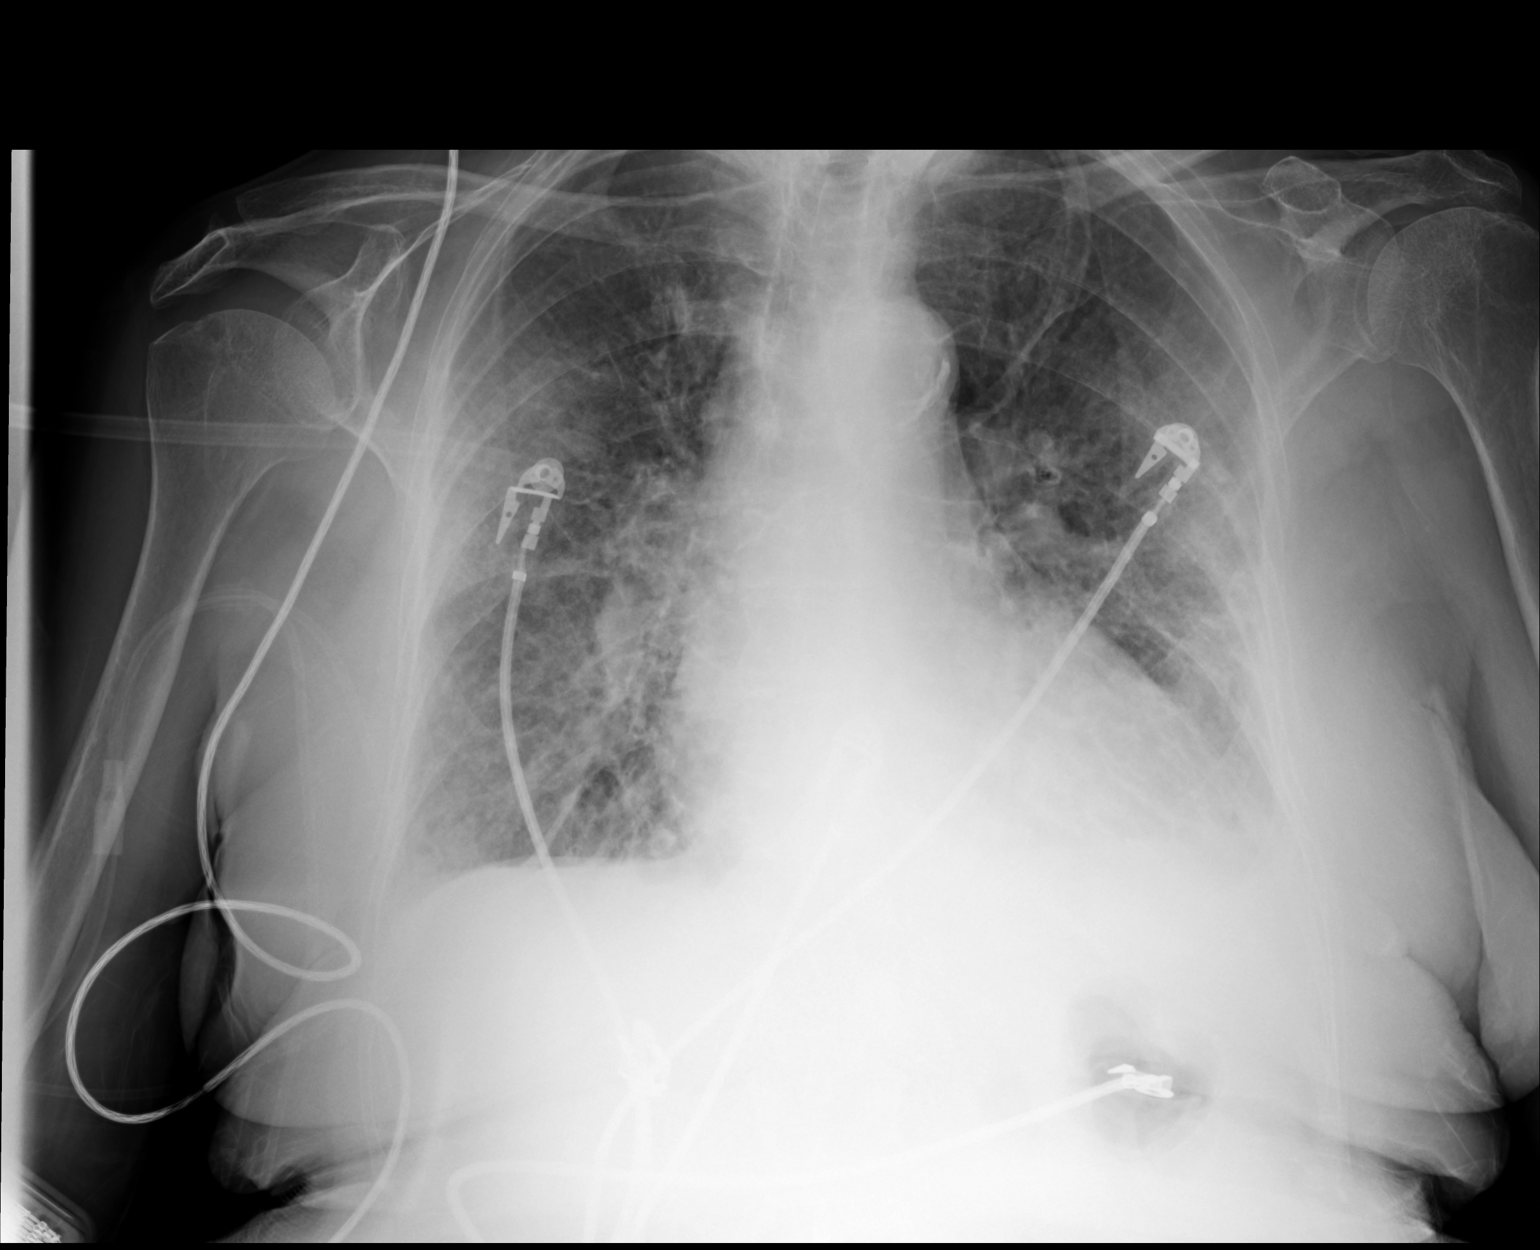

[1 of 1 positions shown; findings below may reference images not displayed]

FINDINGS: The patient is rotated to the left. Heart size upper limits normal, similar
to prior. There are bilateral heterogeneous, predominantly interstitial,
pulmonary opacities. There may be small bilateral pleural effusions.
IMPRESSION: Findings which likely represent pulmonary edema.

[REDACTED]

## 2014-10-10 IMAGING — CR DG CHEST 2V
1 series · 3 of 3 positions shown · non-contrast
Comparison: none

REASON FOR EXAM: respiaratory failure
COMMENTS:

[Series 1: ap · 0.17mm/px · 3 of 3 slices shown]
[im 1/3]
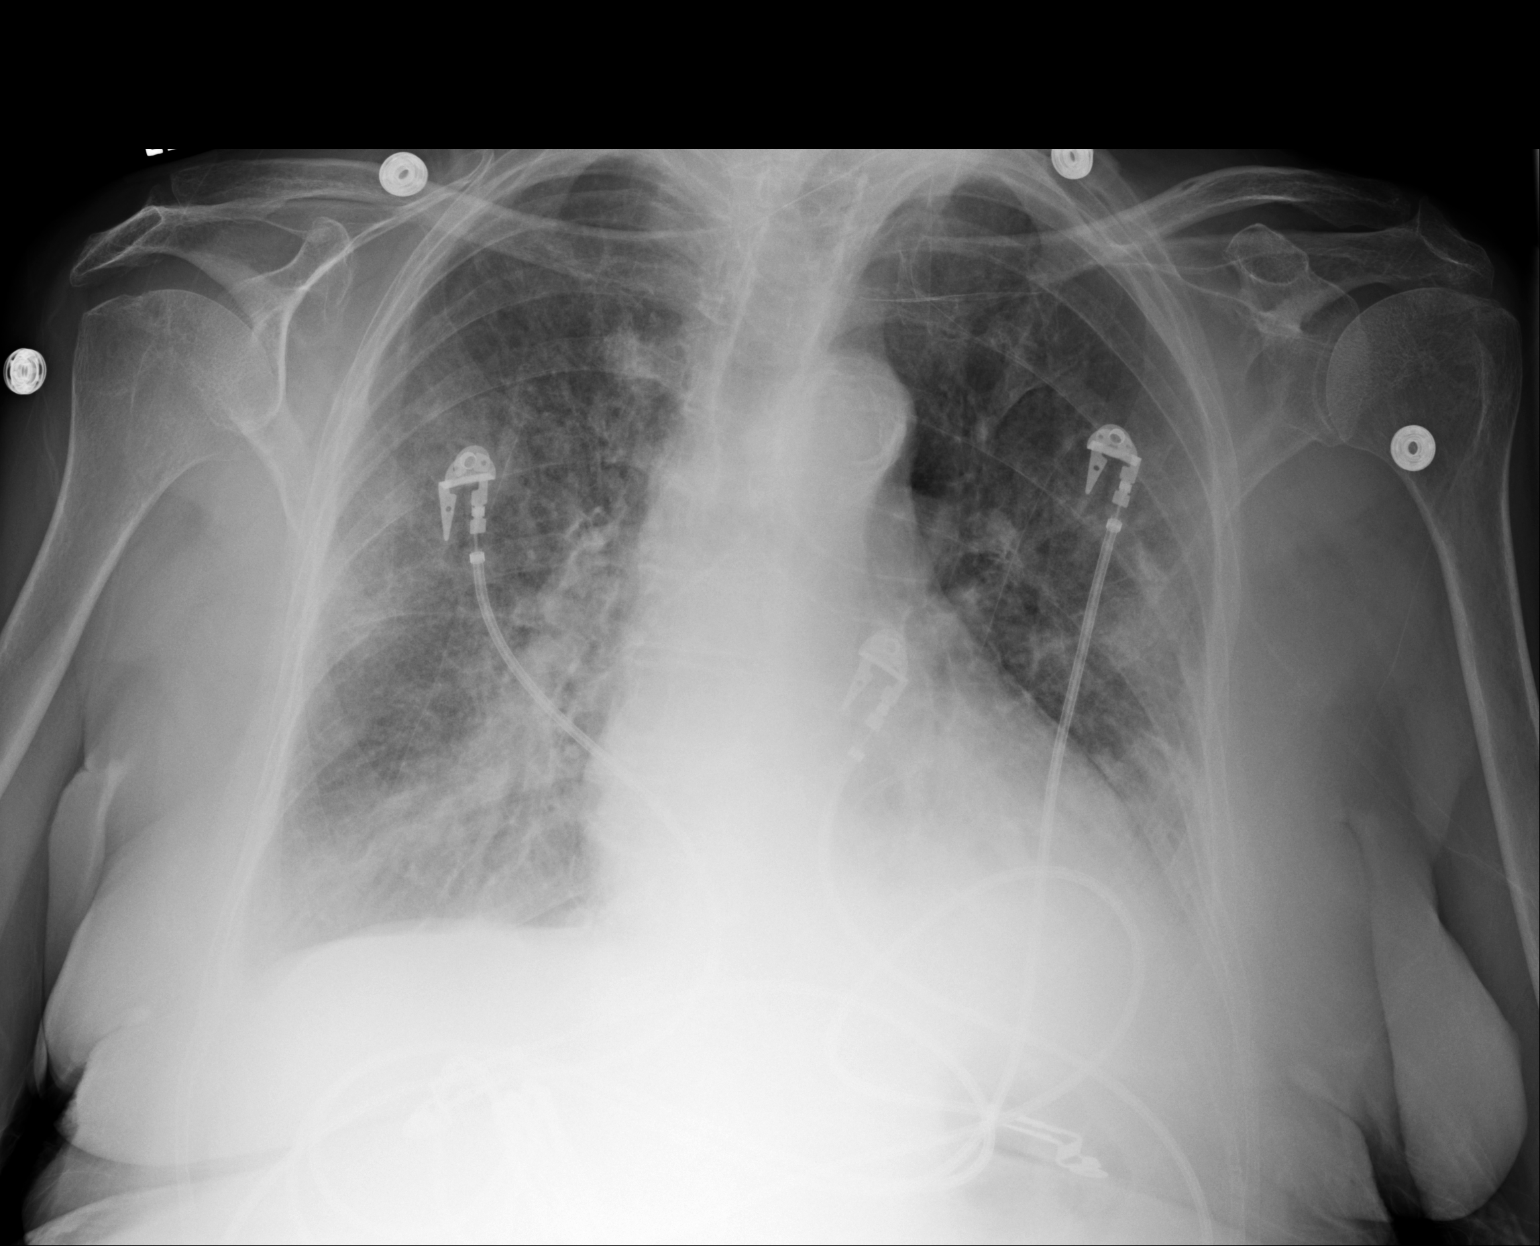
[im 2/3]
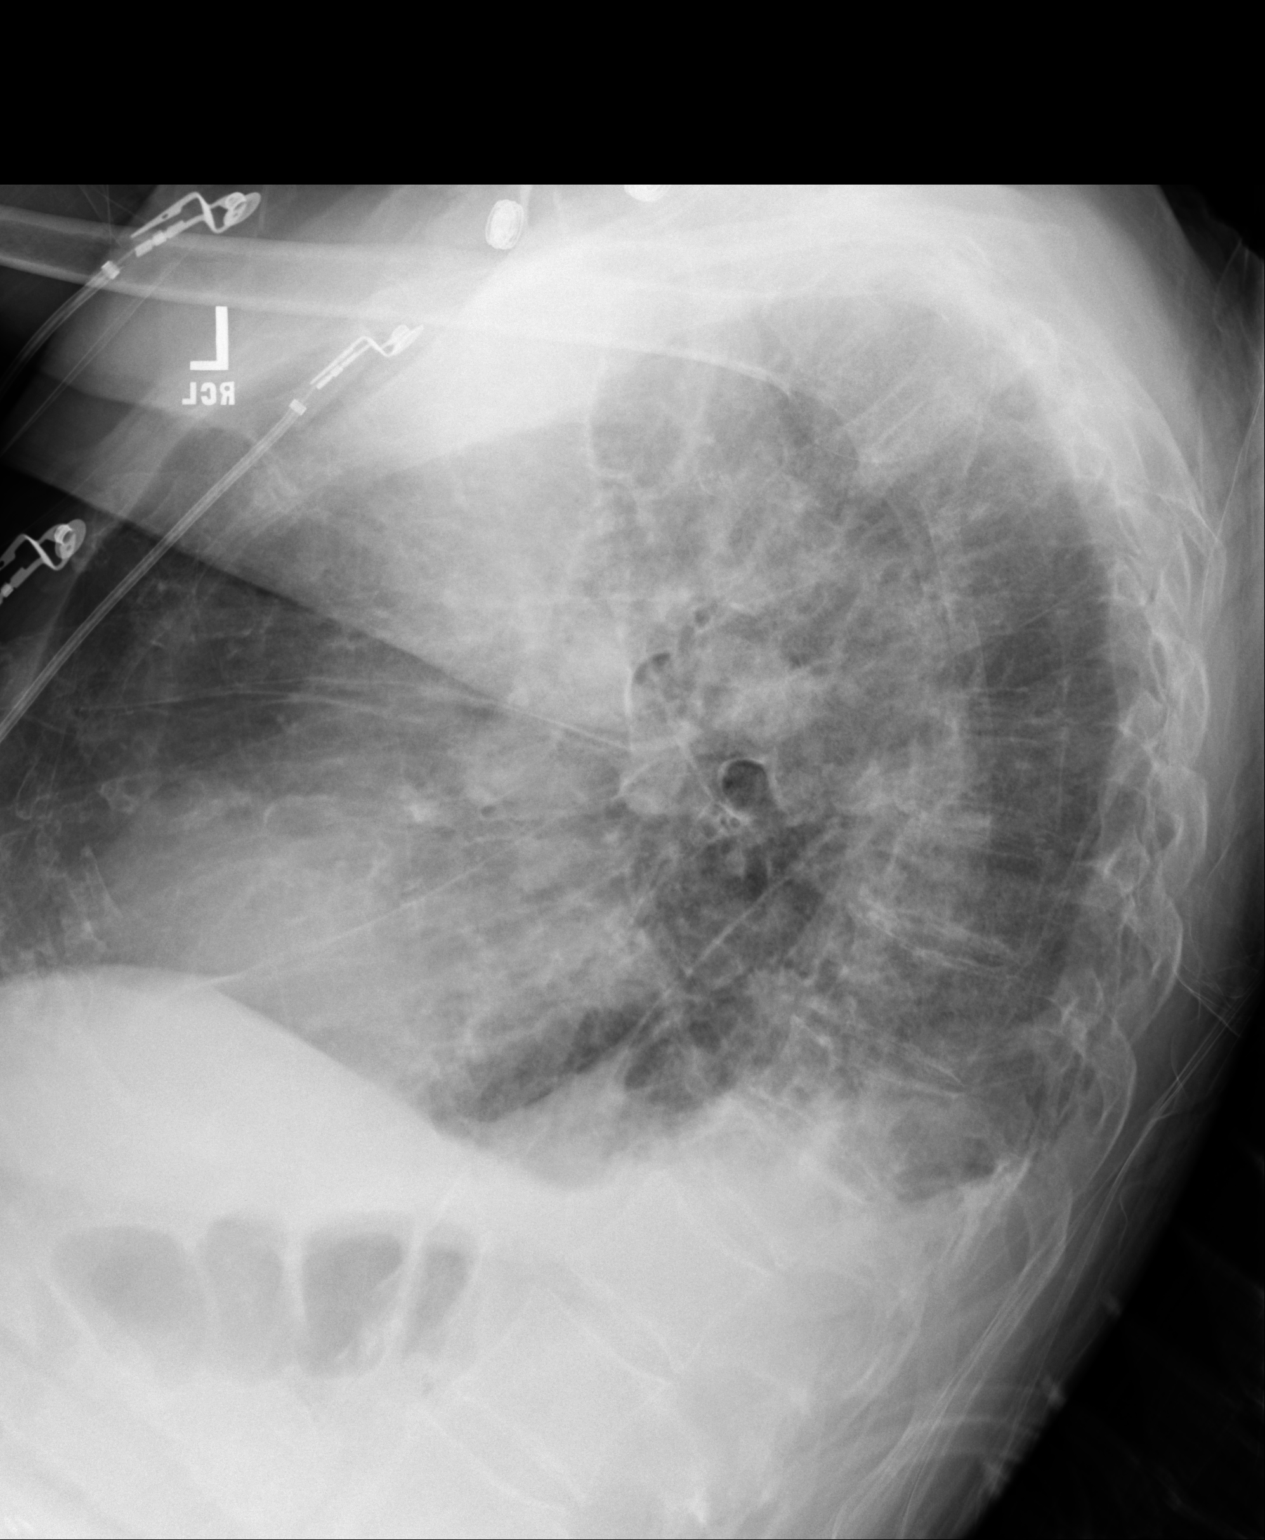
[im 3/3]
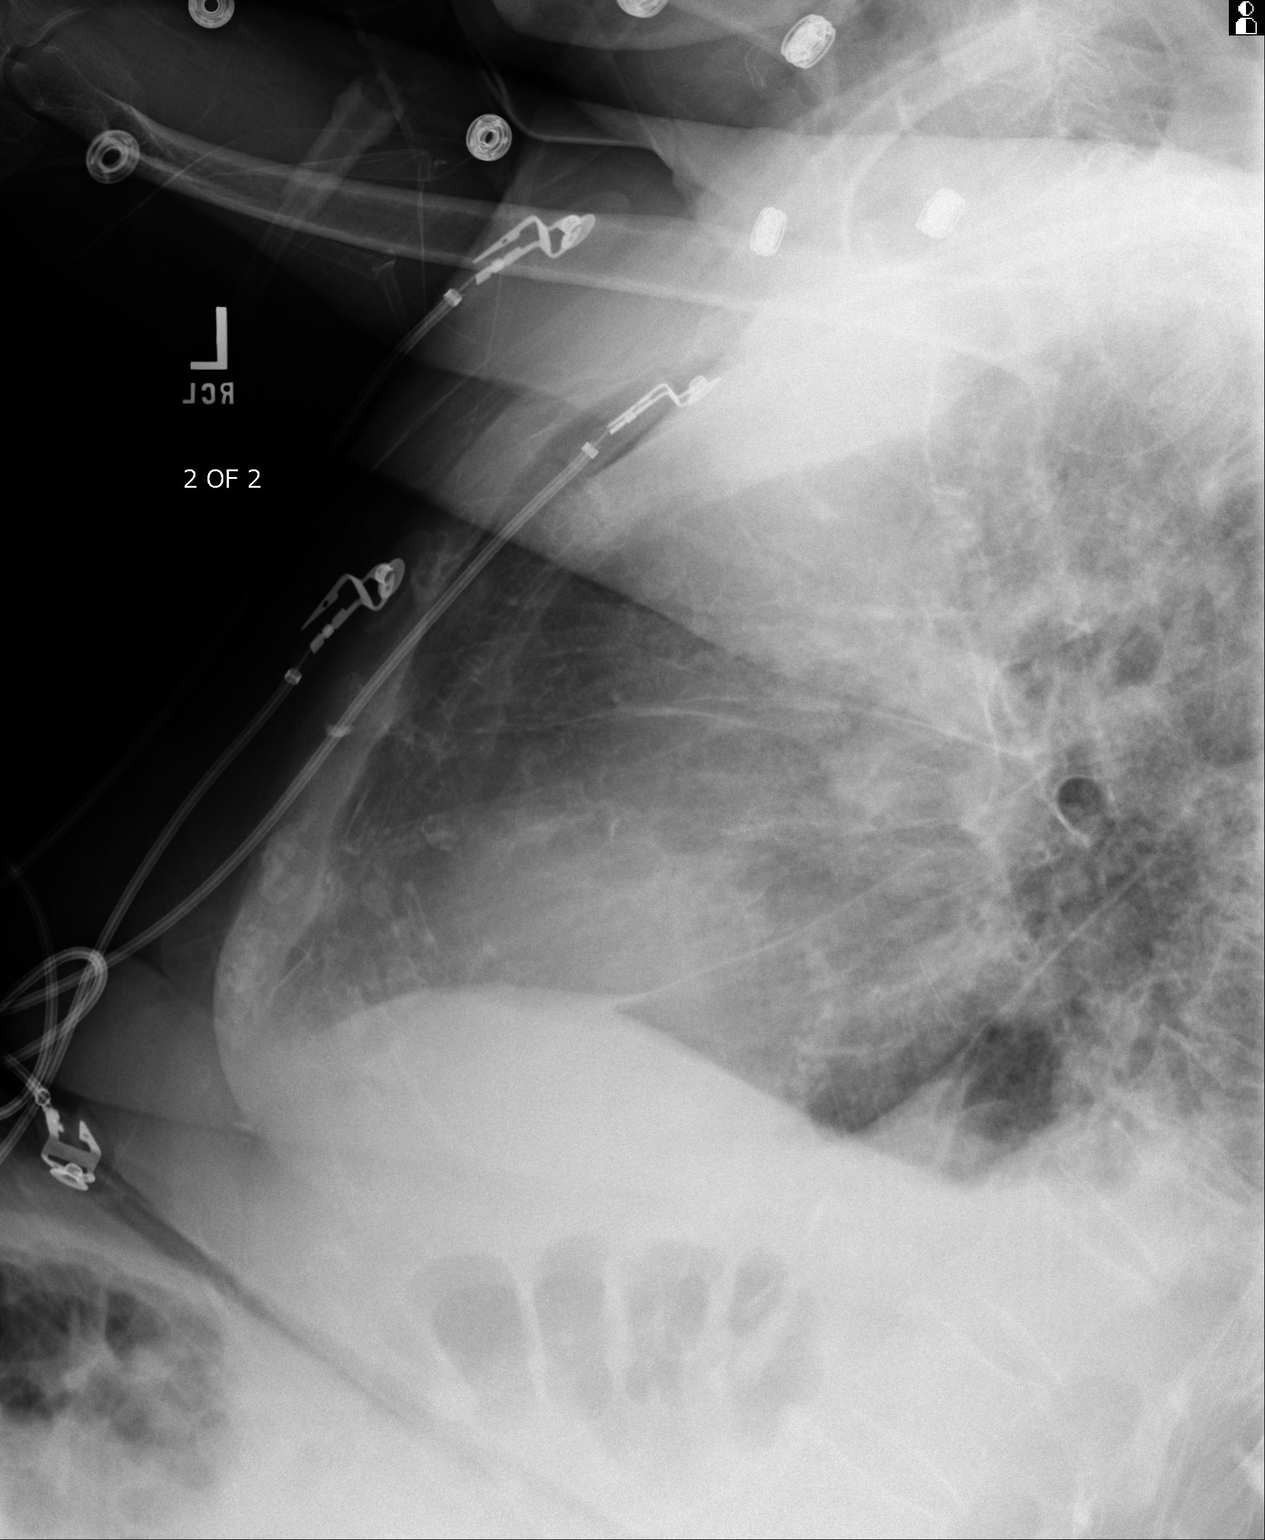

[3 of 3 positions shown; findings below may reference images not displayed]

PROCEDURE:     DXR - DXR CHEST PA (OR AP) AND LATERAL  - December 08, 2011  [DATE]

RESULT:     Comparison is made to the most recent study 07 December, 2010.

There is patchy increased density in both lungs is an underlying pattern of
possible COPD. There small bilateral effusions. Asymmetric edema versus
bilateral pneumonia are the most likely differential considerations. There
is significant worsening since June 2011 and worsening since the study December 07, 2011.
IMPRESSION: Worsening appearance of the chest findings most suggestive
of bilateral pneumonia or asymmetric edema. Correlate with clinical and
laboratory data.

[REDACTED]

## 2016-11-20 NOTE — Telephone Encounter (Signed)
Error
